# Patient Record
Sex: Male | Born: 1955
Health system: Southern US, Community
[De-identification: ages and names within clinical notes are randomized; demographics above are authoritative.]

## PROBLEM LIST (undated history)

## (undated) VITALS — BP 133/91 | HR 88 | Temp 97.3°F | Resp 18 | Ht 71.0 in | Wt 178.0 lb

## (undated) DIAGNOSIS — N200 Calculus of kidney: Secondary | ICD-10-CM

## (undated) DIAGNOSIS — G459 Transient cerebral ischemic attack, unspecified: Secondary | ICD-10-CM

## (undated) DIAGNOSIS — F32A Depression, unspecified: Secondary | ICD-10-CM

## (undated) DIAGNOSIS — R51 Headache: Secondary | ICD-10-CM

## (undated) DIAGNOSIS — F329 Major depressive disorder, single episode, unspecified: Secondary | ICD-10-CM

## (undated) HISTORY — PX: ROTATOR CUFF REPAIR: SHX139

## (undated) HISTORY — PX: HERNIA REPAIR: SHX51

---

## 2006-01-09 ENCOUNTER — Emergency Department (HOSPITAL_COMMUNITY): Admission: EM | Admit: 2006-01-09 | Discharge: 2006-01-09 | Payer: Self-pay | Admitting: Emergency Medicine

## 2009-04-14 ENCOUNTER — Encounter: Payer: Self-pay | Admitting: Family Medicine

## 2009-05-20 ENCOUNTER — Encounter: Payer: Self-pay | Admitting: Family Medicine

## 2009-05-20 LAB — CONVERTED CEMR LAB
Cholesterol: 199 mg/dL
HDL: 58 mg/dL
LDL Cholesterol: 136 mg/dL
PSA: 0.5 ng/mL

## 2009-07-08 ENCOUNTER — Emergency Department (HOSPITAL_COMMUNITY): Admission: EM | Admit: 2009-07-08 | Discharge: 2009-07-08 | Payer: Self-pay | Admitting: Family Medicine

## 2009-08-07 ENCOUNTER — Encounter: Payer: Self-pay | Admitting: Family Medicine

## 2009-08-12 ENCOUNTER — Ambulatory Visit: Payer: Self-pay | Admitting: Family Medicine

## 2009-08-12 DIAGNOSIS — D126 Benign neoplasm of colon, unspecified: Secondary | ICD-10-CM | POA: Insufficient documentation

## 2009-08-18 ENCOUNTER — Encounter: Payer: Self-pay | Admitting: Family Medicine

## 2009-08-19 ENCOUNTER — Encounter: Payer: Self-pay | Admitting: Family Medicine

## 2009-08-19 DIAGNOSIS — E7800 Pure hypercholesterolemia, unspecified: Secondary | ICD-10-CM | POA: Insufficient documentation

## 2009-09-07 ENCOUNTER — Telehealth: Payer: Self-pay | Admitting: *Deleted

## 2009-11-18 ENCOUNTER — Telehealth: Payer: Self-pay | Admitting: Family Medicine

## 2010-02-06 ENCOUNTER — Emergency Department (HOSPITAL_COMMUNITY)
Admission: EM | Admit: 2010-02-06 | Discharge: 2010-02-06 | Payer: Self-pay | Source: Home / Self Care | Admitting: Emergency Medicine

## 2010-02-12 ENCOUNTER — Encounter: Payer: Self-pay | Admitting: Family Medicine

## 2010-02-12 ENCOUNTER — Ambulatory Visit
Admission: RE | Admit: 2010-02-12 | Discharge: 2010-02-12 | Payer: Self-pay | Source: Home / Self Care | Attending: Family Medicine | Admitting: Family Medicine

## 2010-02-12 DIAGNOSIS — R079 Chest pain, unspecified: Secondary | ICD-10-CM | POA: Insufficient documentation

## 2010-02-15 LAB — URINALYSIS, ROUTINE W REFLEX MICROSCOPIC
Bilirubin Urine: NEGATIVE
Ketones, ur: NEGATIVE mg/dL
Leukocytes, UA: NEGATIVE
Nitrite: NEGATIVE
Protein, ur: NEGATIVE mg/dL
Specific Gravity, Urine: 1.008 (ref 1.005–1.030)
Urine Glucose, Fasting: NEGATIVE mg/dL
Urobilinogen, UA: 0.2 mg/dL (ref 0.0–1.0)
pH: 6 (ref 5.0–8.0)

## 2010-02-15 LAB — BASIC METABOLIC PANEL
BUN: 16 mg/dL (ref 6–23)
CO2: 24 mEq/L (ref 19–32)
Calcium: 9.6 mg/dL (ref 8.4–10.5)
Chloride: 103 mEq/L (ref 96–112)
Creatinine, Ser: 1.01 mg/dL (ref 0.4–1.5)
GFR calc Af Amer: >60 mL/min (ref >60)
GFR calc non Af Amer: >60 mL/min (ref >60)
Glucose, Bld: 108 mg/dL — ABNORMAL HIGH (ref 70–99)
Potassium: 4.2 mEq/L (ref 3.5–5.1)
Sodium: 137 mEq/L (ref 135–145)

## 2010-02-15 LAB — URINE CULTURE
Colony Count: NO GROWTH
Culture  Setup Time: 201201071423
Culture: NO GROWTH

## 2010-02-15 LAB — DIFFERENTIAL
Basophils Absolute: 0 10*3/uL (ref 0.0–0.1)
Basophils Relative: 1 % (ref 0–1)
Eosinophils Absolute: 0.3 10*3/uL (ref 0.0–0.7)
Eosinophils Relative: 6 % — ABNORMAL HIGH (ref 0–5)
Lymphocytes Relative: 24 % (ref 12–46)
Lymphs Abs: 1.2 10*3/uL (ref 0.7–4.0)
Monocytes Absolute: 0.7 10*3/uL (ref 0.1–1.0)
Monocytes Relative: 13 % — ABNORMAL HIGH (ref 3–12)
Neutro Abs: 2.9 10*3/uL (ref 1.7–7.7)
Neutrophils Relative %: 57 % (ref 43–77)

## 2010-02-15 LAB — CBC
HCT: 45.5 % (ref 39.0–52.0)
Hemoglobin: 15.5 g/dL (ref 13.0–17.0)
MCH: 30.1 pg (ref 26.0–34.0)
MCHC: 34.1 g/dL (ref 30.0–36.0)
MCV: 88.3 fL (ref 78.0–100.0)
Platelets: 181 10*3/uL (ref 150–400)
RBC: 5.15 MIL/uL (ref 4.22–5.81)
RDW: 12.5 % (ref 11.5–15.5)
WBC: 5.1 10*3/uL (ref 4.0–10.5)

## 2010-02-15 LAB — CONVERTED CEMR LAB: Direct LDL: 117 mg/dL — ABNORMAL HIGH

## 2010-02-15 LAB — URINE MICROSCOPIC-ADD ON

## 2010-03-02 NOTE — Miscellaneous (Signed)
Summary: ROI  ROI   Imported By: Knox Royalty 08/22/2009 13:32:16  _____________________________________________________________________  External Attachment:    Type:   Image     Comment:   External Document

## 2010-03-02 NOTE — Assessment & Plan Note (Signed)
Summary: NEW PT/PHYSICAL/tcb   Vital Signs:  Patient profile:   55 year old male Height:      70 inches Weight:      187.7 pounds BMI:     27.03 Temp:     97.0 degrees F Pulse rate:   73 / minute BP sitting:   127 / 85  Vitals Entered By: Starleen Blue RN 07/13/2009 CC: np, Headache Is Patient Diabetic? No Pain Assessment Patient in pain? no        CC:  np and Headache.  History of Present Illness: Records have been requested. Not here yet. Colonoscopy needs fu 04/14/09 where polyps were found.  Biopsies taken.  We don't have specifics.  Likely needs another colonoscopy for polyp removal.   Had persistant low back and abd pain which has since resolved and now is pain free.  Did have  1. Small cyst liver on both ultrasound and CT 2.  Two other liver lesions seen only on CT. 3. Cyst Rt. pole of kidney on CT but not seen on ultrasound.   Will repeat Abd CT in Oct which will be 6 months after the initial one.    Scalp lesion told previously eczema.  Just had PSA and chol sreening - will wait for record.   Habits & Providers  Alcohol-Tobacco-Diet     Alcohol drinks/day: 1     Alcohol Counseling: not indicated; patient does not drink     Tobacco Status: quit     Year Quit: 2008     Diet Comments: very healthy  Exercise-Depression-Behavior     Does Patient Exercise: yes     Exercise Counseling: 8 hours a day through work     Have you felt down or hopeless? no     Have you felt little pleasure in things? no  Current Medications (verified): 1)  Maxalt 10 Mg Tabs (Rizatriptan Benzoate) .... One By Mouth At Onset of Headache. 2)  Aspirin 81 Mg Chew (Aspirin) .... One Daily  Allergies (verified): No Known Drug Allergies  Past History:  Family History: Last updated: 08/12/2009 Ca,  (colon and breast), OHD.  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   Also has adult child from first marriage.  Risk Factors: Alcohol Use: 1 (08/12/2009) Diet: very healthy (08/12/2009) Exercise: yes (08/12/2009)  Risk Factors: Smoking Status: quit (08/12/2009)  Past Medical History: Previous history of mild depression.  Tried on med and stopped due to side effects.  Now under good control  Past Surgical History: Os trigonum resection Lt ankle Left thumb skin graft from accident.  Family History: Ca,  (colon and breast), OHD.  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  Also has adult child from first marriage.Smoking Status:  quit Does Patient Exercise:  yes  Review of Systems  The patient denies chest pain, syncope, dyspnea on exertion, peripheral edema, abdominal pain, melena, suspicious skin lesions, depression, and unusual weight change.    Physical Exam  Mouth:  Oral mucosa and oropharynx without lesions or exudates.  Teeth in good repair. Lungs:  Normal respiratory effort, chest expands symmetrically. Lungs are clear to auscultation, no crackles or wheezes. Heart:  Normal rate and regular rhythm. S1 and S2 normal without gallop, murmur, click, rub or other extra sounds. Msk:  No deformity or scoliosis noted  of thoracic or lumbar spine.   Extremities:  No clubbing, cyanosis, edema, or deformity noted with normal full range of motion of all joints.   Neurologic:  No cranial nerve deficits noted. Station and gait are normal. Plantar reflexes are down-going bilaterally. DTRs are symmetrical throughout. Sensory, motor and coordinative functions appear intact.   Impression & Recommendations:  Problem # 1:  COLONIC POLYPS (ICD-211.3) Await records before scheduling repeat colonoscopy  Problem # 2:  Preventive Health Care (ICD-V70.0) Will review tetanus, lipids, PSA etc when old records come in.  He is at risk for CAD based on FHx Recommend ASA  daily.  Complete Medication List: 1)  Maxalt 10 Mg Tabs (Rizatriptan benzoate) .... One by mouth at onset of headache. 2)  Aspirin 81 Mg Chew (Aspirin) .... One daily  Patient Instructions: 1)  Check to make sure maxalt is 10 mg 2)  You will need repeat CT of abdomen in Oct.  Call my office in Sept and I will arrange.  Plan to take ultrasound CD with you to radiology. 3)  We will wait for records on tetanus shot.   Prevention & Chronic Care Immunizations   Influenza vaccine: Not documented    Tetanus booster: Not documented    Pneumococcal vaccine: Not documented  Colorectal Screening   Hemoccult: Not documented    Colonoscopy: Not documented  Other Screening   PSA: Not documented   Smoking status: quit  (08/12/2009)  Lipids   Total Cholesterol: Not documented   LDL: Not documented   LDL Direct: Not documented   HDL: Not documented   Triglycerides: Not documented

## 2010-03-02 NOTE — Letter (Signed)
Summary: Generic Letter  [facility]  [address]   [place], [place] [address]   Phone: [phone]  Fax: [phone]    08/19/2009  Harry Nash [address] [place], [place]  [address]  Dear Harry Nash,   I got your outside records and tried to call without success.  Issues are: 1. After again reviewing the CT and ultrasound, I think repeating the CT in Oct (6 months after the original) as we planned is fine. I sincerely doubt that any of the findings will turn out to be medically important. 2. The records did not include any immunization records. 3. The colonoscopy and pathology findings are a bit complicated and I will be happy to discuss in person.  Bottom line, I think you should have a repeat colonoscopy in the spring (1 year from previous.) 4. Your bad cholesterol was a bit high.  Your calculated risk for having a heart attack is 5% risk over the next 10 years.  I think you would benefit from treating your cholesterol with medications.    Call me to discuss.  Sincerely,      Harry Albino MD

## 2010-03-02 NOTE — Progress Notes (Signed)
Summary: triage  Phone Note Call from Patient Call back at [phone]   Caller: spouse-Jennifer Summary of Call: persistant cough and wants him to come in today b/c of body aches  Initial call taken by: De Nurse,  November 18, 2009 1:53 PM  Follow-up for Phone Call        spoke with wife. he is c/o persistant cough x 1 month. has a tooth abcess. dentist is calling in an antibiotic. has had cold symptoms that have not gone away. non-productive. denies fever. states he does not want to go to md. told her we have no appts left for today but they can go to UC if she can convince him. she will either do that or call for appt tomorrow Follow-up by: Golden Circle RN,  November 18, 2009 2:05 PM  Additional Follow-up for Phone Call Additional follow up Details #1::        noted.  Additional Follow-up by: Doralee Albino MD,  November 18, 2009 4:59 PM

## 2010-03-02 NOTE — Procedures (Signed)
Summary: [employer] [facility] [place]   Imported By: Knox Royalty 08/22/2009 13:33:39  _____________________________________________________________________  External Attachment:    Type:   Image     Comment:   External Document

## 2010-03-02 NOTE — Progress Notes (Signed)
Summary: Rx Req  Phone Note Call from Patient Call back at Home Phone [phone]   Caller: Patient Summary of Call: Pt states he was to have a migraine medication called in for him when he was here.  Maxalt is the name of the rx.  The pharmacy is [facility]. Initial call taken by: Clydell Hakim,  September 07, 2009 8:46 AM  Follow-up for Phone Call        Rx sent Follow-up by: Doralee Albino MD,  September 07, 2009 7:41 PM  Additional Follow-up for Phone Call Additional follow up Details #1::        Spoke with patient and informed him that [facility] Additional Follow-up by: Jimmy Footman, CMA,  September 08, 2009 8:37 AM    Prescriptions: MAXALT 10 MG TABS (RIZATRIPTAN BENZOATE) one by mouth at onset of headache.  #10 x 6   Entered and Authorized by:   Doralee Albino MD   Signed by:   Doralee Albino MD on 09/07/2009   Method used:   [facility]        [facility] [address] [phone]* (retail)       [address]       [place], [place]  [address]       Ph: [phone] or [phone]       Fax: [phone]   RxID:   [id number]

## 2010-03-02 NOTE — Letter (Signed)
Summary: [facility] [place] [facility]   Imported By: Clydell Hakim 08/28/2009 15:11:38  _____________________________________________________________________  External Attachment:    Type:   Image     Comment:   External Document

## 2010-03-02 NOTE — Miscellaneous (Signed)
Summary: Outside records  Clinical Lists Changes Due to complexity of colonoscopy and path report, will scan to chart.  As best I can tell, the biopsy of the ascending colon reveiling a tubular adenoma was from an ulcer rather than a polyp.  As such, there is no obvious polyp that currently needs removal.  Also, no dysplasia mentioned on the polyp.    Also, LDL is mildly elevated at 694.  His current 10 year cardiovascular risk is 5%.   Finally, will also scan recent CT and ultrasound of abd reports.  That radiologist recommended FU of two very small hepatic lesions seen only on CT in 1-2 months.  This seems to be an overly agressive recommendation and I will repeat scan in 6 months as initially planned. (Oct 2011) Observations: Added new observation of DM PROGRESS: N/A (08/18/2009 17:05) Added new observation of DM FSREVIEW: N/A (08/18/2009 17:05) Added new observation of HTN PROGRESS: N/A (08/18/2009 17:05) Added new observation of HTN FSREVIEW: N/A (08/18/2009 17:05) Added new observation of LIPID PROGRS: N/A (08/18/2009 17:05) Added new observation of LIPID FSREVW: N/A (08/18/2009 17:05) Added new observation of LDL: 136 mg/dL (85/46/2703 50:09) Added new observation of HDL: 58 mg/dL (38/18/2993 71:69) Added new observation of CHOLESTEROL: 199 mg/dL (67/89/3810 17:51) Added new observation of PSA: 0.5 ng/mL (05/20/2009 17:05) Added new observation of COLONOSCOPY: Results: Polyp.  Biopsy showed  Ascending colon tubular adenoma  7mm in ascending colon Sigmoid and rectum hyperplastic polyps diverticulosis  (04/14/2009 17:20)      Prevention & Chronic Care Immunizations   Influenza vaccine: Not documented    Tetanus booster: Not documented    Pneumococcal vaccine: Not documented  Colorectal Screening   Hemoccult: Not documented    Colonoscopy: Results: Polyp.  Biopsy showed  Ascending colon tubular adenoma  7mm in ascending colon Sigmoid and rectum hyperplastic  polyps diverticulosis  (04/14/2009)  Other Screening   PSA: 0.5  (05/20/2009)   Smoking status: quit  (08/12/2009)  Lipids   Total Cholesterol: 199  (05/20/2009)   LDL: 136  (05/20/2009)   LDL Direct: Not documented   HDL: 58  (05/20/2009)   Triglycerides: Not documented    Colonoscopy  Procedure date:  04/14/2009  Findings:      Results: Polyp.  Biopsy showed  Ascending colon tubular adenoma  7mm in ascending colon Sigmoid and rectum hyperplastic polyps diverticulosis   Colonoscopy  Procedure date:  04/14/2009  Findings:      Results: Polyp.  Biopsy showed  Ascending colon tubular adenoma  7mm in ascending colon Sigmoid and rectum hyperplastic polyps diverticulosis  Appended Document: Outside records    Clinical Lists Changes  Problems: Changed problem from COLONIC POLYPS (ICD-211.3) to COLONIC POLYPS, ADENOMATOUS (ICD-211.3) Added new problem of HYPERCHOLESTEROLEMIA (ICD-272.0) Observations: Added new observation of HEMOCCRECACT: Not indicated (08/19/2009 9:57) Added new observation of COLONNXTDUE: 04/15/2010 (08/19/2009 9:57) Added new observation of DM PROGRESS: N/A (08/19/2009 9:57) Added new observation of DM FSREVIEW: N/A (08/19/2009 9:57) Added new observation of HTN PROGRESS: N/A (08/19/2009 9:57) Added new observation of HTN FSREVIEW: N/A (08/19/2009 9:57)       Prevention & Chronic Care Immunizations   Influenza vaccine: Not documented    Tetanus booster: Not documented    Pneumococcal vaccine: Not documented  Colorectal Screening   Hemoccult: Not documented   Hemoccult action/deferral: Not indicated  (08/19/2009)    Colonoscopy: Results: Polyp.  Biopsy showed  Ascending colon tubular adenoma  7mm in ascending colon Sigmoid and rectum hyperplastic polyps diverticulosis  (04/14/2009)  Colonoscopy due: 04/15/2010  Other Screening   PSA: 0.5  (05/20/2009)   Smoking status: quit  (08/12/2009)  Lipids   Total Cholesterol:  199  (05/20/2009)   LDL: 136  (05/20/2009)   LDL Direct: Not documented   HDL: 58  (05/20/2009)   Triglycerides: Not documented    SGOT (AST): Not documented   SGPT (ALT): Not documented   Alkaline phosphatase: Not documented   Total bilirubin: Not documented  Self-Management Support :    Lipid self-management support: Not documented

## 2010-03-04 NOTE — Letter (Signed)
Summary: *Referral Letter  [facility]  [address]   [place], [place] [address]   Phone: [phone]  Fax: [phone]    02/12/2010  Thank you in advance for agreeing to see my patient:  Harry Nash [address] [place], [place]  [address]  Phone: [phone]  Reason for Referral: Needs repeat colonoscopy.  Procedures Requested: Mildly confusing previous colonoscopy - copies attached.  Also had abnormal CT of abd.  Recent CT when he had kidney stones showed: Adrenal adenoma and Renal cyst stable Possible liver lesions and sigmoid thickening not present. Given previous colonoscopy findings feel repeat test at this time appropriate.  Current Medical Problems: 1)  CHEST PAIN, UNSPECIFIED (ICD-786.50) 2)  HYPERCHOLESTEROLEMIA (ICD-272.0) 3)  COLONIC POLYPS, ADENOMATOUS (ICD-211.3)   Current Medications: 1)  MAXALT 10 MG TABS (RIZATRIPTAN BENZOATE) one by mouth at onset of headache. 2)  ASPIRIN 81 MG CHEW (ASPIRIN) one daily   Past Medical History: 1)  Previous history of mild depression.  Tried on med and stopped due to side effects.  Now under good control    Thank you again for agreeing to see our patient; please contact us if you have any further questions or need additional information.  Sincerely,    Doralee Albino MD

## 2010-03-04 NOTE — Assessment & Plan Note (Signed)
Summary: f/u test results/eo   Vital Signs:  Patient profile:   55 year old male Height:      70 inches Weight:      186.7 pounds BMI:     26.89 Temp:     97.7 degrees F oral Pulse rate:   84 / minute BP sitting:   129 / 80  (left arm) Cuff size:   regular  Vitals Entered By: Jimmy Footman, CMA (February 12, 2010 10:24 AM) CC: CAT scan, colonscopy Is Patient Diabetic? No Pain Assessment Patient in pain? no      Comments In ED last week for kidney stone   CC:  CAT scan and colonscopy.  History of Present Illness: Had abnormal colonoscopy and CT per outside records - see historic records scanned 04/14/09  Old CT showed Rt kidney cyst, left adrenal adenoma, question liver lesions and question sigmoid thickening.   Was seen in ER recently for kidney stone and had CT abd pelvis.  Kidney and adrenal lesions were unchanged (even compared to our old CT of 2007) and liver/sigmoid colon were normal.  Needs further CT follow up.  Still needs repeat colonoscopy for polyps.  He is asymptomatic.    Needs repeat chol has improved diet  Has intrascapular back/chest discomfort for months.  Old smoker, now quit.    Habits & Providers  Alcohol-Tobacco-Diet     Tobacco Status: quit  Current Medications (verified): 1)  Maxalt 10 Mg Tabs (Rizatriptan Benzoate) .... One By Mouth At Onset of Headache. 2)  Aspirin 81 Mg Chew (Aspirin) .... One Daily  Allergies (verified): No Known Drug Allergies  Physical Exam  General:  Well-developed,well-nourished,in no acute distress; alert,appropriate and cooperative throughout examination Chest Wall:  no palpable tenderness Lungs:  Normal respiratory effort, chest expands symmetrically. Lungs are clear to auscultation, no crackles or wheezes. Heart:  Normal rate and regular rhythm. S1 and S2 normal without gallop, murmur, click, rub or other extra sounds.   Impression & Recommendations:  Problem # 1:  CHEST PAIN, UNSPECIFIED (ICD-786.50) Check  CXR Orders: CXR- 2view (CXR) [facility]- Est  Level 4 ([address])  Problem # 2:  HYPERCHOLESTEROLEMIA (ICD-272.0) Recheck LDL to see if lifestyle modifications have worked. Orders: Direct LDL-[facility] ([address]11) [facility]- Est  Level 4 ([address])  Problem # 3:  COLONIC POLYPS, ADENOMATOUS (ICD-211.3) Refer for repeat colonoscopy Orders: Gastroenterology Referral (GI) [facility]- Est  Level 4 ([address])  Complete Medication List: 1)  Maxalt 10 Mg Tabs (Rizatriptan benzoate) .... One by mouth at onset of headache. 2)  Aspirin 81 Mg Chew (Aspirin) .... One daily   Orders Added: 1)  CXR- 2view [CXR] 2)  Direct LDL-[facility] [[address]] 3)  Gastroenterology Referral [GI] 4)  [facility]- Est  Level 4 [[address]]

## 2010-09-15 ENCOUNTER — Telehealth: Payer: Self-pay | Admitting: Family Medicine

## 2010-09-15 NOTE — Telephone Encounter (Signed)
Received this e mail:  I hope this note finds you well, and that you've had a good summer.  I received your email address under special circumstances last winter, so if there is a better person to whom I should direct this inquiry please let me know, and I will write to them instead.  When our family moved back to [place] last year, there were several unresolved medical issues concerning my husband, Harry Nash (DOB 01/17/1956).  I know that he was supposed to have a repeat colonoscopy to remove a polyp-like growth that was missed the first time, and I believe perhaps there was another scan that was supposed to be repeated as well (or two--maybe abdominal and lungs?).  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  Is there any way you could review his file and send [place] the specifics of everything that needs to be taken care of?  I know in the past you've sent a letter.  If I dig the old letters out of the files, I am concerned that I am going to miss something.  We would also like to arrange for the necessary tests/labs in the near future.  Again, don't hesitate to let me know if there is someone else I should write.  I know you are a busy man!  I replied: OK, let me make try to cover all the bases.  First, I believe I referred him for colonoscopy back in January.  Did he get that done or do I need to refresh that referral?  Second, I think we are good on the CT scans.  We repeated one in Jan 2012 which the radiologists were able to compare to one done in [place] in 2007.  He had a few minor things - all of which appeared benign.  Nothing was seen that warranted another CT.  Here is a copy of the report. Comparison: 01/09/2006  Findings: There are no renal or ureteral calculi.  The liver, spleen, pancreas, the right adrenal gland, and left  kidney are normal.  There is a 13  mm low density lesion in the upper pole of the right  kidney consistent with a cyst, slightly larger than on the prior  exam. There is a stable 2.2 cm adenoma in the left adrenal gland.  The bowel appears normal including the terminal ileum and appendix.  There is no free fluid or diverticular disease. Phleboliths are  present in the pelvis. There is calcification in the prostate  gland.  The patient has moderate facet joint arthritis at L4-5 on the  right.  IMPRESSION:  No acute abnormalities of the abdomen and pelvis. Moderate right  facet joint arthritis at L4-5.   He is due for a repeat cholesterol, a PSA (prostate cancer blood test), and a check up.  If you want, I can order the blood work to be drawn so I'll have the results when he sees me.   Let me know so that I can put in the orders for the blood work and GI referral for colonoscopy.

## 2010-09-20 ENCOUNTER — Telehealth: Payer: Self-pay | Admitting: Family Medicine

## 2010-09-20 DIAGNOSIS — D126 Benign neoplasm of colon, unspecified: Secondary | ICD-10-CM

## 2010-09-20 DIAGNOSIS — Z125 Encounter for screening for malignant neoplasm of prostate: Secondary | ICD-10-CM | POA: Insufficient documentation

## 2010-09-20 DIAGNOSIS — E7800 Pure hypercholesterolemia, unspecified: Secondary | ICD-10-CM

## 2010-09-20 NOTE — Assessment & Plan Note (Signed)
Never had colonoscopy.  Order reentered.

## 2010-09-20 NOTE — Assessment & Plan Note (Signed)
Will come in for blood work prior to physical.

## 2010-09-20 NOTE — Telephone Encounter (Signed)
I had the following e mail correspondence with patient's wife:  Thank you so much for all your work on this.  We are very appreciative. Yes, please order all the blood work and the colonoscopy referral.  (He never had that done.)  I will call and make an appointment for the checkup.  De Nurse On Wed, Sep 15, 2010 at 9:53 AM, Larenda Reedy, [email or web address]> wrote: OK, let me make try to cover all the bases.   First, I believe I referred him for colonoscopy back in January.  Did he get that done or do I need to refresh that referral?   Second, I think we are good on the CT scans.  We repeated one in Jan 2012 which the radiologists were able to compare to one done in [place] in 2007.  He had a few minor things - all of which appeared benign.  Nothing was seen that warranted another CT.  Here is a copy of the report. Comparison: 01/09/2006  Findings: There are no renal or ureteral calculi.  The liver, spleen, pancreas, the right adrenal gland, and left  kidney are normal.  There is a 13 mm low density lesion in the upper pole of the right  kidney consistent with a cyst, slightly larger than on the prior  exam. There is a stable 2.2 cm adenoma in the left adrenal gland.  The bowel appears normal including the terminal ileum and appendix.  There is no free fluid or diverticular disease. Phleboliths are  present in the pelvis. There is calcification in the prostate  gland.  The patient has moderate facet joint arthritis at L4-5 on the  right.  IMPRESSION:  No acute abnormalities of the abdomen and pelvis. Moderate right  facet joint arthritis at L4-5.   He is due for a repeat cholesterol, a PSA (prostate cancer blood test), and a check up.  If you want, I can order the blood work to be drawn so I'll have the results when he sees me.    Let me know so that I can put in the orders for the blood work and GI referral for colonoscopy.       Laiden Milles A. Leveda Anna, MD Director,  [facility] Medicine Residency Program Professor,  [facility], Dept of [email or web address]  [phone] Fax- [phone] Digital pager [phone]   From: [email or web address] [[email or web address]] On [employer] Aundria Rud  Sent: Monday, September 13, 2010 2:30 PM To: Chasidy Janak, Bill Subject: Re: follow up   Dr. Leveda Anna,  I hope this note finds you well, and that you've had a good summer.  I received your email address under special circumstances last winter, so if there is a better person to whom I should direct this inquiry please let me know, and I will write to them instead.  When our family moved back to [place] last year, there were several unresolved medical issues concerning my husband, Harry Nash (DOB 11/26/1955).  I know that he was supposed to have a repeat colonoscopy to remove a polyp-like growth that was missed the first time, and I believe perhaps there was another scan that was supposed to be repeated as well (or two--maybe abdominal and lungs?).  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  Is there any way you could review his file and send [place] the specifics of everything that needs to  be taken care of?  I know in the past you've sent a letter.  If I dig the old letters out of the files, I am concerned that I am going to miss something.  We would also like to arrange for the necessary tests/labs in the near future.  Again, don't hesitate to let me know if there is someone else I should write.  I know you are a busy man!  Best,  Rexene Agent [phone]

## 2010-09-20 NOTE — Assessment & Plan Note (Signed)
Will get blood work prior to physical

## 2010-09-21 NOTE — Telephone Encounter (Signed)
Sent referral information to [facility] [phone].Laureen Ochs, Viann Shove

## 2010-10-11 ENCOUNTER — Telehealth: Payer: Self-pay | Admitting: *Deleted

## 2010-10-11 NOTE — Telephone Encounter (Signed)
Spoke with pt and he informed me that he got the days mixed up and he has an appt on Thursday.Laureen Ochs, Viann Shove

## 2010-11-06 ENCOUNTER — Telehealth: Payer: Self-pay | Admitting: Family Medicine

## 2010-11-06 MED ORDER — RIZATRIPTAN BENZOATE 10 MG PO TABS: 10.0000 mg | ORAL_TABLET | ORAL | Status: DC | PRN

## 2010-11-06 NOTE — Telephone Encounter (Signed)
Patient having a migraine and his rx for maxalt is expired. Family has been in upheaval since a recent move. Has not been seen in clinic in 9 months. Discuss that we do not refill medications over the phone usually. Since this is an existing problem and maxalt works in the past, will send a refill tonight to [facility]. Emphasized he needs a follow up appt in clinic in next few weeks.

## 2010-11-08 ENCOUNTER — Other Ambulatory Visit: Payer: Self-pay | Admitting: Family Medicine

## 2010-11-08 DIAGNOSIS — G43909 Migraine, unspecified, not intractable, without status migrainosus: Secondary | ICD-10-CM | POA: Insufficient documentation

## 2010-11-08 MED ORDER — RIZATRIPTAN BENZOATE 10 MG PO TABS: 10.0000 mg | ORAL_TABLET | ORAL | Status: DC | PRN

## 2010-11-08 NOTE — Telephone Encounter (Signed)
Refilled after fax request

## 2010-11-11 ENCOUNTER — Ambulatory Visit (HOSPITAL_COMMUNITY): Admission: RE | Admit: 2010-11-11 | Payer: Self-pay | Source: Ambulatory Visit | Admitting: Gastroenterology

## 2010-12-22 ENCOUNTER — Other Ambulatory Visit: Payer: Self-pay | Admitting: Family Medicine

## 2010-12-22 NOTE — Telephone Encounter (Signed)
Refill request

## 2011-01-13 ENCOUNTER — Ambulatory Visit (INDEPENDENT_AMBULATORY_CARE_PROVIDER_SITE_OTHER): Payer: BC Managed Care – PPO | Admitting: Family Medicine

## 2011-01-13 VITALS — BP 126/75 | HR 65 | Temp 98.0°F | Ht 70.0 in | Wt 185.0 lb

## 2011-01-13 DIAGNOSIS — T148XXA Other injury of unspecified body region, initial encounter: Secondary | ICD-10-CM

## 2011-01-13 MED ORDER — CYCLOBENZAPRINE HCL 10 MG PO TABS: 10.0000 mg | ORAL_TABLET | Freq: Three times a day (TID) | ORAL | Status: DC | PRN

## 2011-01-13 NOTE — Patient Instructions (Signed)
I think you pulled a muscle in your back.  Try the flexeril for back pain/muscle spasms. If you continue to have persistent pain, you may take an extra naproxen or ibuprofen.   Also try warm compresses on your back and try those stretching exercises (but gently).   If the pain does not improve in the next 2 weeks, please return to the clinic to be re-evaluated.

## 2011-01-13 NOTE — Progress Notes (Signed)
  Subjective:    Patient ID: Harry Nash, male    DOB: Nov 20, 1955, 55 y.o.   MRN: [id number]  HPI Back pain for the past 2 weeks  Instigated by: was moving heavy furniture and thinks pulled back muscle. Pain started at that time.  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 (not able to properly recuperate) Alleviated by: hydrocodone (has had leftover and has been taking up to 2 a day as needed) also tried naproxen; both help some Exacerbated by: sitting upright and bending forward Associated symptoms: none    Review of Systems Denies nausea/vomiting Denies chest pain Denies difficulty breathing Denies dysuria Denies lightheadedness Denies cough, fevers, feeling ill Denies numbness    Objective:   Physical Exam Gen: NAD Psych: pleasant, engaged, appropriate, articulate Pulm: CTAB Back: no tenderness throughout Abd:    Mild tenderness to palpation left paraspinous muscle and wrapping forward under ribs   NABS, soft, NT, ND, no spleen palpable Neuro: grossly intact; normal gait; sensation intact throughout; good ROM with flexion, extension, and lateral flexion    Assessment & Plan:

## 2011-01-13 NOTE — Assessment & Plan Note (Signed)
Following moving heavy furniture around. Flexeril and NSAIDs prn, heating pads. RTC 2 weeks if symptoms not improved.

## 2011-01-14 ENCOUNTER — Telehealth: Payer: Self-pay | Admitting: Family Medicine

## 2011-03-15 ENCOUNTER — Encounter: Payer: Self-pay | Admitting: Family Medicine

## 2011-03-15 DIAGNOSIS — D126 Benign neoplasm of colon, unspecified: Secondary | ICD-10-CM

## 2011-03-15 NOTE — Progress Notes (Signed)
  Subjective:    Patient ID: Harry Nash, male    DOB: [birth year], 56 y.o.   MRN: [id number]  HPI Received office notes and path report from Dr. Laural Benes of [facility] from colonoscopy on 03/07/11.  Has small rectal polyp on path hyperplastic.  Multiple diverticulum.    Review of Systems     Objective:   Physical Exam        Assessment & Plan:

## 2011-07-01 ENCOUNTER — Encounter (HOSPITAL_COMMUNITY): Payer: Self-pay | Admitting: Emergency Medicine

## 2011-07-01 ENCOUNTER — Emergency Department (HOSPITAL_COMMUNITY): Payer: BC Managed Care – PPO

## 2011-07-01 ENCOUNTER — Observation Stay (HOSPITAL_COMMUNITY)
Admission: EM | Admit: 2011-07-01 | Discharge: 2011-07-02 | DRG: 143 | Disposition: A | Discharge status: Home / Self Care | Payer: BC Managed Care – PPO | Attending: Cardiology | Admitting: Cardiology

## 2011-07-01 DIAGNOSIS — I200 Unstable angina: Secondary | ICD-10-CM

## 2011-07-01 DIAGNOSIS — Z87891 Personal history of nicotine dependence: Secondary | ICD-10-CM | POA: Insufficient documentation

## 2011-07-01 DIAGNOSIS — R079 Chest pain, unspecified: Principal | ICD-10-CM | POA: Diagnosis present

## 2011-07-01 DIAGNOSIS — Z8249 Family history of ischemic heart disease and other diseases of the circulatory system: Secondary | ICD-10-CM | POA: Insufficient documentation

## 2011-07-01 LAB — CBC
HCT: 42.3 % (ref 39.0–52.0)
Hemoglobin: 14.2 g/dL (ref 13.0–17.0)
MCH: 29.4 pg (ref 26.0–34.0)
MCHC: 33.6 g/dL (ref 30.0–36.0)
MCV: 87.6 fL (ref 78.0–100.0)
Platelets: 200 10*3/uL (ref 150–400)
RBC: 4.83 MIL/uL (ref 4.22–5.81)
RDW: 13.1 % (ref 11.5–15.5)
WBC: 6.8 10*3/uL (ref 4.0–10.5)

## 2011-07-01 LAB — DIFFERENTIAL
Basophils Absolute: 0 10*3/uL (ref 0.0–0.1)
Basophils Relative: 0 % (ref 0–1)
Eosinophils Absolute: 0.2 10*3/uL (ref 0.0–0.7)
Eosinophils Relative: 3 % (ref 0–5)
Lymphocytes Relative: 31 % (ref 12–46)
Lymphs Abs: 2.1 10*3/uL (ref 0.7–4.0)
Monocytes Absolute: 0.9 10*3/uL (ref 0.1–1.0)
Monocytes Relative: 14 % — ABNORMAL HIGH (ref 3–12)
Neutro Abs: 3.5 10*3/uL (ref 1.7–7.7)
Neutrophils Relative %: 52 % (ref 43–77)

## 2011-07-01 LAB — BASIC METABOLIC PANEL
BUN: 24 mg/dL — ABNORMAL HIGH (ref 6–23)
CO2: 26 mEq/L (ref 19–32)
Calcium: 9.4 mg/dL (ref 8.4–10.5)
Chloride: 103 mEq/L (ref 96–112)
Creatinine, Ser: 0.89 mg/dL (ref 0.50–1.35)
GFR calc Af Amer: >90 mL/min (ref >90)
GFR calc non Af Amer: >90 mL/min (ref >90)
Glucose, Bld: 99 mg/dL (ref 70–99)
Potassium: 4.1 mEq/L (ref 3.5–5.1)
Sodium: 139 mEq/L (ref 135–145)

## 2011-07-01 NOTE — ED Notes (Signed)
Pt alert, nad, c/o chest pain, tightness, numbness to arms, resp even unlabored, skin pwd, denies n/v, described as "dull and unusual"

## 2011-07-01 NOTE — ED Notes (Signed)
Pt presents to ED with c/o chest pain that began around 9pm tonight. Pt describes pain as tight and pulling. Pt states his arms with "tingling" during episode. Pt states pain has subsided however tingling is still mildly present in both hands. Pt currently c/o indigestion.

## 2011-07-01 NOTE — ED Provider Notes (Signed)
History     CSN: [id number]  Arrival date & time 07/01/11  2130   First MD Initiated Contact with Patient 07/01/11 2200      Chief Complaint  Patient presents with  . Chest Pain  . Shortness of Breath    (Consider location/radiation/quality/duration/timing/severity/associated sxs/prior treatment) Patient is a 56 y.o. male presenting with chest pain. The history is provided by the patient.  Chest Pain The chest pain began 1 - 2 hours ago. Duration of episode(s) is 45 minutes. Chest pain occurs constantly. The chest pain is improving. At its most intense, the pain is at 8/10. The pain is currently at 1/10. The severity of the pain is moderate. The quality of the pain is described as aching (uncomfortable feeling across the chest and made his arms feel heavy). Radiates to: bilateral upper arms. Exacerbated by: nothing. Primary symptoms include shortness of breath. Pertinent negatives for primary symptoms include no fever, no cough, no wheezing, no palpitations, no abdominal pain, no nausea and no vomiting.  Associated symptoms include diaphoresis.  Pertinent negatives for associated symptoms include no weakness. He tried aspirin for the symptoms. Risk factors include no known risk factors.  Pertinent negatives for past medical history include no hyperlipidemia and no hypertension.  His family medical history is significant for CAD in family and early MI in family.  Procedure history is negative for cardiac catheterization.     History reviewed. No pertinent past medical history.  History reviewed. No pertinent past surgical history.  No family history on file.  History  Substance Use Topics  . Smoking status: Former Games developer  . Smokeless tobacco: Not on file  . Alcohol Use: No      Review of Systems  Constitutional: Positive for diaphoresis. Negative for fever.  Respiratory: Positive for shortness of breath. Negative for cough and wheezing.   Cardiovascular: Positive for chest  pain. Negative for palpitations.  Gastrointestinal: Negative for nausea, vomiting and abdominal pain.  Neurological: Negative for weakness.  All other systems reviewed and are negative.    Allergies  Review of patient's allergies indicates no known allergies.  Home Medications   Current Outpatient Rx  Name Route Sig Dispense Refill  . ASPIRIN 325 MG PO TABS Oral Take 325 mg by mouth every 4 (four) hours as needed. Pain    . ASPIRIN 81 MG PO CHEW Oral Chew 81 mg by mouth daily.      Marland Kitchen MAXALT 10 MG PO TABS  TAKE 1 TABLET BY MOUTH AS NEEDED FOR ONSET OF HEADACHE MAXIMUM OF 2 TABLETS PER DAY 10 tablet 6    BP 138/83  Pulse 78  Temp 98 F (36.7 C)  Resp 16  SpO2 97%  Physical Exam  Nursing note and vitals reviewed. Constitutional: He is oriented to person, place, and time. He appears well-developed and well-nourished. No distress.  HENT:  Head: Normocephalic and atraumatic.  Mouth/Throat: Oropharynx is clear and moist.  Eyes: Conjunctivae and EOM are normal. Pupils are equal, round, and reactive to light.  Neck: Normal range of motion. Neck supple.  Cardiovascular: Normal rate, regular rhythm and intact distal pulses.   No murmur heard. Pulmonary/Chest: Effort normal and breath sounds normal. No respiratory distress. He has no wheezes. He has no rales.  Abdominal: Soft. He exhibits no distension. There is no tenderness. There is no rebound and no guarding.  Musculoskeletal: Normal range of motion. He exhibits no edema and no tenderness.  Neurological: He is alert and oriented to person, place, and  time.  Skin: Skin is warm and dry. No rash noted. No erythema.  Psychiatric: He has a normal mood and affect. His behavior is normal.    ED Course  Procedures (including critical care time)  Labs Reviewed  DIFFERENTIAL - Abnormal; Notable for the following:    Monocytes Relative 14 (*)    All other components within normal limits  BASIC METABOLIC PANEL - Abnormal; Notable for  the following:    BUN 24 (*)    All other components within normal limits  CARDIAC PANEL(CRET KIN+CKTOT+MB+TROPI) - Abnormal; Notable for the following:    Total CK 450 (*)    All other components within normal limits  CBC   Dg Chest 2 View  07/01/2011  *RADIOLOGY REPORT*  Clinical Data: Chest pressure.  Shortness of breath.  CHEST - 2 VIEW  Comparison: None.  Findings: Normal sized heart.  Clear lungs with normal vascularity. Upper lumbar spine degenerative changes.  Minimal scoliosis.  IMPRESSION: No acute abnormality.  Original Report Authenticated By: Darrol Angel, M.D.     Date: 07/01/2011  Rate: 75  Rhythm: normal sinus rhythm  QRS Axis: normal  Intervals: normal  ST/T Wave abnormalities: normal  Conduction Disutrbances: none  Narrative Interpretation: unremarkable     1. Unstable angina       MDM   Pt with symptoms concerning for ACS with pain that radiated to bilateral arms and tightness throughout his chest that lasted for 45 minutes.  TIMI 0 but has a strong family history with a father dying in his 51s of an MI in his brother and mother both having MIs and cardiac disease. Associated symptoms include shortness of breath, diaphoresis. Patient took 325 of aspirin prior to coming in on arrival here his symptoms had resolved. EKG wnl, CXR, CBC, BMP, CE, Coags pending.  12:10 AM All initial labs are within normal limits. Spoke with cardiology and they agreed that he needs further evaluation and a stress test.      Gwyneth Sprout, MD 07/02/11 0011

## 2011-07-01 NOTE — ED Notes (Signed)
Bed:WA12<BR> Expected date:<BR> Expected time:<BR> Means of arrival:<BR> Comments:<BR> Triage 1

## 2011-07-02 ENCOUNTER — Encounter (HOSPITAL_COMMUNITY): Payer: Self-pay | Admitting: *Deleted

## 2011-07-02 DIAGNOSIS — R079 Chest pain, unspecified: Secondary | ICD-10-CM

## 2011-07-02 LAB — CARDIAC PANEL(CRET KIN+CKTOT+MB+TROPI)
CK, MB: 6.3 ng/mL (ref 0.3–4.0)
CK, MB: 4.9 ng/mL — ABNORMAL HIGH (ref 0.3–4.0)
Relative Index: 1.4 (ref 0.0–2.5)
Relative Index: 1.5 (ref 0.0–2.5)
Total CK: 450 U/L — ABNORMAL HIGH (ref 7–232)
Troponin I: <0.3 ng/mL (ref <0.30)
Troponin I: <0.3 ng/mL (ref <0.30)
Troponin I: <0.3 ng/mL (ref <0.30)

## 2011-07-02 LAB — CBC
HCT: 43.8 % (ref 39.0–52.0)
Hemoglobin: 14.4 g/dL (ref 13.0–17.0)
MCH: 28.9 pg (ref 26.0–34.0)
RBC: 4.99 MIL/uL (ref 4.22–5.81)

## 2011-07-02 LAB — BASIC METABOLIC PANEL
Chloride: 104 mEq/L (ref 96–112)
GFR calc Af Amer: >90 mL/min (ref >90)
GFR calc non Af Amer: >90 mL/min (ref >90)
Glucose, Bld: 96 mg/dL (ref 70–99)
Potassium: 3.6 mEq/L (ref 3.5–5.1)
Sodium: 138 mEq/L (ref 135–145)

## 2011-07-02 LAB — LIPID PANEL
Cholesterol: 178 mg/dL (ref 0–200)
HDL: 54 mg/dL (ref >39)
LDL Cholesterol: 111 mg/dL — ABNORMAL HIGH (ref 0–99)
Triglycerides: 65 mg/dL (ref <150)

## 2011-07-02 MED ORDER — NITROGLYCERIN 0.4 MG SL SUBL: 0.4000 mg | SUBLINGUAL_TABLET | SUBLINGUAL | Status: DC | PRN

## 2011-07-02 MED ORDER — SUMATRIPTAN SUCCINATE 100 MG PO TABS
100.0000 mg | ORAL_TABLET | Freq: Every day | ORAL | Status: DC | PRN
  Filled 2011-07-02: qty 1

## 2011-07-02 MED ORDER — ASPIRIN EC 81 MG PO TBEC: 81.0000 mg | DELAYED_RELEASE_TABLET | Freq: Every day | ORAL | Status: DC

## 2011-07-02 MED ORDER — ENOXAPARIN SODIUM 40 MG/0.4ML ~~LOC~~ SOLN
40.0000 mg | SUBCUTANEOUS | Status: DC
  Administered 2011-07-02: 40 mg via SUBCUTANEOUS
  Filled 2011-07-02 (×2): qty 0.4

## 2011-07-02 MED ORDER — ONDANSETRON HCL 4 MG/2ML IJ SOLN: 4.0000 mg | Freq: Four times a day (QID) | INTRAMUSCULAR | Status: DC | PRN

## 2011-07-02 MED ORDER — ACETAMINOPHEN 325 MG PO TABS
650.0000 mg | ORAL_TABLET | ORAL | Status: DC | PRN
  Administered 2011-07-02: 650 mg via ORAL
  Filled 2011-07-02: qty 2

## 2011-07-02 NOTE — ED Notes (Signed)
Lab called with critical MB of 6.3. Dr. Anitra Lauth notified of result.

## 2011-07-02 NOTE — Discharge Summary (Signed)
Patient ID: Harry Nash MRN: [id number] DOB/AGE: 1955/08/04 56 y.o.  Admit date: 07/01/2011 Discharge date: 07/02/2011  Primary Discharge Diagnosis: Chest pain - he is never seen a cardiologist, Dr. Leveda Anna is his primary physician: Family medicine Secondary Discharge Diagnosis: Family history of coronary artery disease, prior tobacco use 6 years ago quit.   Significant Diagnostic Studies: Echocardiogram with normal ejection fraction, no wall motion abnormality, trivial AI, trivial MR, no paracardial effusion-personally viewed  Hospital Course: 56 year old male whose father had a heart attack at age 61, crit tobacco use 6 years ago who denies hypertension, hyperlipidemia, diabetes who was in his usual state of health when yesterday at approximately 9 PM last night was reading a book to his son and developed chest tightness/squeezing with some radiation to his neck with tingling in both of his hands bilaterally, lightheadedness, anxious. His wife encouraged him to go to the emergency room. At approximately 10 PM his pain was gone. EKG was unremarkable, troponin was negative. 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  His echocardiogram was just recently performed and I personally viewed it and it does not appear to have any wall motion abnormality, normal ejection fraction. He is currently without chest pain. Feels well. I inquired about obtaining a nuclear stress test here in the hospital this morning, Saturday morning, however our window of opportunity has closed and they would not be able to perform this until tomorrow. I discussed this with he and his wife and given his negative troponin, echocardiogram is reassuring, EKG that is reassuring I will set him up for stress test as an outpatient in an expeditious manner.  Interestingly, his CK and MB were mildly elevated but his troponin were normal. This is likely a  result of his heavy manual labor yesterday and mild muscle breakdown. CK currently is 371, MB is 5.2. BNP is normal at 28. His LDL cholesterol is 111, HDL is 54. Hemoglobin is 14.4. Creatinine is 0.7.  Discharge Exam: Blood pressure 127/76, pulse 78, temperature 98 F (36.7 C), resp. rate 16, SpO2 97.00%.    General: Alert and oriented x3 no acute distress, feeling well.  Cardiovascular: Regular rate and rhythm without any rubs, no murmurs. Lungs: Clear to auscultation bilaterally normal respiratory effort Abdomen: Soft nontender normal bowel sounds Extremities: No clubbing, cyanosis, edema, normal pulses  Neurologic: Moves all extremities x4, nonfocal   Labs:   Lab Results  Component Value Date   WBC 6.1 07/02/2011   HGB 14.4 07/02/2011   HCT 43.8 07/02/2011   MCV 87.8 07/02/2011   PLT 197 07/02/2011    Lab 07/02/11 0540  NA 138  K 3.6  CL 104  CO2 26  BUN 20  CREATININE 0.74  CALCIUM 9.2  PROT --  BILITOT --  ALKPHOS --  ALT --  AST --  GLUCOSE 96   Lab Results  Component Value Date   CKTOTAL 371* 07/02/2011   CKMB 5.2* 07/02/2011   TROPONINI <0.30 07/02/2011    Lab Results  Component Value Date   CHOL 178 07/02/2011   CHOL 199 05/20/2009   Lab Results  Component Value Date   HDL 54 07/02/2011   HDL 58 05/20/2009   Lab Results  Component Value Date   LDLCALC 111* 07/02/2011   LDLCALC 136 05/20/2009   Lab Results  Component Value Date   TRIG 65 07/02/2011   Lab Results  Component Value Date   CHOLHDL 3.3 07/02/2011  Lab Results  Component Value Date   LDLDIRECT 117* 02/12/2010      Radiology: Chest x-ray is normal, personally viewed  BMW:UXLKGM sinus rhythm, no obvious ST segment changes.  FOLLOW UP PLANS AND APPOINTMENTS  Medication List  As of 07/02/2011 10:09 AM   ASK your doctor about these medications         aspirin 81 MG chewable tablet   Chew 81 mg by mouth daily.      aspirin 325 MG tablet   Take 325 mg by mouth every 4 (four) hours as needed. Pain       MAXALT 10 MG tablet   Generic drug: rizatriptan   TAKE 1 TABLET BY MOUTH AS NEEDED FOR ONSET OF HEADACHE MAXIMUM OF 2 TABLETS PER DAY           I will set him up for an outpatient stress test. I have given him my business card. If he is having any adverse symptoms, he knows to contact me or go to the [facility] emergency department immediately. I do believe it is important to complete this workup with a stress test given his family history, prior tobacco use.  BRING ALL MEDICATIONS WITH YOU TO FOLLOW UP APPOINTMENTS  Time spent with patient to include physician time: spent with patient education, speaking to the patient and his wife, reviewing medical records, labs, coordination with nursing   Signed: Donato Schultz 07/02/2011, 10:09 AM

## 2011-07-02 NOTE — H&P (Signed)
Harry Nash is an 56 y.o. male.   Chief Complaint: Chest pain  HPI: Patient is a 56 yr old man with a strong family history of premature CAD and history of tobacco use (quit 6 yrs ago) but otherwise no other risk factors for CAD He specifically denies HTN.DM and hyperlipidemia. He presents with a sudden episode of chest pain that occurred at about 9 pm last night while reading to his son. Pain was described as "tightening", like a band around his chest. It was associated with tingling sensation in his hands, shortness of breath and lightheadedness on rising from a sitting position.He denies palpitations, diaphoresis , nausea or vomiting. He has no orthopnea, PND, or leg edema. The pain lasted about 30 mins and by the time he got to the ER at about 10 pm it was gone. His EKG was normal except for a 0.102mm ST segment elevation in lead aVL (not meeting criteria), his CK and MB were elevated but troponin was normal.  History reviewed. No pertinent past medical history.  History reviewed. No pertinent past surgical history.  Family History  Problem Relation Age of Onset  . Heart disease Mother   . Heart disease Father    Social History:  reports that he has quit smoking. He does not have any smokeless tobacco history on file. He reports that he does not drink alcohol. His drug history not on file.  Allergies: No Known Allergies   (Not in a hospital admission)  Results for orders placed during the hospital encounter of 07/01/11 (from the past 48 hour(s))  CBC     Status: Normal   Collection Time   07/01/11 10:57 PM      Component Value Range Comment   WBC 6.8  4.0 - 10.5 (K/uL)    RBC 4.83  4.22 - 5.81 (MIL/uL)    Hemoglobin 14.2  13.0 - 17.0 (g/dL)    HCT 16.1  09.6 - 04.5 (%)    MCV 87.6  78.0 - 100.0 (fL)    MCH 29.4  26.0 - 34.0 (pg)    MCHC 33.6  30.0 - 36.0 (g/dL)    RDW 40.9  81.1 - 91.4 (%)    Platelets 200  150 - 400 (K/uL)   DIFFERENTIAL     Status: Abnormal   Collection  Time   07/01/11 10:57 PM      Component Value Range Comment   Neutrophils Relative 52  43 - 77 (%)    Neutro Abs 3.5  1.7 - 7.7 (K/uL)    Lymphocytes Relative 31  12 - 46 (%)    Lymphs Abs 2.1  0.7 - 4.0 (K/uL)    Monocytes Relative 14 (*) 3 - 12 (%)    Monocytes Absolute 0.9  0.1 - 1.0 (K/uL)    Eosinophils Relative 3  0 - 5 (%)    Eosinophils Absolute 0.2  0.0 - 0.7 (K/uL)    Basophils Relative 0  0 - 1 (%)    Basophils Absolute 0.0  0.0 - 0.1 (K/uL)   BASIC METABOLIC PANEL     Status: Abnormal   Collection Time   07/01/11 10:57 PM      Component Value Range Comment   Sodium 139  135 - 145 (mEq/L)    Potassium 4.1  3.5 - 5.1 (mEq/L)    Chloride 103  96 - 112 (mEq/L)    CO2 26  19 - 32 (mEq/L)    Glucose, Bld 99  70 -  99 (mg/dL)    BUN 24 (*) 6 - 23 (mg/dL)    Creatinine, Ser 1.61  0.50 - 1.35 (mg/dL)    Calcium 9.4  8.4 - 10.5 (mg/dL)    GFR calc non Af Amer >90  >90 (mL/min)    GFR calc Af Amer >90  >90 (mL/min)   CARDIAC PANEL(CRET KIN+CKTOT+MB+TROPI)     Status: Abnormal   Collection Time   07/01/11 10:58 PM      Component Value Range Comment   Total CK 450 (*) 7 - 232 (U/L)    CK, MB 6.3 (*) 0.3 - 4.0 (ng/mL)    Troponin I <0.30  <0.30 (ng/mL)    Relative Index 1.4  0.0 - 2.5     Dg Chest 2 View  07/01/2011  *RADIOLOGY REPORT*  Clinical Data: Chest pressure.  Shortness of breath.  CHEST - 2 VIEW  Comparison: None.  Findings: Normal sized heart.  Clear lungs with normal vascularity. Upper lumbar spine degenerative changes.  Minimal scoliosis.  IMPRESSION: No acute abnormality.  Original Report Authenticated By: Darrol Angel, M.D.    Review of Systems  Constitutional: Negative.   HENT: Negative.   Eyes: Negative.   Respiratory: Positive for shortness of breath. Negative for cough, hemoptysis, sputum production and wheezing.   Cardiovascular: Positive for chest pain. Negative for palpitations, orthopnea, claudication, leg swelling and PND.  Gastrointestinal: Positive  for heartburn. Negative for nausea and vomiting.  Genitourinary: Negative.   Musculoskeletal: Negative.   Skin: Negative.   Neurological: Positive for dizziness.  Endo/Heme/Allergies: Negative.   Psychiatric/Behavioral: Negative.     Blood pressure 138/83, pulse 78, temperature 98 F (36.7 C), resp. rate 16, SpO2 97.00%. Physical Exam  Constitutional: He is oriented to person, place, and time. He appears well-developed and well-nourished. No distress.  HENT:  Head: Normocephalic and atraumatic.  Mouth/Throat: Oropharynx is clear and moist.  Eyes: Conjunctivae are normal. Pupils are equal, round, and reactive to light. Right eye exhibits no discharge. Left eye exhibits no discharge. No scleral icterus.  Neck: No JVD present.  Cardiovascular: Normal rate, regular rhythm, normal heart sounds and intact distal pulses.  Exam reveals no gallop and no friction rub.   No murmur heard. Respiratory: Effort normal and breath sounds normal. No respiratory distress. He has no wheezes. He has no rales. He exhibits no tenderness.  GI: Soft. There is no tenderness.  Musculoskeletal: Normal range of motion. He exhibits no edema and no tenderness.  Neurological: He is alert and oriented to person, place, and time.  Skin: Skin is warm and dry. No rash noted. He is not diaphoretic. No erythema.  Psychiatric: He has a normal mood and affect.     Assessment/Plan Chest pain, r/o ACS  Admit for observation ASA 81mg   Serial EKGs and enzymes 2D echo Stress test in the morning if troponin remains negative   Lani Havlik 07/02/2011, 3:56 AM

## 2011-07-02 NOTE — Progress Notes (Signed)
*  PRELIMINARY RESULTS* Echocardiogram 2D Echocardiogram has been performed.  Al Corpus 07/02/2011, 10:10 AM

## 2011-11-11 ENCOUNTER — Telehealth: Payer: Self-pay | Admitting: Family Medicine

## 2011-11-11 DIAGNOSIS — F4323 Adjustment disorder with mixed anxiety and depressed mood: Secondary | ICD-10-CM | POA: Insufficient documentation

## 2011-11-11 DIAGNOSIS — F418 Other specified anxiety disorders: Secondary | ICD-10-CM

## 2011-11-11 MED ORDER — LORAZEPAM 0.5 MG PO TABS: 0.5000 mg | ORAL_TABLET | Freq: Three times a day (TID) | ORAL | Status: DC

## 2011-11-11 NOTE — Telephone Encounter (Signed)
Will consult with Dr. Leveda Anna and call back.

## 2011-11-11 NOTE — Telephone Encounter (Signed)
Is very concerned about her husband - his Gaul just folded a few weeks ago and now is in major depression.  Also very angry and lashes out - she is a Financial trader and she knows he needs to be on anti depressants but more important, she states he needs something now (today) for his anger - is not sure what to do and is asking to speak with Dr Leveda Anna

## 2011-11-11 NOTE — Assessment & Plan Note (Signed)
Benzo small supply until seen

## 2011-11-11 NOTE — Telephone Encounter (Signed)
Discussed and will call in benzo until seen.  See next week.  Likely start antidepressant then.

## 2011-11-14 ENCOUNTER — Ambulatory Visit (INDEPENDENT_AMBULATORY_CARE_PROVIDER_SITE_OTHER): Payer: BC Managed Care – PPO | Admitting: Family Medicine

## 2011-11-14 ENCOUNTER — Telehealth: Payer: Self-pay | Admitting: Family Medicine

## 2011-11-14 ENCOUNTER — Encounter: Payer: Self-pay | Admitting: Family Medicine

## 2011-11-14 VITALS — BP 135/86 | HR 71 | Ht 71.0 in | Wt 188.0 lb

## 2011-11-14 DIAGNOSIS — F341 Dysthymic disorder: Secondary | ICD-10-CM

## 2011-11-14 DIAGNOSIS — F418 Other specified anxiety disorders: Secondary | ICD-10-CM

## 2011-11-14 MED ORDER — PAROXETINE HCL 20 MG PO TABS: 20.0000 mg | ORAL_TABLET | ORAL | Status: DC

## 2011-11-14 MED ORDER — LORAZEPAM 0.5 MG PO TABS: 0.5000 mg | ORAL_TABLET | Freq: Three times a day (TID) | ORAL | Status: DC

## 2011-11-14 NOTE — Telephone Encounter (Signed)
Message forwarded to Dr. Leveda Anna.Loralee Pacas [place]

## 2011-11-14 NOTE — Telephone Encounter (Signed)
Patient will be coming in today to see Dr. Deirdre Priest this afternoon.  He does has an appt with his counselor before that as well.  She would appreciate if Dr. Leveda Anna can call Dr. Deirdre Priest to give him a heads up prior to the appt at 3:15 this afternoon, based on their call from Friday.

## 2011-11-14 NOTE — Progress Notes (Signed)
  Subjective:    Patient ID: Harry Nash, male    DOB: [birth year], 56 y.o.   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  Over last week very irritable but not physically violent.  Suicidal thoughts including mild plan of finding a rope and considering where rafters. PHQ9 - 23  Also feeling fatigued, diffuse muscle aches. Symptoms have improved with recent ativan which makes him less irritable and can rest better. Saw Dr [facility] psychologist early today.     PMH - took lexapro for a short time years ago. Caused sexual side effects and was hard to get off of. Uses Maxalt a few times a week at most   FH - several family members have responded well to paxil  SH - 1-2 beers per day.  No recreational substance use  Review of Systems     Objective:   Physical Exam Alert interactive occasionally cracks jokes but seems sad most of interview  No hallucinations or FOI PHQ9 - 23  Heart - Regular rate and rhythm.  No murmurs, gallops or rubs.    Lungs:  Normal respiratory effort, chest expands symmetrically. Lungs are clear to auscultation, no crackles or wheezes. Extremities:  No cyanosis, edema, or deformity noted with good range of motion of all major joints.        Assessment & Plan:

## 2011-11-14 NOTE — Telephone Encounter (Signed)
Done

## 2011-11-14 NOTE — Assessment & Plan Note (Signed)
New diagnosis rapidly worsening.  He will see Dr [facility] pschologist weekly.   Will start Paxil and continue ativan for short time.  His wife is a Best boy who concurs.  Discussed possible risk of suicide increasing with treatment and to watch closely.  He has a plan to call friend or daughter if needed.

## 2011-11-14 NOTE — Patient Instructions (Addendum)
Start the Paxil 1/2 to 1 every AM  Continue the ativan as needed slowly taper  See Dr Leveda Anna in 2 weeks  Call with any questions

## 2012-01-29 ENCOUNTER — Other Ambulatory Visit: Payer: Self-pay | Admitting: Family Medicine

## 2012-02-02 ENCOUNTER — Other Ambulatory Visit: Payer: Self-pay | Admitting: *Deleted

## 2012-02-03 MED ORDER — RIZATRIPTAN BENZOATE 10 MG PO TABS: ORAL_TABLET | ORAL | Status: DC

## 2012-02-10 ENCOUNTER — Encounter: Payer: Self-pay | Admitting: Family Medicine

## 2012-02-10 ENCOUNTER — Ambulatory Visit (INDEPENDENT_AMBULATORY_CARE_PROVIDER_SITE_OTHER): Payer: BC Managed Care – PPO | Admitting: Family Medicine

## 2012-02-10 VITALS — BP 123/75 | HR 68 | Temp 98.1°F | Ht 71.0 in | Wt 186.0 lb

## 2012-02-10 DIAGNOSIS — F418 Other specified anxiety disorders: Secondary | ICD-10-CM

## 2012-02-10 DIAGNOSIS — E7800 Pure hypercholesterolemia, unspecified: Secondary | ICD-10-CM

## 2012-02-10 DIAGNOSIS — G43909 Migraine, unspecified, not intractable, without status migrainosus: Secondary | ICD-10-CM

## 2012-02-10 DIAGNOSIS — Z0000 Encounter for general adult medical examination without abnormal findings: Secondary | ICD-10-CM | POA: Insufficient documentation

## 2012-02-10 DIAGNOSIS — Z23 Encounter for immunization: Secondary | ICD-10-CM

## 2012-02-10 DIAGNOSIS — F341 Dysthymic disorder: Secondary | ICD-10-CM

## 2012-02-10 MED ORDER — LORAZEPAM 0.5 MG PO TABS: 0.5000 mg | ORAL_TABLET | Freq: Three times a day (TID) | ORAL | Status: DC

## 2012-02-10 MED ORDER — RIZATRIPTAN BENZOATE 10 MG PO TABS: ORAL_TABLET | ORAL | Status: DC

## 2012-02-10 NOTE — Patient Instructions (Signed)
Things seem to be going well.   I am glad you are working with the therapist. Harry Nash get a flu shot and tetanus shot today. Consider signing up for My Chart I did refill your Maxalt and lorazepam. If you want to know more about the prostate cancer screening controversy, google PSA

## 2012-02-10 NOTE — Assessment & Plan Note (Signed)
Well controled by work with therapist, Jonny Ruiz Poag, and a very occaisional lorazepam.

## 2012-02-10 NOTE — Assessment & Plan Note (Signed)
Healthy and up to date.  Needs flu and tetanus today.  Labs done at hospitalization in June so no repeat at this time

## 2012-02-10 NOTE — Assessment & Plan Note (Signed)
Stable.  Discussed prophylaxis and he will stick with prn tryptan.

## 2012-02-10 NOTE — Progress Notes (Signed)
  Subjective:    Patient ID: Harry Nash, male    DOB: [birth year], 57 y.o.   MRN: [id number]  HPI  Here for med refill and check up.  Needs flu and tetatnus shot.  Did have admit last June for chest pain that turned out to be a combination of musculoskeletal and [employer].  No known cardiac disease.  LDL=11.  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  Seeing therapiist with whom he connects well.  Off paxil and very rarely takes a lorazepam.    Headaches are stable averaging 5-6 per month.      Review of Systems Denies CP, SOB, wt change, change in bowel or bladder.     Objective:   Physical ExamHEENT normal Neck supple without masses  Lungs clear Cardiac RRR without m or g. Abd benign Ext nl Neuro normal.       Assessment & Plan:

## 2012-02-10 NOTE — Assessment & Plan Note (Signed)
Had lipid panel done with 07/2011 admit for non cardiac chest pain.

## 2012-03-13 ENCOUNTER — Ambulatory Visit (INDEPENDENT_AMBULATORY_CARE_PROVIDER_SITE_OTHER): Payer: BC Managed Care – PPO | Admitting: Family Medicine

## 2012-03-13 ENCOUNTER — Encounter: Payer: Self-pay | Admitting: Family Medicine

## 2012-03-13 VITALS — BP 126/74 | HR 90 | Ht 71.0 in | Wt 189.0 lb

## 2012-03-13 DIAGNOSIS — S0100XA Unspecified open wound of scalp, initial encounter: Secondary | ICD-10-CM

## 2012-03-13 DIAGNOSIS — S0101XA Laceration without foreign body of scalp, initial encounter: Secondary | ICD-10-CM | POA: Insufficient documentation

## 2012-03-13 MED ORDER — BACITRACIN-POLYMYXIN B 500-10000 UNIT/GM EX OINT: TOPICAL_OINTMENT | Freq: Two times a day (BID) | CUTANEOUS | Status: DC

## 2012-03-13 NOTE — Assessment & Plan Note (Addendum)
A: Minor laceration, hemostatic with good approximation. Not deep/extensive enough to warrant suturing. No red flags in history to warrant imaging or further work-up/observation.  P: Rx for antibiotic ointment BID with dressing changes. Advised normal showering/bathing and to wash the area gently with soap and water. Reviewed signs/symptoms of local/systemic infections. Otherwise follow-up PRN.

## 2012-03-13 NOTE — Progress Notes (Signed)
  Subjective:    Patient ID: Harry Nash, male    DOB: September 11, 1955, 57 y.o.   MRN: [id number]  HPI: Pt presents to clinic for SDA for "gash on his forehead." Pt reports he was doing laundry in the basement about 1030 (~5 hours ago). He stood up and hit head on a large pipe/pipe valve above the washer/dryer that he has "been meaning to get rid of or do something about." Pt states he struck the pipe very hard which resulted in a large cut on forehead, which "bled like hell" for a couple of minutes, but stopped with simple pressure. Pt cleaned the area with soap and water and placed a simple adhesive bandage. Pt denies LOC or confusion/disorientation after the injury. Pt does endorse some nausea and significant pain in the area and in his head in general. Pt also with tightness in neck muscles. Pt denies change in vision. Pt has had no vomiting. He reports he took "a couple aspirin" for pain.  Review of Systems: As above. Otherwise no recent illness. No fevers/chills, chest pain, SOB.      Objective:   Physical Exam BP 126/74  Pulse 90  Ht 5\' 11"  (1.803 m)  Wt 189 lb (85.73 kg)  BMI 26.37 kg/m2 Gen: well-appearing adult male in NAD, pleasant and cooperative HEENT: approx 1-1.5 inch sagitally-oriented laceration to right-upper forehead near hair line with very small extension into hair-covered scalp  Lesion with minimal dried blood surrounding laceration; edges of laceration clean and not ragged/torn, well-approximated with minimal clot already formed  No surrounding erythema/edema/induration, area minimally tender Cardio/Pulm: RRR, CTAB, no wheezing or increased work of breathing Neuro/Psych: no gross focal deficits, alert/oriented, appropriate in conversation     Assessment & Plan:

## 2012-03-13 NOTE — Patient Instructions (Addendum)
Thanks for coming in, today! It was nice to meet you. The cut on your scalp does not look like it needs stitches. I will prescribe you an antibiotic ointment to place on the cut twice a day when you change bandages. Otherwise, you can bathe/shower as normal. Wash the area gently with soap and water. You may continue to have neck pain and/or headaches for a few days, but these should get better over time. You can take aspirin or Motrin/Advil (ibuprofen) up to 600 mg at a time, as needed for pain. If you have ANY of the following symptoms, call the clinic or go to the emergency room:    Fever over 100.3 that does not go away with Tylenol or that keeps coming back.    Persistent nausea and/or vomiting.    Worsening redness/swelling/pain in the area around the cut.     Problems with memory, persistent dizziness, loss of consciousness, changes in your vision. Otherwise, you can come back to see [place] as you need for any concerns or problems. --Dr. Casper Harrison

## 2012-06-28 ENCOUNTER — Encounter: Payer: Self-pay | Admitting: Family Medicine

## 2012-06-28 ENCOUNTER — Ambulatory Visit (INDEPENDENT_AMBULATORY_CARE_PROVIDER_SITE_OTHER): Payer: BC Managed Care – PPO | Admitting: Family Medicine

## 2012-06-28 VITALS — BP 110/64 | Temp 98.1°F | Ht 71.0 in | Wt 182.4 lb

## 2012-06-28 DIAGNOSIS — J069 Acute upper respiratory infection, unspecified: Secondary | ICD-10-CM

## 2012-06-28 NOTE — Patient Instructions (Signed)
Dear Mr. Wilkie,   I believe that you have a viral upper respiratory illness. I am sorry that it is so bothersome. The best treatment is to treat the symptoms and let your body fight the infection. Please take an anti-histamine like benadryl, allegra, or zyrtec to help with the nasal drainage. Also, please use cough syrup to help you sleep. Stay well hydrated, and you should be feeling better by the weekend.   Come back for persistent fevers, chest pain or difficulty breathing, or sinus pain.   Take Care,   Dr. Clinton Sawyer

## 2012-06-28 NOTE — Assessment & Plan Note (Signed)
Likely viral etiology. No indication for imaging or antibiotics. Given recommendations for conservative management and indications for follow up.

## 2012-06-28 NOTE — Progress Notes (Signed)
  Subjective:    Patient ID: Harry Nash, male    DOB: Aug 09, 1955, 57 y.o.   MRN: [id number]  HPI  57 year old M presenting for evaluation of cough. Duration > 1 week. Worse at night. Feels "lethargic" and getting worse. Non productive cough. Feels like his son has similar symptoms. Has not had a fever. Tried mucinex & alka-seltzer cold - neither effective.   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

## 2012-07-02 ENCOUNTER — Encounter (HOSPITAL_COMMUNITY): Payer: Self-pay | Admitting: *Deleted

## 2012-07-02 ENCOUNTER — Encounter (HOSPITAL_COMMUNITY): Payer: Self-pay | Admitting: Emergency Medicine

## 2012-07-02 ENCOUNTER — Emergency Department (HOSPITAL_COMMUNITY)
Admission: EM | Admit: 2012-07-02 | Discharge: 2012-07-02 | Disposition: A | Discharge status: Other Acute Inpatient Hospital | Payer: BC Managed Care – PPO | Attending: Emergency Medicine | Admitting: Emergency Medicine

## 2012-07-02 ENCOUNTER — Inpatient Hospital Stay (HOSPITAL_COMMUNITY)
Admission: RE | Admit: 2012-07-02 | Discharge: 2012-07-06 | DRG: 430 | Disposition: A | Discharge status: Home / Self Care | Payer: BC Managed Care – PPO | Attending: Psychiatry | Admitting: Psychiatry

## 2012-07-02 DIAGNOSIS — T148XXA Other injury of unspecified body region, initial encounter: Secondary | ICD-10-CM

## 2012-07-02 DIAGNOSIS — F329 Major depressive disorder, single episode, unspecified: Secondary | ICD-10-CM | POA: Insufficient documentation

## 2012-07-02 DIAGNOSIS — R45851 Suicidal ideations: Secondary | ICD-10-CM

## 2012-07-02 DIAGNOSIS — D126 Benign neoplasm of colon, unspecified: Secondary | ICD-10-CM

## 2012-07-02 DIAGNOSIS — F418 Other specified anxiety disorders: Secondary | ICD-10-CM

## 2012-07-02 DIAGNOSIS — F3289 Other specified depressive episodes: Secondary | ICD-10-CM | POA: Insufficient documentation

## 2012-07-02 DIAGNOSIS — Z0000 Encounter for general adult medical examination without abnormal findings: Secondary | ICD-10-CM

## 2012-07-02 DIAGNOSIS — Z79899 Other long term (current) drug therapy: Secondary | ICD-10-CM

## 2012-07-02 DIAGNOSIS — F332 Major depressive disorder, recurrent severe without psychotic features: Principal | ICD-10-CM

## 2012-07-02 DIAGNOSIS — S0101XA Laceration without foreign body of scalp, initial encounter: Secondary | ICD-10-CM

## 2012-07-02 DIAGNOSIS — F1210 Cannabis abuse, uncomplicated: Secondary | ICD-10-CM

## 2012-07-02 DIAGNOSIS — Z8679 Personal history of other diseases of the circulatory system: Secondary | ICD-10-CM | POA: Insufficient documentation

## 2012-07-02 DIAGNOSIS — Z0289 Encounter for other administrative examinations: Secondary | ICD-10-CM | POA: Insufficient documentation

## 2012-07-02 DIAGNOSIS — E7800 Pure hypercholesterolemia, unspecified: Secondary | ICD-10-CM

## 2012-07-02 DIAGNOSIS — Z87891 Personal history of nicotine dependence: Secondary | ICD-10-CM | POA: Insufficient documentation

## 2012-07-02 DIAGNOSIS — F32A Depression, unspecified: Secondary | ICD-10-CM

## 2012-07-02 DIAGNOSIS — J069 Acute upper respiratory infection, unspecified: Secondary | ICD-10-CM

## 2012-07-02 DIAGNOSIS — G43909 Migraine, unspecified, not intractable, without status migrainosus: Secondary | ICD-10-CM

## 2012-07-02 HISTORY — DX: Depression, unspecified: F32.A

## 2012-07-02 HISTORY — DX: Headache: R51

## 2012-07-02 HISTORY — DX: Major depressive disorder, single episode, unspecified: F32.9

## 2012-07-02 LAB — SALICYLATE LEVEL: Salicylate Lvl: <2 mg/dL — ABNORMAL LOW (ref 2.8–20.0)

## 2012-07-02 LAB — COMPREHENSIVE METABOLIC PANEL
CO2: 26 mEq/L (ref 19–32)
Chloride: 100 mEq/L (ref 96–112)
GFR calc Af Amer: >90 mL/min (ref >90)
Potassium: 3.9 mEq/L (ref 3.5–5.1)
Sodium: 136 mEq/L (ref 135–145)

## 2012-07-02 LAB — CBC
MCV: 85.7 fL (ref 78.0–100.0)
Platelets: 254 10*3/uL (ref 150–400)
RBC: 5.12 MIL/uL (ref 4.22–5.81)
WBC: 9.2 10*3/uL (ref 4.0–10.5)

## 2012-07-02 LAB — RAPID URINE DRUG SCREEN, HOSP PERFORMED
Barbiturates: NOT DETECTED
Cocaine: NOT DETECTED
Opiates: NOT DETECTED

## 2012-07-02 LAB — ACETAMINOPHEN LEVEL: Acetaminophen (Tylenol), Serum: <15 ug/mL (ref 10–30)

## 2012-07-02 LAB — ETHANOL: Alcohol, Ethyl (B): <11 mg/dL (ref 0–11)

## 2012-07-02 MED ORDER — ZOLPIDEM TARTRATE 5 MG PO TABS: 5.0000 mg | ORAL_TABLET | Freq: Every evening | ORAL | Status: DC | PRN

## 2012-07-02 MED ORDER — ACETAMINOPHEN 325 MG PO TABS: 650.0000 mg | ORAL_TABLET | ORAL | Status: DC | PRN

## 2012-07-02 MED ORDER — TRAZODONE HCL 50 MG PO TABS
50.0000 mg | ORAL_TABLET | Freq: Every evening | ORAL | Status: DC | PRN
  Administered 2012-07-02 – 2012-07-05 (×4): 50 mg via ORAL
  Filled 2012-07-02: qty 1
  Filled 2012-07-02: qty 6
  Filled 2012-07-02 (×8): qty 1
  Filled 2012-07-02: qty 6
  Filled 2012-07-02 (×4): qty 1

## 2012-07-02 MED ORDER — LORAZEPAM 1 MG PO TABS
1.0000 mg | ORAL_TABLET | Freq: Three times a day (TID) | ORAL | Status: DC | PRN
  Administered 2012-07-02: 1 mg via ORAL
  Filled 2012-07-02: qty 1

## 2012-07-02 MED ORDER — MAGNESIUM HYDROXIDE 400 MG/5ML PO SUSP: 30.0000 mL | Freq: Every day | ORAL | Status: DC | PRN

## 2012-07-02 MED ORDER — SUMATRIPTAN SUCCINATE 50 MG PO TABS
50.0000 mg | ORAL_TABLET | Freq: Two times a day (BID) | ORAL | Status: DC | PRN
  Administered 2012-07-03: 50 mg via ORAL
  Filled 2012-07-02: qty 1

## 2012-07-02 MED ORDER — ONDANSETRON HCL 4 MG PO TABS: 4.0000 mg | ORAL_TABLET | Freq: Three times a day (TID) | ORAL | Status: DC | PRN

## 2012-07-02 MED ORDER — ALUM & MAG HYDROXIDE-SIMETH 200-200-20 MG/5ML PO SUSP: 30.0000 mL | ORAL | Status: DC | PRN

## 2012-07-02 MED ORDER — ACETAMINOPHEN 325 MG PO TABS
650.0000 mg | ORAL_TABLET | Freq: Four times a day (QID) | ORAL | Status: DC | PRN
Start: 2012-07-02 — End: 2012-07-06

## 2012-07-02 MED ORDER — IBUPROFEN 200 MG PO TABS: 600.0000 mg | ORAL_TABLET | Freq: Three times a day (TID) | ORAL | Status: DC | PRN
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
## 2012-07-02 NOTE — Tx Team (Signed)
Initial Interdisciplinary Treatment Plan  PATIENT STRENGTHS: (choose at least two) Ability for insight Average or above average intelligence Capable of independent living Communication skills Financial means General fund of knowledge Motivation for treatment/growth Physical Health Supportive family/friends  PATIENT STRESSORS: Marital or family conflict   PROBLEM LIST: Problem List/Patient Goals Date to be addressed Date deferred Reason deferred Estimated date of resolution  Depression 07/02/12     Suicide Risk 07/02/12                                                DISCHARGE CRITERIA:  Improved stabilization in mood, thinking, and/or behavior Motivation to continue treatment in a less acute level of care Need for constant or close observation no longer present Reduction of life-threatening or endangering symptoms to within safe limits Verbal commitment to aftercare and medication compliance  PRELIMINARY DISCHARGE PLAN: Return to previous living arrangement  PATIENT/FAMIILY INVOLVEMENT: This treatment plan has been presented to and reviewed with the patient, Harry Nash, and/or family member, .  The patient and family have been given the opportunity to ask questions and make suggestions.  Harry Nash 07/02/2012, 8:04 PM
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
## 2012-07-02 NOTE — ED Provider Notes (Signed)
History    This chart was scribed for a non-physician practitioner working with Flint Melter, MD by Jiles Prows, ED scribe. This patient was seen in room WTR4/WLPT4 and the patient's care was started at 3:30 PM.   CSN: [id number]  Arrival date & time 07/02/12  1519   Chief Complaint  Patient presents with  . Medical Clearance    The history is provided by the patient and medical records. No language interpreter was used.   HPI Comments: Harry Nash is a 57 y.o. male with a h/o depression who presents to the Emergency Department due to suicidal thoughts that began 2 days ago.  He voluntarily went to see his therapist, who suggested he come here for further evaluation.  He sees his therapist weekly due to an episode this past fall.  He states he does not take medication for this issue as he didn't respond well to it.  Pt denies headache, diaphoresis, fever, chills, nausea, vomiting, diarrhea, weakness, cough, SOB and any other pain.   PCP Dr. Leveda Anna.  Pt denies smoking.  Past Medical History  Diagnosis Date  . Depression   . Headache(784.0)     History reviewed. No pertinent past surgical history.  Family History  Problem Relation Age of Onset  . Heart disease Mother   . Heart disease Father     History  Substance Use Topics  . Smoking status: Former Smoker    Quit date: 02/01/2003  . Smokeless tobacco: Not on file  . Alcohol Use: 8.4 oz/week    14 Glasses of wine per week      Review of Systems  Constitutional: Negative for fever and chills.  Respiratory: Negative for cough and choking.   Gastrointestinal: Negative for nausea, vomiting and diarrhea.  Psychiatric/Behavioral: Positive for suicidal ideas. Negative for hallucinations.  All other systems reviewed and are negative.    Allergies  Review of patient's allergies indicates no known allergies.  Home Medications   Current Outpatient Rx  Name  Route  Sig  Dispense  Refill  . aspirin 325 MG tablet   Oral   Take 325 mg by mouth every 4 (four) hours as needed. Pain         . LORazepam (ATIVAN) 0.5 MG tablet   Oral   Take 1 tablet (0.5 mg total) by mouth every 8 (eight) hours.   30 tablet   5   . rizatriptan (MAXALT) 10 MG tablet      May repeat in 2 hours if needed with a max of two tabs in 24 hours   10 tablet   12     BP 134/85  Pulse 65  Temp(Src) 98.1 F (36.7 C) (Oral)  Resp 18  SpO2 96%  Physical Exam  Nursing note and vitals reviewed. Constitutional: He is oriented to person, place, and time. He appears well-developed and well-nourished. No distress.  HENT:  Head: Normocephalic and atraumatic.  Eyes: EOM are normal.  Neck: Neck supple. No tracheal deviation present.  Cardiovascular: Normal rate, regular rhythm and normal heart sounds.   Pulmonary/Chest: Effort normal and breath sounds normal. No respiratory distress. He has no wheezes. He has no rales.  Musculoskeletal: Normal range of motion.  Neurological: He is alert and oriented to person, place, and time.  Skin: Skin is warm and dry.  Psychiatric: He has a normal mood and affect. His behavior is normal.    ED Course  Procedures (including critical care time) DIAGNOSTIC STUDIES: Oxygen Saturation is  96% on RA, low by my interpretation.    COORDINATION OF CARE: 3:33 PM - Discussed ED treatment with pt at bedside including medical clearance and blood tests and pt agrees.     Labs Reviewed  ACETAMINOPHEN LEVEL  CBC  COMPREHENSIVE METABOLIC PANEL  ETHANOL  SALICYLATE LEVEL  URINE RAPID DRUG SCREEN (HOSP PERFORMED)   No results found.   1. Suicidal ideations   2. Depression       MDM  Pt seen and examined. Pleasant cooperative. Here with worsening depression and SI plan to hang himself. He is here voluntarily, sent by his therapist. Will get screening labs and ACT assessment.   Filed Vitals:   07/02/12 1544  BP: 134/85  Pulse: 65  Temp: 98.1 F (36.7 C)  Resp: 18       I  personally performed the services described in this documentation, which was scribed in my presence. The recorded information has been reviewed and is accurate.   Lottie Mussel, PA-C 07/03/12 [phone]
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
## 2012-07-02 NOTE — ED Notes (Signed)
Pt was sent over from [facility] and that he is SI that he has a plan to hang himself due to his marital issues. Alert x4, calm

## 2012-07-02 NOTE — Progress Notes (Signed)
Patient ID: Harry Nash, male   DOB: 09/27/1955, 56 y.o.   MRN: [id number]  Admission Note: Pt admitted with SI and plan to hang himself, although pt denies at this time. Pt with fears of his marriage breaking up and losing custody of his three year old son. Pt states his wife is a Phd prepared mental health professional and fears that she knows exactly what to say to ensure he will not see his son again. Pt states the relationship is a volatile one and there are frequent heated arguments. Pt with hx of a failed marriage over fifteen years ago and loss of relationship with child with ex. Pt fears he will soon go through the same thing and says that he cannot lose contact with another child. Pt endorses depression with a dull, flat affect but is very cooperative and pleasant on assessment. Pt verbally contracts for safety on unit.

## 2012-07-02 NOTE — [facility] Counselor (Signed)
Patient accepted to room 507-1 by Verne Spurr, NP to Dr. Geoffery Lyons. Patient is here voluntarily and will be transported to [facility] via [facility]. Patient's support paperwork is completed and faxed to [facility]. EDP-Dr. Ranae Palms made aware of patients disposition. Patients nurse-Eric also made aware. Patients call report is [phone].

## 2012-07-02 NOTE — Progress Notes (Signed)
Labs reviewed.

## 2012-07-03 DIAGNOSIS — F1910 Other psychoactive substance abuse, uncomplicated: Secondary | ICD-10-CM

## 2012-07-03 DIAGNOSIS — F332 Major depressive disorder, recurrent severe without psychotic features: Principal | ICD-10-CM
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
## 2012-07-03 NOTE — [facility] Group Notes (Signed)
[facility] LCSW Group Therapy      Feelings About Diagnosis 1:15 - 2:30 PM          07/03/2012 12:57 PM  Type of Therapy:  Group Therapy  Participation Level:  Minimal  Participation Quality:  Appropriate  Affect:  Depressed  Cognitive:  Appropriate  Insight:  Developing/Improving  Engagement in Therapy:  Developing/Improving  Modes of Intervention:  Discussion, Education, Exploration, Problem-Solving, Rapport Building, Support   Summary of Progress/Problems:   Patient stated he finally gets the reason behind groups. He stated it person is given the privilege to share with other.  He stated he is dealing with an extreme amount of sadness at this tim but knows  He will get through it.  Wynn Banker 07/03/2012, 12:57 PM

## 2012-07-03 NOTE — Progress Notes (Signed)
D: Patient denies SI/HI and A/V hallucinations and states' I really did not mean to say and Im embarassed"; patient reports sleep is fair; reports appetite is good; reports energy level is normal ; reports ability to pay attention is good; patient declined to rate his depression, hopelessness, and anxiety  A: Monitored q 15 minutes; patient encouraged to attend groups; patient educated about medications; patient given medications per physician orders; patient encouraged to express feelings and/or concerns  R: Patient is sad and depressed and becomes tearful at times when talking about his present circumstances; patient's interaction with staff and peers is appropriate but very minimal; patient is attending all the groups and trying to engage and reports " trying to be open minded even though I don't need to be here"

## 2012-07-03 NOTE — Clinical Social Work Note (Signed)
Writer spoke with patients wife who shared patient is very angry and hostile towards her.  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  She shared they almost filed for bankruptcy.  Wife shard patient had a mental breakdown in October and threatened to harm himself (hang) at that time.  Wife shared she will be meeting with her therapist tomorrow to talk about where to go in the marriage from here.  She shared she has realized for a while that there marriage can not continue in its current stage.  She shared she does not plan to put patient out on the street but uncertain where they will go from here.  Wife was advise patient of any changes she intends to make in the marriage during his hospitalization to provide him time to process any change.

## 2012-07-03 NOTE — ED Provider Notes (Signed)
Medical screening examination/treatment/procedure(s) were performed by non-physician practitioner and as supervising physician I was immediately available for consultation/collaboration.  Krisna Omar L Tiann Saha, MD 07/03/12 1531 

## 2012-07-03 NOTE — [facility] Group Notes (Signed)
[facility] LCSW Aftercare Discharge Planning Group Note   07/03/2012 12:08 PM  Participation Quality:  Patient attended group but did not answer questions.  Writer to meet with patient privately.  Jeremiah Curci, Joesph July

## 2012-07-03 NOTE — Progress Notes (Signed)
Pt reports he has had an "interesting" day.  He said it was interesting because he had experienced some things today that he had never experienced before.  He would not elaborate any further.  He denies SI/HI/AV.  He says he has been going to groups.  He reported that his wife had visited.  He says he is more hopeful today, but still anxious.  Pt was encouraged to make his needs known to staff.  Support and encouragement offered.  Safety maintained with q15 minute checks.s
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
## 2012-07-03 NOTE — Progress Notes (Signed)
Recreation Therapy Notes  Date: 06.03.2014 Time: 2:45pm Location: [address] Dayroom      Group Topic/Focus: Musician (AAA/T)  Participation Level: Active  Participation Quality: Appropriate  Affect: Euthymic  Cognitive: Appropriate  Additional Comments: 06.03.2014 Session =  AAA  ; Dog Team = [facility] & handler  Patient pet and visited with Ponder. Patient asked appropriate questions about [facility] and his training. Patient interacted appropriately with peer, LRT and dog team.   [name], LRT/CTRS  Gweneth Dimitri L 07/03/2012 4:26 PM

## 2012-07-03 NOTE — Progress Notes (Signed)
Adult Psychoeducational Group Note  Date:  07/03/2012 Time:  1:31 PM  Group Topic/Focus:  Recovery Goals:   The focus of this group is to identify appropriate goals for recovery and establish a plan to achieve them.  Participation Level:  Did Not Attend  Additional Comments:  Pt did not attend group.  Shelly Bombard D 07/03/2012, 1:31 PM

## 2012-07-03 NOTE — Progress Notes (Signed)
Adult Psychoeducational Group Note  Date:  07/03/2012 Time:  9:36 PM  Group Topic/Focus:  Wrap-Up Group:   The focus of this group is to help patients review their daily goal of treatment and discuss progress on daily workbooks.  Participation Level:  Minimal  Participation Quality:  Appropriate and Attentive  Affect:  Flat  Cognitive:  Alert and Appropriate  Insight: Good  Engagement in Group:  Engaged  Modes of Intervention:  Support  Additional Comments:  Pt stated that he today was his first day here but he was much calmer than he was this morning, his "anxiety is down" and he's "accepted that he has to be here."   Harry Nash 07/03/2012, 9:36 PM
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
## 2012-07-03 NOTE — [facility] Suicide Risk Assessment (Signed)
[facility] INPATIENT:  Family/Significant Other Suicide Prevention Education  Suicide Prevention Education:  Education Completed; Rexene Agent, Wife, [phone]; has been identified by the patient as the family member/significant other with whom the patient will be residing, and identified as the person(s) who will aid the patient in the event of a mental health crisis (suicidal ideations/suicide attempt).  With written consent from the patient, the family member/significant other has been provided the following suicide prevention education, prior to the and/or following the discharge of the patient.  The suicide prevention education provided includes the following:  Suicide risk factors  Suicide prevention and interventions  National Suicide Hotline telephone number  [facility] assessment telephone number  [facility] Emergency Assistance 911  [facility] and/or Residential Mobile Crisis Unit telephone number  Request made of family/significant other to:  Remove weapons (e.g., guns, rifles, knives), all items previously/currently identified as safety concern.  Wife advised patient does not have access to guns.  Remove drugs/medications (over-the-counter, prescriptions, illicit drugs), all items previously/currently identified as a safety concern.  The family member/significant other verbalizes understanding of the suicide prevention education information provided.  The family member/significant other agrees to remove the items of safety concern listed above.  Wynn Banker 07/03/2012, 3:01 PM

## 2012-07-04 DIAGNOSIS — F4323 Adjustment disorder with mixed anxiety and depressed mood: Secondary | ICD-10-CM

## 2012-07-04 MED ORDER — DIPHENHYDRAMINE HCL 50 MG PO CAPS
50.0000 mg | ORAL_CAPSULE | Freq: Every evening | ORAL | Status: DC | PRN
  Filled 2012-07-04: qty 3

## 2012-07-04 MED ORDER — DULOXETINE HCL 30 MG PO CPEP
30.0000 mg | ORAL_CAPSULE | Freq: Every day | ORAL | Status: DC
  Administered 2012-07-04 – 2012-07-06 (×3): 30 mg via ORAL
  Filled 2012-07-04 (×4): qty 1
  Filled 2012-07-04: qty 7
  Filled 2012-07-04: qty 1

## 2012-07-04 NOTE — Progress Notes (Signed)
Pt reports he is doing well this evening.  He states he has spoken to his wife and feels they can work things out.  He also says the Trazodone has not help him to sleep, but that it makes him feel drugged.  Assured pt this would be reported to the PA.  Pt voiced no other needs or concerns.  Pt denies SI/HI/AV.  Pt encouraged to make his needs known to staff.  Support and encouragement offered.  Safety maintained with q15 minute checks.

## 2012-07-04 NOTE — Progress Notes (Signed)
Recreation Therapy Notes  Date: 06.04.2014  Time: 3:00pm  Location: [address]   Group Topic/Focus: Geophysicist/field seismologist   Participation Level:  Active   Participation Quality:  Appropriate and Attentive   Affect:  Euthymic   Cognitive:  Appropriate   Additional Comments: Activity: Adapted Toilet Paper Game; Explanation: Patients were instructed to take as many squares of toilet paper as they will need for the remainder of the afternoon. Patients were then instructed they were to identify that number of ways they personally develop or grow. Due to concerned looks on patient faces, as well as lack of motivation of patients to participate in activity LRT adapted activity for group to come up with a list of 18 ways they can personally develop.   Patient participated in group activity. Patient offered suggestions and activities to group list. Patient with peers successfully identified 18 ways they can personally develop post d/c. Patient contributed to wrap up discussion about benefits of growing and developing as a person.   Harry Nash, LRT/CTRS  [name] 07/04/2012 4:34 PM

## 2012-07-04 NOTE — [facility] Group Notes (Signed)
[facility] LCSW Group Therapy      Emotional Regulation1:15 - 2:30 PM          07/04/2012 3:17 PM  Type of Therapy:  Group Therapy  Participation Level:  Minimal  Participation Quality:  Appropriate  Affect:  Depressed  Cognitive:  Appropriate  Insight:  Developing/Improving  Engagement in Therapy:  Developing/Improving  Modes of Intervention:  Discussion, Education, Exploration, Problem-Solving, Rapport Building, Support   Summary of Progress/Problems:   Patient shared the emotion he needs to be more in control of is anger.  He shared he tends to say sharp and hurtful things to his wife.  Patient stated he blames her for things that happen in his life when she really has no part in the problem.  He was able to stated that sometimes she reminds him of his ex-wife.   Wynn Banker 07/04/2012, 3:17 PM

## 2012-07-04 NOTE — Progress Notes (Signed)
Adult Psychoeducational Group Note  Date:  07/04/2012 Time:  8:00PM Group Topic/Focus:  Wrap-Up Group:   The focus of this group is to help patients review their daily goal of treatment and discuss progress on daily workbooks.  Participation Level:  Active  Participation Quality:  Appropriate and Attentive  Affect:  Appropriate  Cognitive:  Alert and Appropriate  Insight: Appropriate  Engagement in Group:  Engaged  Modes of Intervention:  Discussion  Additional Comments:  Pt. Was attentive and appropriate during tonight's group discussion. Pt was able to share that he was able to wife about a plan and plan on taking it day by day once discharged. Pt. Stated that he had a good day and had learned a lot since being here.   Bing Plume D 07/04/2012, 9:37 PM

## 2012-07-04 NOTE — Progress Notes (Signed)
D: Patient denies SI/HI and A/V hallucinations; patient reports sleep is poor ; reports appetite is good; reports energy level is normal ; reports ability to pay attention is good; rates depression as 4/10; rates hopelessness 0/10; rates anxiety as 4/10; reports " my wife and I talked about some things that I believe will make things better"  A: Monitored q 15 minutes; patient encouraged to attend groups; patient educated about medications; patient given medications per physician orders; patient encouraged to express feelings and/or concerns  R: Patient is guarded but forwards a little more information than he did the day before; patient's interaction with staff and peers is appropriate; ; patient is taking medications as prescribed ; patient is attending all groups and engaging

## 2012-07-04 NOTE — Progress Notes (Signed)
[facility] MD Progress Note  07/04/2012 1:36 PM Harry Nash  MRN:  [id number] Subjective:  Patient has stated that he is experiencing revelation since his wife visited him and gave him left documentation about his attitude, negative things he said to her and realized tremendous amount of bitterness and anger that he expressed towards his wife. He stated that he does not have similar behaviors or emotions in public areas. "I cant manage my irritability and anger feelings which is costing my marriage and relationship". He has stated that he gave a thought regarding antidepressant medication and willing to give a trial and also gave verbal consent to communicate with his wife who is willing to visit the treatment team.   Diagnosis:  Axis I: Adjustment Disorder with Mixed Emotional Features, Major Depression, Recurrent severe and Substance Abuse  ADL's:  Intact  Sleep: Fair  Appetite:  Fair  Suicidal Ideation:  Patient endorses having thoughts of hanging himself in his therapist office for the last two days and during hospital evaluation. He blames his multiple stress for his suicidal thoughts. Homicidal Ideation:  Plan:  No Intent:  NO Means:  NO AEB (as evidenced by):  Psychiatric Specialty Exam: ROS  Blood pressure 110/75, pulse 118, temperature 98.4 F (36.9 C), temperature source Oral, resp. rate 16, height 5\' 11"  (1.803 m), weight 178 lb (80.74 kg).Body mass index is 24.84 kg/(m^2).  General Appearance: Fairly Groomed, Guarded and Armed forces training and education officer::  Fair  Speech:  Clear and Coherent  Volume:  Normal  Mood:  Angry, Anxious, Depressed, Hopeless, Irritable and Worthless  Affect:  Depressed  Thought Process:  Coherent and Goal Directed  Orientation:  Full (Time, Place, and Person)  Thought Content:  Paranoid Ideation and Rumination  Suicidal Thoughts:  Yes.  with intent/plan  Homicidal Thoughts:  No  Memory:  Immediate;   Fair Recent;   Fair  Judgement:  Intact  Insight:  Lacking   Psychomotor Activity:  Restlessness  Concentration:  Fair  Recall:  Fair  Akathisia:  NA  Handed:  Right  AIMS (if indicated):     Assets:  Communication Skills Desire for Improvement Physical Health Social Support Talents/Skills Transportation  Sleep:  Number of Hours: 6   Current Medications: Current Facility-Administered Medications  Medication Dose Route Frequency Provider Last Rate Last Dose  . acetaminophen (TYLENOL) tablet 650 mg  650 mg Oral Q6H PRN Verne Spurr, PA-C      . alum & mag hydroxide-simeth (MAALOX/MYLANTA) 200-200-20 MG/5ML suspension 30 mL  30 mL Oral Q4H PRN Verne Spurr, PA-C      . DULoxetine (CYMBALTA) DR capsule 30 mg  30 mg Oral Daily Nehemiah Settle, MD      . magnesium hydroxide (MILK OF MAGNESIA) suspension 30 mL  30 mL Oral Daily PRN Verne Spurr, PA-C      . SUMAtriptan (IMITREX) tablet 50 mg  50 mg Oral BID PRN Verne Spurr, PA-C   50 mg at 07/03/12 1654  . traZODone (DESYREL) tablet 50 mg  50 mg Oral QHS,MR X 1 Kerry Hough, PA-C   50 mg at 07/03/12 2216    Lab Results:  Results for orders placed during the hospital encounter of 07/02/12 (from the past 48 hour(s))  ACETAMINOPHEN LEVEL     Status: None   Collection Time    07/02/12  3:40 PM      Result Value Range   Acetaminophen (Tylenol), Serum <15.0  10 - 30 ug/mL   Comment:  THERAPEUTIC CONCENTRATIONS VARY     SIGNIFICANTLY. A RANGE OF 10-30     ug/mL MAY BE AN EFFECTIVE     CONCENTRATION FOR MANY PATIENTS.     HOWEVER, SOME ARE BEST TREATED     AT CONCENTRATIONS OUTSIDE THIS     RANGE.     ACETAMINOPHEN CONCENTRATIONS     >150 ug/mL AT 4 HOURS AFTER     INGESTION AND >50 ug/mL AT 12     HOURS AFTER INGESTION ARE     OFTEN ASSOCIATED WITH TOXIC     REACTIONS.  CBC     Status: None   Collection Time    07/02/12  3:40 PM      Result Value Range   WBC 9.2  4.0 - 10.5 K/uL   RBC 5.12  4.22 - 5.81 MIL/uL   Hemoglobin 15.2  13.0 - 17.0 g/dL   HCT 78.2   95.6 - 21.3 %   MCV 85.7  78.0 - 100.0 fL   MCH 29.7  26.0 - 34.0 pg   MCHC 34.6  30.0 - 36.0 g/dL   RDW 08.6  57.8 - 46.9 %   Platelets 254  150 - 400 K/uL  COMPREHENSIVE METABOLIC PANEL     Status: None   Collection Time    07/02/12  3:40 PM      Result Value Range   Sodium 136  135 - 145 mEq/L   Potassium 3.9  3.5 - 5.1 mEq/L   Chloride 100  96 - 112 mEq/L   CO2 26  19 - 32 mEq/L   Glucose, Bld 94  70 - 99 mg/dL   BUN 15  6 - 23 mg/dL   Creatinine, Ser 6.29  0.50 - 1.35 mg/dL   Calcium 9.5  8.4 - 52.8 mg/dL   Total Protein 7.3  6.0 - 8.3 g/dL   Albumin 3.9  3.5 - 5.2 g/dL   AST 22  0 - 37 U/L   ALT 25  0 - 53 U/L   Alkaline Phosphatase 74  39 - 117 U/L   Total Bilirubin 0.4  0.3 - 1.2 mg/dL   GFR calc non Af Amer >90  >90 mL/min   GFR calc Af Amer >90  >90 mL/min   Comment:            The eGFR has been calculated     using the CKD EPI equation.     This calculation has not been     validated in all clinical     situations.     eGFR's persistently     <90 mL/min signify     possible Chronic Kidney Disease.  ETHANOL     Status: None   Collection Time    07/02/12  3:40 PM      Result Value Range   Alcohol, Ethyl (B) <11  0 - 11 mg/dL   Comment:            LOWEST DETECTABLE LIMIT FOR     SERUM ALCOHOL IS 11 mg/dL     FOR MEDICAL PURPOSES ONLY  SALICYLATE LEVEL     Status: Abnormal   Collection Time    07/02/12  3:40 PM      Result Value Range   Salicylate Lvl <2.0 (*) 2.8 - 20.0 mg/dL  URINE RAPID DRUG SCREEN (HOSP PERFORMED)     Status: Abnormal   Collection Time    07/02/12  3:50 PM      Result  Value Range   Opiates NONE DETECTED  NONE DETECTED   Cocaine NONE DETECTED  NONE DETECTED   Benzodiazepines NONE DETECTED  NONE DETECTED   Amphetamines NONE DETECTED  NONE DETECTED   Tetrahydrocannabinol POSITIVE (*) NONE DETECTED   Barbiturates NONE DETECTED  NONE DETECTED   Comment:            DRUG SCREEN FOR MEDICAL PURPOSES     ONLY.  IF CONFIRMATION IS NEEDED      FOR ANY PURPOSE, NOTIFY LAB     WITHIN 5 DAYS.                LOWEST DETECTABLE LIMITS     FOR URINE DRUG SCREEN     Drug Class       Cutoff (ng/mL)     Amphetamine      1000     Barbiturate      200     Benzodiazepine   200     Tricyclics       300     Opiates          300     Cocaine          300     THC              50    Physical Findings: AIMS: Facial and Oral Movements Muscles of Facial Expression: None, normal Lips and Perioral Area: None, normal Jaw: None, normal Tongue: None, normal,Extremity Movements Upper (arms, wrists, hands, fingers): None, normal Lower (legs, knees, ankles, toes): None, normal, Trunk Movements Neck, shoulders, hips: None, normal, Overall Severity Severity of abnormal movements (highest score from questions above): None, normal Incapacitation due to abnormal movements: None, normal Patient's awareness of abnormal movements (rate only patient's report): No Awareness, Dental Status Current problems with teeth and/or dentures?: No Does patient usually wear dentures?: No  CIWA:    COWS:     Treatment Plan Summary: Daily contact with patient to assess and evaluate symptoms and progress in treatment Medication management  Plan:  1. Case discussed with treatment team 2. Case manager will be in contact with his spouse with consent 3. Will arrange a family meeting with treatment team 4. Discussed with patient risks and benefit of antidepressant medication 5. Obtain verbal consent for Cymbalta, start 3omg daily and continue trazodone PRN for sleep 6. Reviewed medical records and lab values 7. Encouraged active participation in individual, group and family therapies 8. Monitor for adverse effects  Medical Decision Making Problem Points:  Established problem, worsening (2), New problem, with no additional work-up planned (3) and Review of psycho-social stressors (1) Data Points:  Independent review of image, tracing, or specimen (2) Review or  order clinical lab tests (1) Review of medication regiment & side effects (2)  I certify that inpatient services furnished can reasonably be expected to improve the patient's condition.   Sherria Riemann,JANARDHAHA R. 07/04/2012, 1:36 PM

## 2012-07-04 NOTE — Tx Team (Signed)
Interdisciplinary Treatment Plan Update   Date Reviewed:  07/04/2012  Time Reviewed:  9:23 AM  Progress in Treatment:   Attending groups: Yes Participating in groups: Yes Taking medication as prescribed: No, patient is refusing to be on medications. Tolerating medication: N/A Family/Significant other contact made: Yes, contact made with wife  Patient understands diagnosis: Yes  Discussing patient identified problems/goals with staff: Yes Medical problems stabilized or resolved: Yes Denies suicidal/homicidal ideation: Yes Patient has not harmed self or others: Yes  For review of initial/current patient goals, please see plan of care.  Estimated Length of Stay:  2-3 days  Reasons for Continued Hospitalization:  Anxiety Depression Medication stabilization  New Problems/Goals identified:    Discharge Plan or Barriers:   Home with outpatient follow up Dr. Jolyne Loa  Additional Comments:  Patient was admitted from [facility] for depression and suicidal ideation with plan of hanging himself. Initially he was presented to his therapist with the suicidal thoughts, referred to [facility] and than he was sent to [facility] due to not having beds. Patient reports that he has been in quarrel with his wife and feels he is going to loose her and thinking about ending his life. 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   Patient is refusing medications at time.   Attendees:  Patient:  07/04/2012 9:23 AM   Signature: Mervyn Gay, MD 07/04/2012 9:23 AM  Signature: Leighton Parody, RN 07/04/2012 9:23 AM  Signature: Chinita Greenland, RN 07/04/2012 9:23 AM  Signature: 07/04/2012 9:23 AM  Signature:   07/04/2012 9:23 AM  Signature:  Juline Patch, LCSW 07/04/2012 9:23 AM  Signature:  Reyes Ivan 07/04/2012 9:23 AM  Signature:  Maseta Dorley,Care Coordinator 07/04/2012 9:23 AM  Signature: Fransisca Kaufmann, [facility] 07/04/2012 9:23 AM  Signature:    Signature:    Signature:       Scribe for Treatment Team:   Juline Patch,  07/04/2012 9:23 AM

## 2012-07-04 NOTE — [facility] Group Notes (Signed)
[facility] LCSW Aftercare Discharge Planning Group Note   07/04/2012 9:25 AM  Participation Quality:  Appropriate  Mood/Affect:  Appropriate and Depressed  Depression Rating:  4  Anxiety Rating:  3  Thoughts of Suicide:  No  Will you contract for safety?   NA  Current AVH:  No  Plan for Discharge/Comments:  Patient reports doing better today.  He shared he had a talk with his wife and the outcome of the conversation was hopeful.  Transportation Means: Patient has transportation.  Supports:  Patient has a good support system.   Keiry Kowal, Joesph July

## 2012-07-05 MED ORDER — LORAZEPAM 1 MG PO TABS
1.0000 mg | ORAL_TABLET | Freq: Once | ORAL | Status: AC
  Administered 2012-07-05: 1 mg via ORAL
  Filled 2012-07-05: qty 1

## 2012-07-05 MED ORDER — LORAZEPAM 1 MG PO TABS
1.0000 mg | ORAL_TABLET | Freq: Once | ORAL | Status: AC
  Administered 2012-07-05: 1 mg via ORAL

## 2012-07-05 MED ORDER — LORAZEPAM 1 MG PO TABS
ORAL_TABLET | ORAL | Status: AC
  Filled 2012-07-05: qty 1

## 2012-07-05 NOTE — Clinical Social Work Note (Signed)
Writer spoke with patient's wife who was upset that patient had advised her that a group was focused on swing man and show the picture of someone swinging from a noose.  She shared patient was very upset as his suicide plan was to hang himself in the garage.  She also shared patient had been upset that another patient was assessed for HI during group.  Writer sent note to administration advising of wife's concerns.

## 2012-07-05 NOTE — [facility] Group Notes (Signed)
Adult Psychoeducational Group Note  Date:  07/05/2012 Time:  10:00am  Group Topic/Focus: Therapeutic Activity - Swing Man Self Esteem Action Plan:   The focus of this group is to help patients create a plan to continue to build self-esteem after discharge.  Participation Level:  Did Not Attend  Participation Quality:  Did not attend  Affect:  None  Cognitive:  None  Insight: None  Engagement in Group:  None  Modes of Intervention:  Activity  Additional Comments:  Casimiro Needle did not attend group.  Caroll Rancher A 07/05/2012, 11:33 AM

## 2012-07-05 NOTE — Progress Notes (Signed)
D:  Per pt self inventory pt reports sleeping poorly, appetite poor, energy level high, ability to pay attention good, pt did not rate his depression and hopelessness on self inventory sheet, pt came out of group and angrily said to the MD "Do you know what they are doing in there?  Do you know what they are doing in there?  They are playing hangman!", group activity was swing man not hangman bu it upset the pt because it was similar to how hangman is played, pt's BP 192/109 p-130, pt seems very anxious, denies SI/HI/AVH, contracts for safety.  A:  Emotional support provided, encouraged pt to go to try deep breathing and to go to his room and lay down and try and relax/calm himself down, MD notified, new order given for one time dose of PO Ativan, 1mg  PO Ativan given to pt, q15 min checks maintained for safety.  R:  Pt crying but does not want to talk at this time, wants to be left alone, pt confirms that he is safe and does not want to hurt himself.

## 2012-07-05 NOTE — Progress Notes (Signed)
Adult Psychoeducational Group Note  Date:  07/05/2012 Time:  9:28 PM  Group Topic/Focus:  Wrap-Up Group:   The focus of this group is to help patients review their daily goal of treatment and discuss progress on daily workbooks.  Participation Level:  Active  Participation Quality:  Appropriate, Attentive and Supportive  Affect:  Flat  Cognitive:  Alert, Appropriate and Oriented  Insight: Appropriate  Engagement in Group:  Engaged  Modes of Intervention:  Discussion and Support  Additional Comments:  Pt was very vocal, stating that "the patients are currently treating themselves". He also said that he had laughed a lot more today and that his goal for tomorrow is simply to make it through the day.   Humberto Seals Monique 07/05/2012, 9:28 PM

## 2012-07-05 NOTE — [facility] Group Notes (Signed)
[facility] LCSW Group Therapy  [facility] 1:15 -2:30 PM               07/05/2012 3:13 PM  Type of Therapy:  Group Therapy  Participation Level:  Minimal  Participation Quality:  Appropriate  Affect:  Depressed  Cognitive:  Appropriate  Insight:  Developing/Improving  Engagement in Therapy:  Developing/Improving  Modes of Intervention:  Discussion, Education, Exploration, Problem-Solving, Rapport Building, Support   Summary of Progress/Problems:   Patient listened attentively to speaker from [employer] [facility].  He asked questions following the presentation about who has professional oversight over their programming due to them working with patient's who have severe mental health issues.   Speaker advised due to them advising from the onset that they are not a professional group with no licensed MDs or therapist, they are not supervised but there are staff members who are certified Peer counselors.   Wynn Banker 07/05/2012, 3:13 PM

## 2012-07-05 NOTE — Progress Notes (Signed)
[facility] MD Progress Note  07/05/2012 11:21 AM Harry Nash  MRN:  [id number]  Subjective:  Patient has been quiet upset about the group activity and stated that he does not feel he has staff with good qualification treating him. He has aparantly emotional, anxious and upset with fine tremors. He stated "do you know what is going in the group?", they are talking about swing man or hangman game which made me upset and he would not continue the meeting. He is willing to talk when becomes calm. He was given ativan one mg as one time dose. He has increased blood pressue is 192 /109 and PR is 130 , when he is visibly upset. It may be due to being upset or being on cymbalta.    Diagnosis:  Axis I: Adjustment Disorder with Mixed Emotional Features, Major Depression, Recurrent severe and Substance Abuse  ADL's:  Intact  Sleep: Fair  Appetite:  Fair  Suicidal Ideation:  Patient endorses having thoughts of hanging himself in his therapist office for the last two days and during hospital evaluation. He blames his multiple stress for his suicidal thoughts. Homicidal Ideation:  Plan:  No Intent:  NO Means:  NO AEB (as evidenced by):  Psychiatric Specialty Exam: ROS  Blood pressure 192/109, pulse 130, temperature 97.7 F (36.5 C), temperature source Oral, resp. rate 18, height 5\' 11"  (1.803 m), weight 178 lb (80.74 kg).Body mass index is 24.84 kg/(m^2).  General Appearance: Fairly Groomed, Guarded and Armed forces training and education officer::  Fair  Speech:  Clear and Coherent  Volume:  Normal  Mood:  Angry, Anxious, Depressed, Hopeless, Irritable and Worthless  Affect:  Depressed  Thought Process:  Coherent and Goal Directed  Orientation:  Full (Time, Place, and Person)  Thought Content:  Paranoid Ideation and Rumination  Suicidal Thoughts:  Yes.  with intent/plan  Homicidal Thoughts:  No  Memory:  Immediate;   Fair Recent;   Fair  Judgement:  Intact  Insight:  Lacking  Psychomotor Activity:  Restlessness   Concentration:  Fair  Recall:  Fair  Akathisia:  NA  Handed:  Right  AIMS (if indicated):     Assets:  Communication Skills Desire for Improvement Physical Health Social Support Talents/Skills Transportation  Sleep:  Number of Hours: 6   Current Medications: Current Facility-Administered Medications  Medication Dose Route Frequency Provider Last Rate Last Dose  . acetaminophen (TYLENOL) tablet 650 mg  650 mg Oral Q6H PRN Verne Spurr, PA-C      . alum & mag hydroxide-simeth (MAALOX/MYLANTA) 200-200-20 MG/5ML suspension 30 mL  30 mL Oral Q4H PRN Verne Spurr, PA-C      . diphenhydrAMINE (BENADRYL) capsule 50 mg  50 mg Oral QHS PRN Kerry Hough, PA-C      . DULoxetine (CYMBALTA) DR capsule 30 mg  30 mg Oral Daily Nehemiah Settle, MD   30 mg at 07/05/12 0813  . magnesium hydroxide (MILK OF MAGNESIA) suspension 30 mL  30 mL Oral Daily PRN Verne Spurr, PA-C      . SUMAtriptan (IMITREX) tablet 50 mg  50 mg Oral BID PRN Verne Spurr, PA-C   50 mg at 07/03/12 1654  . traZODone (DESYREL) tablet 50 mg  50 mg Oral QHS,MR X 1 Kerry Hough, PA-C   50 mg at 07/03/12 2216    Lab Results:  No results found for this or any previous visit (from the past 48 hour(s)).  Physical Findings: AIMS: Facial and Oral Movements Muscles of Facial Expression:  None, normal Lips and Perioral Area: None, normal Jaw: None, normal Tongue: None, normal,Extremity Movements Upper (arms, wrists, hands, fingers): None, normal Lower (legs, knees, ankles, toes): None, normal, Trunk Movements Neck, shoulders, hips: None, normal, Overall Severity Severity of abnormal movements (highest score from questions above): None, normal Incapacitation due to abnormal movements: None, normal Patient's awareness of abnormal movements (rate only patient's report): No Awareness, Dental Status Current problems with teeth and/or dentures?: No Does patient usually wear dentures?: No  CIWA:    COWS:      Treatment Plan Summary: Daily contact with patient to assess and evaluate symptoms and progress in treatment Medication management  Plan:  1. Case discussed with treatment team 2. Case manager will be in contact with his spouse with consent 3. Case manger will be arranging family meeting  4. Discussed with patient risks and benefit of antidepressant medication 5. Obtain verbal consent for Cymbalta 30 mg daily, gave  Ativan 1 mg for now for uncontrollable anxiety and continue trazodone PRN for sleep 6. Reviewed medical records and lab values 7. Encouraged active participation in individual, group and family therapies 8. Monitor for adverse effects  Medical Decision Making Problem Points:  Established problem, worsening (2), New problem, with no additional work-up planned (3) and Review of psycho-social stressors (1) Data Points:  Independent review of image, tracing, or specimen (2) Review or order clinical lab tests (1) Review of medication regiment & side effects (2)  I certify that inpatient services furnished can reasonably be expected to improve the patient's condition.   Teyla Skidgel,JANARDHAHA R. 07/05/2012, 11:21 AM

## 2012-07-06 MED ORDER — ASPIRIN 325 MG PO TABS: 325.0000 mg | ORAL_TABLET | ORAL | Status: DC | PRN

## 2012-07-06 MED ORDER — DIPHENHYDRAMINE HCL 50 MG PO CAPS: 50.0000 mg | ORAL_CAPSULE | Freq: Every evening | ORAL | Status: DC | PRN

## 2012-07-06 MED ORDER — DULOXETINE HCL 30 MG PO CPEP: 30.0000 mg | ORAL_CAPSULE | Freq: Every day | ORAL | Status: DC

## 2012-07-06 MED ORDER — TRAZODONE HCL 50 MG PO TABS: 50.0000 mg | ORAL_TABLET | Freq: Every evening | ORAL | Status: DC | PRN

## 2012-07-06 MED ORDER — RIZATRIPTAN BENZOATE 10 MG PO TABS: ORAL_TABLET | ORAL | Status: DC

## 2012-07-06 NOTE — Tx Team (Signed)
Interdisciplinary Treatment Plan Update   Date Reviewed:  07/06/2012  Time Reviewed:  10:16 AM  Progress in Treatment:   Attending groups: Yes Participating in groups: Yes Taking medication as prescribed: Yes  Tolerating medication: Yes Family/Significant other contact made: Yes, contact has been made with wife  Patient understands diagnosis: Yes  Discussing patient identified problems/goals with staff: Yes Medical problems stabilized or resolved: Yes Denies suicidal/homicidal ideation: Yes Patient has not harmed self or others: Yes  For review of initial/current patient goals, please see plan of care.  Estimated Length of Stay:  Discharge today  Reasons for Continued Hospitalization:   New Problems/Goals identified:    Discharge Plan or Barriers:   Home with outpatient follow up Dr. Jolyne Loa  Additional Comments:  Patient reports doing well.  He denies SI/HI and reports being ready to discharge home today.  He rates depression at two and anxiety at one.  Attendees:  Patient:  Harry Nash 07/06/2012 10:16 AM   Signature: Mervyn Gay, MD 07/06/2012 10:16 AM  Signature: 07/06/2012 10:16 AM  Signature: Elliot Cousin, RN 07/06/2012 10:16 AM  Signature: Waynetta Sandy, RN 07/06/2012 10:16 AM  Signature:   07/06/2012 10:16 AM  Signature:  Juline Patch, LCSW 07/06/2012 10:16 AM  Signature:  Reyes Ivan, LCSW 07/06/2012 10:16 AM  Signature:  Sharin Grave Coordinator 07/06/2012 10:16 AM  Signature: Fransisca Kaufmann, [facility] 07/06/2012 10:16 AM  Signature:    Signature:    Signature:      Scribe for Treatment Team:   Juline Patch,  07/06/2012 10:16 AM

## 2012-07-06 NOTE — Progress Notes (Signed)
D:  Per pt self inventory pt reports sleeping well, appetite good, energy level normal, ability to pay attention good, rates depression at 1 out of 10 and hopelessness at a 1 out of 10, denies SI/HI/AVH, pt "feels more hopeful today". Denies pain, rates anxiety at a 1 out of 10    A:  Emotional support provided, Encouraged pt to continue with treatment plan and attend all group activities, q15 min checks maintained for safety.  R:  Pt is feels ready to discharge, pt was brighter today than yesterday, interacts well with peers and staff, calm and cooperative, attends and participates in groups.

## 2012-07-06 NOTE — Plan of Care (Signed)
Problem: Alteration in mood Goal: LTG-Pt's behavior demonstrates decreased signs of depression Patient is rating depression at four. He is attending groups and discussing concerns.  Horace Porteous Jaise Moser, LCSW 07/04/2012 Outcome: Completed/Met Date Met:  07/06/12 Patient has attended groups and talked about problems related to depression.  His outpatient follow up is scheduled.  Horace Porteous Takiyah Bohnsack, LCSW 07/06/2012

## 2012-07-06 NOTE — [facility] Group Notes (Signed)
[facility] LCSW Aftercare Discharge Planning Group Note   07/06/2012 10:22 AM  Participation Quality:  Appropriate  Mood/Affect:  Appropriate and Depressed  Depression Rating: 2  Anxiety Rating: 1  Thoughts of Suicide:  No  Will you contract for safety?   NA  Current AVH:  No  Plan for Discharge/Comments:  Patient reports doing better today.  He shared he has a follow up appointment in place for Monday, July 09, 2012 with his therapist.  Transportation Means: Patient has transportation.  Supports:  Patient has a good support system.   Harry Nash, Joesph July

## 2012-07-06 NOTE — Progress Notes (Signed)
D: Patient denies SI/HI/AVH. Patient rates hopelessness as 1,  depression as 3, and anxiety as 1.  Patient affect is blunted and depressed.  Mood is depressed and anxious.  Pt states "I think that my medications are working and I laughed today.  We laughed in the dayroom today as a group.  Since I've been here I haven't seen anyone laugh.  That has really lifted my mood."  Patient did attend evening group. Patient visible on the milieu. No distress noted. A: Support and encouragement offered. Scheduled medications given to pt. Q 15 min checks continued for patient safety. R: Patient receptive. Patient remains safe on the unit.

## 2012-07-06 NOTE — [facility] Group Notes (Signed)
[facility] LCSW Group Therapy  Feelings Around Relapse 1:15 -2:30 PM               07/06/2012 3:00 PM  Type of Therapy:  Group Therapy  Participation Level:  Active  Participation Quality:  Appropriate  Affect: Appropriate  Cognitive:  Appropriate  Insight:  Engaged  Engagement in Therapy: Engaged Modes of Intervention:  Discussion, Education, Exploration, Problem-Solving, Rapport Building, Support   Summary of Progress/Problems:   Patient shared relapsed for him would being falling back into negative and harsh behaviors.  He shared he has to take time to separate reminders of past events into what is actually happening now.  Mance Vallejo Hairston 07/06/2012, 3:00 PM

## 2012-07-06 NOTE — Progress Notes (Signed)
[facility] Adult Case Management Discharge Plan :  Will you be returning to the same living situation after discharge: No.  Patient to discharge to a friend's home. At discharge, do you have transportation home?:Yes,  Patient's wife to transport at discharge. Do you have the ability to pay for your medications:Yes,  Patient is able to afford medications.  Release of information consent forms completed and in the chart;  Patient's signature needed at discharge.  Patient to Follow up at: Follow-up Information   Follow up with Theotis Barrio, PhD On 07/09/2012. (Monday, July 09, 2012 at 11 AM)    Contact information:   [address] [place], [place]   [address]  [phone]      Follow up with Dr. Jannifer Franklin - [facility] On 08/22/2012. (Wednesday, August 22, 2012 at 2:30 PM.  Dr. Jannifer Franklin office is trying to work you into an earlier appointemnt.  Writer will call you with updated appointment.)    Contact information:   [address] [place], [place]  [address]  [phone]      Patient denies SI/HI:   Patient no longer endorsing SI/HI or other thoughts of self harm.     Safety Planning and Suicide Prevention discussed: .Reviewed with all patients during discharge planning group  Kiante Ciavarella, Joesph July 07/06/2012, 2:40 PM

## 2012-07-06 NOTE — Progress Notes (Signed)
Date: 07/06/2012  Time: 11:00  Group Topic/Focus:  Relapse Prevention Planning: The focus of this group is to define relapse and discuss the need for planning to combat relapse.  Participation Level: Active  Participation Quality: Appropriate and Supportive  Affect: Appropriate  Cognitive: Appropriate  Insight: Appropriate  Engagement in Group: Engaged and Supportive  Modes of Intervention: Discussion, Education and Support  Additional Comments: pt is excited about going home. Harry Nash M  07/06/2012, 2:57 PM  
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
## 2012-07-06 NOTE — [facility] Suicide Risk Assessment (Signed)
Suicide Risk Assessment  Discharge Assessment     Demographic Factors:  Male, Adolescent or young adult, Caucasian and Unemployed  Mental Status Per Nursing Assessment::   On Admission:  NA  Current Mental Status by Physician: Self-harm thoughts  Loss Factors: Decrease in vocational status, Financial problems/change in socioeconomic status and Strained relationship  Historical Factors: NA  Risk Reduction Factors:   Sense of responsibility to family, Religious beliefs about death, Employed, Living with another person, especially a relative, Positive social support, Positive therapeutic relationship and Positive coping skills or problem solving skills  Continued Clinical Symptoms:  Severe Anxiety and/or Agitation Depression:   Anhedonia Hopelessness Impulsivity Insomnia Recent sense of peace/wellbeing Severe Unstable or Poor Therapeutic Relationship Previous Psychiatric Diagnoses and Treatments Medical Diagnoses and Treatments/Surgeries  Cognitive Features That Contribute To Risk:  Closed-mindedness Polarized thinking    Suicide Risk:  Mild:  Suicidal ideation of limited frequency, intensity, duration, and specificity.  There are no identifiable plans, no associated intent, mild dysphoria and related symptoms, good self-control (both objective and subjective assessment), few other risk factors, and identifiable protective factors, including available and accessible social support.  Discharge Diagnoses:   AXIS I:  Major Depression, Recurrent severe AXIS II:  Deferred AXIS III:   Past Medical History  Diagnosis Date  . Depression   . Headache(784.0)    AXIS IV:  economic problems, occupational problems, other psychosocial or environmental problems and problems related to social environment AXIS V:  51-60 moderate symptoms  Plan Of Care/Follow-up recommendations:  Activity:  As tolerated Diet:  Regular  Is patient on multiple antipsychotic therapies at discharge:   No   Has Patient had three or more failed trials of antipsychotic monotherapy by history:  No  Recommended Plan for Multiple Antipsychotic Therapies: Not applicable  Hasel Janish,JANARDHAHA R. 07/06/2012, 11:13 AM

## 2012-07-10 NOTE — Progress Notes (Signed)
Patient Discharge Instructions:  After Visit Summary (AVS):   Faxed to:  07/10/12 Discharge Summary Note:   Faxed to:  07/10/12 Psychiatric Admission Assessment Note:   Faxed to:  07/10/12 Suicide Risk Assessment - Discharge Assessment:   Faxed to:  07/10/12 Faxed/Sent to the Next Level Care provider:  07/10/12 Faxed to [facility] @ [phone] Faxed to [facility] @ [phone]  Jerelene Redden, 07/10/2012, 3:48 PM

## 2012-08-09 ENCOUNTER — Other Ambulatory Visit: Payer: Self-pay | Admitting: Family Medicine

## 2013-01-07 ENCOUNTER — Other Ambulatory Visit: Payer: Self-pay | Admitting: Family Medicine

## 2013-03-13 ENCOUNTER — Ambulatory Visit (INDEPENDENT_AMBULATORY_CARE_PROVIDER_SITE_OTHER): Payer: BC Managed Care – PPO | Admitting: Family Medicine

## 2013-03-13 ENCOUNTER — Encounter: Payer: Self-pay | Admitting: Family Medicine

## 2013-03-13 VITALS — BP 126/78 | HR 78 | Temp 98.1°F | Ht 71.0 in | Wt 186.0 lb

## 2013-03-13 DIAGNOSIS — J069 Acute upper respiratory infection, unspecified: Secondary | ICD-10-CM

## 2013-03-13 NOTE — Progress Notes (Signed)
Family Medicine Office Visit Note   Subjective:   Patient ID: Harry Nash, male  DOB: 1955-10-09, 58 y.o.. MRN: [id number]   Pt that comes today for f/u recently diagnosed cold. He has been taking Tamiflu prescribed since 2/10 and beside tx he reports feeling sick and concerned for pneumonia. He denies fever but has some chills and endorses nasal congestion.  Also mild to moderated non-productive cough worse at night. Denies wheezing, SOB, chest pain, nausea or vomiting.   Review of Systems:  Per HPI  Objective:   Physical Exam: Gen:  NAD HEENT: Moist mucous membranes  CV: Regular rate and rhythm, no murmurs rubs or gallops PULM: Clear to auscultation bilaterally. No wheezes/rales/rhonchi ABD: Soft, non tender, non distended, normal bowel sounds EXT: No edema Neuro: Alert and oriented x3. No focalization  Assessment & Plan:

## 2013-03-13 NOTE — Patient Instructions (Signed)
It has been a pleasure to see you today. Please continue taking the medications prescribed.  Your physical exam is negative for pneumonia at this time but if you don't improve or your current symptoms worsen you need to get seen again for imaging studies.

## 2013-03-15 DIAGNOSIS — J069 Acute upper respiratory infection, unspecified: Secondary | ICD-10-CM | POA: Insufficient documentation

## 2013-03-15 NOTE — Assessment & Plan Note (Signed)
Most likely viral, I doubt is flu but we don's have swab to completely r/o. Exam is reassuring. We recommend to finish Tamiflu and discussed sign that should prompt re-evaluation. Plus symptom base therapy  F/u as needed

## 2013-05-21 ENCOUNTER — Telehealth: Payer: Self-pay | Admitting: Family Medicine

## 2013-05-21 NOTE — Telephone Encounter (Signed)
Please advise.Thank you.Christabel Camire S Charlsie Fleeger  

## 2013-05-21 NOTE — Telephone Encounter (Signed)
Patient states he normally gets Maxalt with 9 refills but lately he's been getting only 6 refills. He is would like to know why is this change? Please advise.

## 2013-05-22 MED ORDER — RIZATRIPTAN BENZOATE 10 MG PO TABS: ORAL_TABLET | ORAL | Status: DC

## 2013-05-22 NOTE — Telephone Encounter (Signed)
Relayed message ,patient voice understanding.Travis

## 2013-05-22 NOTE — Telephone Encounter (Signed)
No reason.  Please let patient know I sent new Rx with 12 refills.  He should also see me at his convenience for an annual check up.

## 2013-08-18 ENCOUNTER — Emergency Department (HOSPITAL_COMMUNITY)
Admission: EM | Admit: 2013-08-18 | Discharge: 2013-08-18 | Disposition: A | Discharge status: Home / Self Care | Payer: BC Managed Care – PPO | Attending: Emergency Medicine | Admitting: Emergency Medicine

## 2013-08-18 ENCOUNTER — Emergency Department (HOSPITAL_COMMUNITY): Payer: BC Managed Care – PPO

## 2013-08-18 DIAGNOSIS — Z87891 Personal history of nicotine dependence: Secondary | ICD-10-CM | POA: Insufficient documentation

## 2013-08-18 DIAGNOSIS — Z7982 Long term (current) use of aspirin: Secondary | ICD-10-CM | POA: Insufficient documentation

## 2013-08-18 DIAGNOSIS — Z87442 Personal history of urinary calculi: Secondary | ICD-10-CM | POA: Insufficient documentation

## 2013-08-18 DIAGNOSIS — R109 Unspecified abdominal pain: Secondary | ICD-10-CM | POA: Diagnosis present

## 2013-08-18 DIAGNOSIS — Z8659 Personal history of other mental and behavioral disorders: Secondary | ICD-10-CM | POA: Insufficient documentation

## 2013-08-18 DIAGNOSIS — N23 Unspecified renal colic: Secondary | ICD-10-CM | POA: Diagnosis not present

## 2013-08-18 LAB — URINALYSIS, ROUTINE W REFLEX MICROSCOPIC
BILIRUBIN URINE: NEGATIVE
GLUCOSE, UA: NEGATIVE mg/dL
KETONES UR: NEGATIVE mg/dL
Leukocytes, UA: NEGATIVE
Nitrite: NEGATIVE
PROTEIN: NEGATIVE mg/dL
Specific Gravity, Urine: 1.029 (ref 1.005–1.030)
Urobilinogen, UA: 0.2 mg/dL (ref 0.0–1.0)
pH: 5 (ref 5.0–8.0)

## 2013-08-18 LAB — CBC
HCT: 41.7 % (ref 39.0–52.0)
Hemoglobin: 14.1 g/dL (ref 13.0–17.0)
MCH: 28.7 pg (ref 26.0–34.0)
MCHC: 33.8 g/dL (ref 30.0–36.0)
MCV: 84.8 fL (ref 78.0–100.0)
PLATELETS: 216 10*3/uL (ref 150–400)
RBC: 4.92 MIL/uL (ref 4.22–5.81)
RDW: 12.9 % (ref 11.5–15.5)
WBC: 7 10*3/uL (ref 4.0–10.5)

## 2013-08-18 LAB — I-STAT TROPONIN, ED: TROPONIN I, POC: 0 ng/mL (ref 0.00–0.08)

## 2013-08-18 LAB — I-STAT CHEM 8, ED
BUN: 17 mg/dL (ref 6–23)
CALCIUM ION: 1.19 mmol/L (ref 1.12–1.23)
CREATININE: 1 mg/dL (ref 0.50–1.35)
Chloride: 102 mEq/L (ref 96–112)
Glucose, Bld: 86 mg/dL (ref 70–99)
HCT: 44 % (ref 39.0–52.0)
Hemoglobin: 15 g/dL (ref 13.0–17.0)
Potassium: 3.6 mEq/L — ABNORMAL LOW (ref 3.7–5.3)
Sodium: 139 mEq/L (ref 137–147)
TCO2: 24 mmol/L (ref 0–100)

## 2013-08-18 LAB — URINE MICROSCOPIC-ADD ON

## 2013-08-18 MED ORDER — ONDANSETRON 4 MG PO TBDP: ORAL_TABLET | ORAL | Status: DC

## 2013-08-18 MED ORDER — KETOROLAC TROMETHAMINE 30 MG/ML IJ SOLN
30.0000 mg | Freq: Once | INTRAMUSCULAR | Status: AC
  Administered 2013-08-18: 30 mg via INTRAVENOUS
  Filled 2013-08-18: qty 1

## 2013-08-18 MED ORDER — MORPHINE SULFATE 4 MG/ML IJ SOLN
4.0000 mg | Freq: Once | INTRAMUSCULAR | Status: AC
  Administered 2013-08-18: 4 mg via INTRAVENOUS
  Filled 2013-08-18: qty 1

## 2013-08-18 MED ORDER — ONDANSETRON HCL 4 MG/2ML IJ SOLN
4.0000 mg | Freq: Once | INTRAMUSCULAR | Status: AC
  Administered 2013-08-18: 4 mg via INTRAVENOUS
  Filled 2013-08-18: qty 2

## 2013-08-18 MED ORDER — TAMSULOSIN HCL 0.4 MG PO CAPS
0.4000 mg | ORAL_CAPSULE | Freq: Two times a day (BID) | ORAL | Status: DC
Start: 2013-08-18 — End: 2014-06-11

## 2013-08-18 MED ORDER — HYDROCODONE-ACETAMINOPHEN 5-325 MG PO TABS: 1.0000 | ORAL_TABLET | ORAL | Status: DC | PRN

## 2013-08-18 MED ORDER — HYDROMORPHONE HCL PF 1 MG/ML IJ SOLN
1.0000 mg | Freq: Once | INTRAMUSCULAR | Status: AC
  Administered 2013-08-18: 1 mg via INTRAVENOUS
  Filled 2013-08-18: qty 1

## 2013-08-18 MED ORDER — TAMSULOSIN HCL 0.4 MG PO CAPS
0.8000 mg | ORAL_CAPSULE | Freq: Once | ORAL | Status: AC
  Administered 2013-08-18: 0.8 mg via ORAL
  Filled 2013-08-18: qty 2

## 2013-08-18 NOTE — ED Notes (Signed)
US at bedside

## 2013-08-18 NOTE — ED Notes (Signed)
Pt given a urine strainer by RN as part of discharge instructions.

## 2013-08-18 NOTE — ED Notes (Signed)
Pt states that he has a hx of kidney stones and feels like he is having one now. R sided flank pain. Has not had a kidney stone in approx. 5+ years. Alert and oriented. Ambulatory.

## 2013-08-18 NOTE — ED Provider Notes (Signed)
Medical screening examination/treatment/procedure(s) were performed by non-physician practitioner and as supervising physician I was immediately available for consultation/collaboration.   EKG Interpretation None        Orpah Greek, MD 08/18/13 [phone]

## 2013-08-18 NOTE — ED Provider Notes (Signed)
CSN: [id number]     Arrival date & time 08/18/13  2037 History   First MD Initiated Contact with Patient 08/18/13 2045     Chief Complaint  Patient presents with  . Flank Pain     (Consider location/radiation/quality/duration/timing/severity/associated sxs/prior Treatment) Patient is a 58 y.o. male presenting with flank pain. The history is provided by the patient and medical records. No language interpreter was used.  Flank Pain Pertinent negatives include no abdominal pain, chest pain, coughing, diaphoresis, fatigue, fever, headaches, nausea, rash or vomiting.    Harry Nash is a 58 y.o. male  with a hx of nephrolithiasis, depression, headache presents to the Emergency Department complaining of acute, persistent right flank pain onset 10 minutes prior to arrival. Patient reports his pain today is exactly like all previous episodes of nephrolithiasis. He reports it starts in the right flank and radiates into the right groin. He denies hematuria or dysuria. Patient denies fever, chills, nausea, vomiting, penile discharge, testicular pain. Patient denies associated symptoms or aggravating or alleviating factors.  Past Medical History  Diagnosis Date  . Depression   . Headache(784.0)    No past surgical history on file. Family History  Problem Relation Age of Onset  . Heart disease Mother   . Heart disease Father    History  Substance Use Topics  . Smoking status: Former Smoker    Quit date: 02/01/2003  . Smokeless tobacco: Not on file  . Alcohol Use: 8.4 oz/week    14 Glasses of wine per week    Review of Systems  Constitutional: Negative for fever, diaphoresis, appetite change, fatigue and unexpected weight change.  HENT: Negative for mouth sores.   Eyes: Negative for visual disturbance.  Respiratory: Negative for cough, chest tightness, shortness of breath and wheezing.   Cardiovascular: Negative for chest pain.  Gastrointestinal: Negative for nausea, vomiting,  abdominal pain, diarrhea and constipation.  Endocrine: Negative for polydipsia, polyphagia and polyuria.  Genitourinary: Positive for flank pain. Negative for dysuria, urgency, frequency and hematuria.  Musculoskeletal: Negative for back pain and neck stiffness.  Skin: Negative for rash.  Allergic/Immunologic: Negative for immunocompromised state.  Neurological: Negative for syncope, light-headedness and headaches.  Hematological: Does not bruise/bleed easily.  Psychiatric/Behavioral: Negative for sleep disturbance. The patient is not nervous/anxious.       Allergies  Review of patient's allergies indicates no known allergies.  Home Medications   Prior to Admission medications   Medication Sig Start Date End Date Taking? Authorizing Provider  aspirin EC 81 MG tablet Take 81 mg by mouth daily.   Yes Historical Provider, MD  ibuprofen (ADVIL,MOTRIN) 200 MG tablet Take 400 mg by mouth every 6 (six) hours as needed (pain).   Yes Historical Provider, MD  rizatriptan (MAXALT) 10 MG tablet Take 10 mg by mouth as needed for migraine (migraine). May repeat in 2 hours if needed   Yes Historical Provider, MD  HYDROcodone-acetaminophen (NORCO/VICODIN) 5-325 MG per tablet Take 1-2 tablets by mouth every 4 (four) hours as needed for moderate pain or severe pain. 08/18/13   Deryl Giroux, PA-C  ondansetron (ZOFRAN ODT) 4 MG disintegrating tablet 4mg  ODT q4 hours prn nausea/vomit 08/18/13   Dawnn Nam, PA-C  tamsulosin (FLOMAX) 0.4 MG CAPS capsule Take 1 capsule (0.4 mg total) by mouth 2 (two) times daily. 08/18/13   Kathlee Barnhardt, PA-C   BP 153/95  Pulse 67  Temp(Src) 98 F (36.7 C) (Oral)  SpO2 99% Physical Exam  Nursing note and vitals reviewed. Constitutional:  He appears well-developed and well-nourished. No distress.  Awake, alert, nontoxic appearance  HENT:  Head: Normocephalic and atraumatic.  Mouth/Throat: Oropharynx is clear and moist. No oropharyngeal exudate.   Eyes: Conjunctivae are normal. No scleral icterus.  Neck: Normal range of motion. Neck supple.  Cardiovascular: Normal rate, regular rhythm, normal heart sounds and intact distal pulses.   Pulmonary/Chest: Effort normal and breath sounds normal. No respiratory distress. He has no wheezes.  Abdominal: Soft. Bowel sounds are normal. He exhibits no distension and no mass. There is no tenderness. There is no rebound, no guarding and no CVA tenderness. Hernia confirmed negative in the right inguinal area and confirmed negative in the left inguinal area.  Abdomen soft and nontender No CVA tenderness  Genitourinary: Testes normal. Cremasteric reflex is present. Right testis shows no mass, no swelling and no tenderness. Right testis is descended. Cremasteric reflex is not absent on the right side. Left testis shows no mass, no swelling and no tenderness. Left testis is descended. Cremasteric reflex is not absent on the left side. Circumcised. No phimosis, paraphimosis, hypospadias, penile erythema or penile tenderness. No discharge found.  No hernias No testicular tenderness  Musculoskeletal: Normal range of motion. He exhibits no edema.  Lymphadenopathy:       Right: No inguinal adenopathy present.       Left: No inguinal adenopathy present.  Neurological: He is alert.  Speech is clear and goal oriented Moves extremities without ataxia  Skin: Skin is warm and dry. No rash noted. He is not diaphoretic.  Psychiatric: He has a normal mood and affect.    ED Course  Procedures (including critical care time) Labs Review Labs Reviewed  URINALYSIS, ROUTINE W REFLEX MICROSCOPIC - Abnormal; Notable for the following:    Color, Urine AMBER (*)    APPearance CLOUDY (*)    Hgb urine dipstick LARGE (*)    All other components within normal limits  I-STAT CHEM 8, ED - Abnormal; Notable for the following:    Potassium 3.6 (*)    All other components within normal limits  CBC  URINE MICROSCOPIC-ADD ON   Harry Nash, ED    Imaging Review US Renal  08/18/2013   CLINICAL DATA:  Flank pain.  History of kidney stones.  EXAM: RENAL/URINARY TRACT ULTRASOUND COMPLETE  COMPARISON:  Noncontrast CT 02/06/2010.  FINDINGS: Right Kidney:  Length: 11.3 cm. There is a cyst in the upper pole which measures 1.9 cm maximally. No hydronephrosis or calculus identified.  Left Kidney:  Length: 10.8 cm. Echogenicity within normal limits. No mass or hydronephrosis visualized.  Bladder:  Appears normal for degree of bladder distention. Left ureteral jet documented. Right ureteral jet not visualized.  IMPRESSION: 1. Stable cyst in the upper pole of the right kidney. 2. No hydronephrosis or acute findings demonstrated. Right ureteral jet was not documented.   Electronically Signed   By: Camie Patience M.D.   On: 08/18/2013 22:19     EKG Interpretation None      MDM   Final diagnoses:  Renal colic on right side    Harry Nash presents with right flank pain and longstanding hx kidney stones.  Pt reports his last stones was approx 4 years ago.  Will obtain basic labs, urinalysis and renal ultrasound.  11:07 PM Patient reports his pain is improved but has not resolved. He is tolerating by mouth without difficulty.  I certainly and CBC reassuring. UA with large amount of hemoglobin. Renal ultrasound no evidence of hydronephrosis.  Patient's symptoms are consistent with renal colic.  Serum creatine WNL, vitals sign stable and the pt does not have irratractable vomiting. Pt will be dc home with pain medications & has been advised to follow up with PCP and urology.  I have personally reviewed patient's vitals, nursing note and any pertinent labs or imaging.  I performed an undressed physical exam.    At this time, it has been determined that no acute conditions requiring further emergency intervention. The patient/guardian have been advised of the diagnosis and plan. I reviewed all labs and imaging including any  potential incidental findings. We have discussed signs and symptoms that warrant return to the ED, such as intractable vomiting, high fevers, worsening symptoms.  Patient/guardian has voiced understanding and agreed to follow-up with the PCP or specialist in one week.  Vital signs are stable at discharge.   BP 153/95  Pulse 67  Temp(Src) 98 F (36.7 C) (Oral)  SpO2 99%        Abigail Butts, PA-C 08/18/13 2309

## 2013-08-18 NOTE — Discharge Instructions (Signed)
1. Medications: vicodin, flomax, zofran, usual home medications 2. Treatment: rest, drink plenty of fluids,  3. Follow Up: Please followup with your primary doctor and urology for discussion of your diagnoses and further evaluation after today's visit; if you do not have a primary care doctor use the resource guide provided to find one;      Kidney Stones Kidney stones (urolithiasis) are deposits that form inside your kidneys. The intense pain is caused by the stone moving through the urinary tract. When the stone moves, the ureter goes into spasm around the stone. The stone is usually passed in the urine.  CAUSES   A disorder that makes certain neck glands produce too much parathyroid hormone (primary hyperparathyroidism).  A buildup of uric acid crystals, similar to gout in your joints.  Narrowing (stricture) of the ureter.  A kidney obstruction present at birth (congenital obstruction).  Previous surgery on the kidney or ureters.  Numerous kidney infections. SYMPTOMS   Feeling sick to your stomach (nauseous).  Throwing up (vomiting).  Blood in the urine (hematuria).  Pain that usually spreads (radiates) to the groin.  Frequency or urgency of urination. DIAGNOSIS   Taking a history and physical exam.  Blood or urine tests.  CT scan.  Occasionally, an examination of the inside of the urinary bladder (cystoscopy) is performed. TREATMENT   Observation.  Increasing your fluid intake.  Extracorporeal shock wave lithotripsy--This is a noninvasive procedure that uses shock waves to break up kidney stones.  Surgery may be needed if you have severe pain or persistent obstruction. There are various surgical procedures. Most of the procedures are performed with the use of small instruments. Only small incisions are needed to accommodate these instruments, so recovery time is minimized. The size, location, and chemical composition are all important variables that will  determine the proper choice of action for you. Talk to your health care provider to better understand your situation so that you will minimize the risk of injury to yourself and your kidney.  HOME CARE INSTRUCTIONS   Drink enough water and fluids to keep your urine clear or pale yellow. This will help you to pass the stone or stone fragments.  Strain all urine through the provided strainer. Keep all particulate matter and stones for your health care provider to see. The stone causing the pain may be as small as a grain of salt. It is very important to use the strainer each and every time you pass your urine. The collection of your stone will allow your health care provider to analyze it and verify that a stone has actually passed. The stone analysis will often identify what you can do to reduce the incidence of recurrences.  Only take over-the-counter or prescription medicines for pain, discomfort, or fever as directed by your health care provider.  Make a follow-up appointment with your health care provider as directed.  Get follow-up X-rays if required. The absence of pain does not always mean that the stone has passed. It may have only stopped moving. If the urine remains completely obstructed, it can cause loss of kidney function or even complete destruction of the kidney. It is your responsibility to make sure X-rays and follow-ups are completed. Ultrasounds of the kidney can show blockages and the status of the kidney. Ultrasounds are not associated with any radiation and can be performed easily in a matter of minutes. SEEK MEDICAL CARE IF:  You experience pain that is progressive and unresponsive to any pain medicine you  have been prescribed. SEEK IMMEDIATE MEDICAL CARE IF:   Pain cannot be controlled with the prescribed medicine.  You have a fever or shaking chills.  The severity or intensity of pain increases over 18 hours and is not relieved by pain medicine.  You develop a new onset  of abdominal pain.  You feel faint or pass out.  You are unable to urinate. MAKE SURE YOU:   Understand these instructions.  Will watch your condition.  Will get help right away if you are not doing well or get worse. Document Released: 01/17/2005 Document Revised: 09/19/2012 Document Reviewed: 06/20/2012 [facility] Patient Information 2015 [place], [place]. This information is not intended to replace advice given to you by your health care provider. Make sure you discuss any questions you have with your health care provider.

## 2014-02-03 ENCOUNTER — Other Ambulatory Visit: Payer: Self-pay | Admitting: Family Medicine

## 2014-03-19 ENCOUNTER — Telehealth: Payer: Self-pay | Admitting: Family Medicine

## 2014-03-19 MED ORDER — RIZATRIPTAN BENZOATE 10 MG PO TABS: ORAL_TABLET | ORAL | Status: DC

## 2014-03-19 NOTE — Telephone Encounter (Signed)
Spoke with patient and informed him of below 

## 2014-03-19 NOTE — Telephone Encounter (Signed)
On his last refill on maxaalp, he was given only 4 tablets. Rx shows 10. Per pharmacy insurance says they will only pay for 4 pills Please advise appt was scheduled

## 2014-03-19 NOTE — Telephone Encounter (Signed)
Sent in another rx for 4 tabs.  Patient does need an appointment.

## 2014-03-28 ENCOUNTER — Ambulatory Visit: Payer: Self-pay | Admitting: Family Medicine

## 2014-05-20 ENCOUNTER — Other Ambulatory Visit: Payer: Self-pay | Admitting: Family Medicine

## 2014-05-23 ENCOUNTER — Ambulatory Visit: Payer: Self-pay | Admitting: Family Medicine

## 2014-05-30 ENCOUNTER — Ambulatory Visit: Payer: Self-pay | Admitting: Family Medicine

## 2014-06-11 ENCOUNTER — Ambulatory Visit (INDEPENDENT_AMBULATORY_CARE_PROVIDER_SITE_OTHER): Payer: BLUE CROSS/BLUE SHIELD | Admitting: Family Medicine

## 2014-06-11 ENCOUNTER — Encounter: Payer: Self-pay | Admitting: Family Medicine

## 2014-06-11 VITALS — BP 119/93 | HR 69 | Temp 98.2°F | Ht 71.0 in | Wt 187.8 lb

## 2014-06-11 DIAGNOSIS — Z0000 Encounter for general adult medical examination without abnormal findings: Secondary | ICD-10-CM | POA: Diagnosis not present

## 2014-06-11 DIAGNOSIS — E7800 Pure hypercholesterolemia, unspecified: Secondary | ICD-10-CM

## 2014-06-11 DIAGNOSIS — Z0001 Encounter for general adult medical examination with abnormal findings: Secondary | ICD-10-CM | POA: Insufficient documentation

## 2014-06-11 DIAGNOSIS — Z114 Encounter for screening for human immunodeficiency virus [HIV]: Secondary | ICD-10-CM | POA: Diagnosis not present

## 2014-06-11 DIAGNOSIS — F418 Other specified anxiety disorders: Secondary | ICD-10-CM

## 2014-06-11 DIAGNOSIS — G43009 Migraine without aura, not intractable, without status migrainosus: Secondary | ICD-10-CM

## 2014-06-11 LAB — LIPID PANEL
CHOLESTEROL: 193 mg/dL (ref 0–200)
HDL: 61 mg/dL (ref >=40)
LDL Cholesterol: 112 mg/dL — ABNORMAL HIGH (ref 0–99)
Total CHOL/HDL Ratio: 3.2 Ratio
Triglycerides: 102 mg/dL (ref <150)
VLDL: 20 mg/dL (ref 0–40)

## 2014-06-11 MED ORDER — LORAZEPAM 0.5 MG PO TABS: 0.5000 mg | ORAL_TABLET | Freq: Two times a day (BID) | ORAL | Status: DC | PRN

## 2014-06-11 NOTE — Patient Instructions (Signed)
Because you take care of yourself (good weight, healthy diet and lots of exercise), there is not much for me to do. I will call with your blood test results. I do recommend an 81 mg aspirin daily I am a fan of vasectomies. See me in one year.   I will be happy to refill your Maxalt prior to that - just have the pharmacy contact me.  Congrats dad, grandad

## 2014-06-12 ENCOUNTER — Encounter: Payer: Self-pay | Admitting: Family Medicine

## 2014-06-12 LAB — HIV ANTIBODY (ROUTINE TESTING W REFLEX): HIV 1&2 Ab, 4th Generation: NONREACTIVE

## 2014-06-12 NOTE — Assessment & Plan Note (Signed)
Due for recheck.  10 year risk is 5% and does not qualify for statin.

## 2014-06-12 NOTE — Assessment & Plan Note (Signed)
Healthy male Will bring up to date on screening today. Recommend ASA daily as primary prevention.

## 2014-06-12 NOTE — Assessment & Plan Note (Signed)
No risk factors, purely screen.

## 2014-06-12 NOTE — Progress Notes (Signed)
   Subjective:    Patient ID: Harry Nash, male    DOB: 06/25/1955, 59 y.o.   MRN: [id number]  HPI  Annual wellness exam.   Very healthy lifestyle No tobacco.  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  Wt good.  No self destructive habits.  FHx is most positve for depression and CAD. Issues: 1. CAD risk.  Due for cholesterol check.  Not taking daily ASA.  Encouraged to do so.  Does eat healthy diet. 2. HIV never screened no risk factors.   3. Up to date on immunizations. 4. Colon Ca screen.  Last colonoscopy 2013.  Likely 5 years testing in that he has had previous adenomatous polyps.  Informed likely due 2018 5. Depression - he struggles with it.  Correlates with finances which are not great but ok.  Has therapist.  No SI or HI.  Extra stress is that he is a new dad.  Wants refill of his lorazepam.  Current bottle is 59 years old.  I had disp 30 and he still had a few left. 6. Social.  He and 2nd wife have 80 year old son and now <16year old daughter.  He has grandchildren of a similar age from first marriage.    Review of Systems ROS: No bleeding, SOB, CP, bowel or bladder change, fatique, worrisome skin lesions.     Objective:   Physical ExamHEENT normal Neck supple without masses Lungs clear Cardiac RRR without m or g Abd benign Ext WNL Neurom WNL        Assessment & Plan:

## 2014-06-12 NOTE — Assessment & Plan Note (Signed)
Stable and still well managed by maxalt.  Uses about 6 tabs per month.

## 2014-12-23 ENCOUNTER — Other Ambulatory Visit: Payer: Self-pay | Admitting: *Deleted

## 2014-12-23 DIAGNOSIS — F418 Other specified anxiety disorders: Secondary | ICD-10-CM

## 2014-12-23 MED ORDER — LORAZEPAM 0.5 MG PO TABS: 0.5000 mg | ORAL_TABLET | Freq: Two times a day (BID) | ORAL | Status: DC | PRN

## 2015-02-03 ENCOUNTER — Telehealth: Payer: Self-pay | Admitting: *Deleted

## 2015-02-03 NOTE — Telephone Encounter (Signed)
Received fax from [facility] on [address] stating this patient had received the flu vaccine at their store on 01/30/2015. Added to historical immunizations. Velora Heckler, RN

## 2015-02-17 ENCOUNTER — Encounter (HOSPITAL_COMMUNITY): Payer: Self-pay | Admitting: Emergency Medicine

## 2015-02-17 ENCOUNTER — Emergency Department (HOSPITAL_COMMUNITY)
Admission: EM | Admit: 2015-02-17 | Discharge: 2015-02-17 | Disposition: A | Discharge status: Home / Self Care | Payer: BLUE CROSS/BLUE SHIELD | Attending: Emergency Medicine | Admitting: Emergency Medicine

## 2015-02-17 DIAGNOSIS — Z87891 Personal history of nicotine dependence: Secondary | ICD-10-CM | POA: Diagnosis not present

## 2015-02-17 DIAGNOSIS — N23 Unspecified renal colic: Secondary | ICD-10-CM | POA: Diagnosis not present

## 2015-02-17 DIAGNOSIS — Z87442 Personal history of urinary calculi: Secondary | ICD-10-CM | POA: Insufficient documentation

## 2015-02-17 DIAGNOSIS — F329 Major depressive disorder, single episode, unspecified: Secondary | ICD-10-CM | POA: Diagnosis not present

## 2015-02-17 DIAGNOSIS — R109 Unspecified abdominal pain: Secondary | ICD-10-CM | POA: Diagnosis present

## 2015-02-17 HISTORY — DX: Calculus of kidney: N20.0

## 2015-02-17 LAB — URINE MICROSCOPIC-ADD ON

## 2015-02-17 LAB — URINALYSIS, ROUTINE W REFLEX MICROSCOPIC
BILIRUBIN URINE: NEGATIVE
Glucose, UA: NEGATIVE mg/dL
KETONES UR: NEGATIVE mg/dL
Leukocytes, UA: NEGATIVE
NITRITE: NEGATIVE
PROTEIN: NEGATIVE mg/dL
SPECIFIC GRAVITY, URINE: 1.017 (ref 1.005–1.030)
pH: 5.5 (ref 5.0–8.0)

## 2015-02-17 NOTE — ED Provider Notes (Signed)
CSN: [id number]     Arrival date & time 02/17/15  0433 History   First MD Initiated Contact with Patient 02/17/15 [phone]     Chief Complaint  Patient presents with  . Flank Pain     (Consider location/radiation/quality/duration/timing/severity/associated sxs/prior Treatment) HPI Comments: H/o kidney stones and this is similar Denies fever, emesis Pain on right sided and colicky No gu c/o While waiting in ED, passed stone and now asymptomatic  Patient is a 60 y.o. male presenting with flank pain. The history is provided by the patient.  Flank Pain This is a recurrent problem. The current episode started 3 to 5 hours ago. The problem occurs constantly. The problem has been resolved. Nothing aggravates the symptoms. Nothing relieves the symptoms.    Past Medical History  Diagnosis Date  . Depression   . Headache(784.0)   . Kidney stones    History reviewed. No pertinent past surgical history. Family History  Problem Relation Age of Onset  . Heart disease Mother   . Heart disease Father   . Hypertension Father    Social History  Substance Use Topics  . Smoking status: Former Smoker    Quit date: 02/01/2003  . Smokeless tobacco: None  . Alcohol Use: 8.4 oz/week    14 Glasses of wine per week     Comment: moderate    Review of Systems  Genitourinary: Positive for flank pain.  All other systems reviewed and are negative.     Allergies  Review of patient's allergies indicates no known allergies.  Home Medications   Prior to Admission medications   Medication Sig Start Date End Date Taking? Authorizing Provider  Hydrocodone-Acetaminophen (NORCO PO) Take 1 tablet by mouth every 6 (six) hours as needed (pain).   Yes Historical Provider, MD  ibuprofen (ADVIL,MOTRIN) 200 MG tablet Take 400 mg by mouth every 6 (six) hours as needed (pain).   Yes Historical Provider, MD  rizatriptan (MAXALT) 10 MG tablet TAKE 1 TAB AT ONSET OF SYMPTOMS, MAY REPEAT IN 2 HOURS.DO NOT EXCEED 2  TABS/24 HRS. 05/20/14  Yes [name], MD  LORazepam (ATIVAN) 0.5 MG tablet Take 1 tablet (0.5 mg total) by mouth 2 (two) times daily as needed for anxiety. Patient not taking: Reported on 02/17/2015 12/23/14   [name], MD   BP 132/91 mmHg  Pulse 73  Temp(Src) 97.6 F (36.4 C) (Oral)  Resp 20  Ht 5\' 11"  (1.803 m)  Wt 83.915 kg  BMI 25.81 kg/m2  SpO2 100% Physical Exam  Constitutional: He is oriented to person, place, and time. He appears well-developed and well-nourished.  Non-toxic appearance. No distress.  HENT:  Head: Normocephalic and atraumatic.  Eyes: Conjunctivae, EOM and lids are normal. Pupils are equal, round, and reactive to light.  Neck: Normal range of motion. Neck supple. No tracheal deviation present. No thyroid mass present.  Cardiovascular: Normal rate, regular rhythm and normal heart sounds.  Exam reveals no gallop.   No murmur heard. Pulmonary/Chest: Effort normal and breath sounds normal. No stridor. No respiratory distress. He has no decreased breath sounds. He has no wheezes. He has no rhonchi. He has no rales.  Abdominal: Soft. Normal appearance and bowel sounds are normal. He exhibits no distension. There is no tenderness. There is no rebound and no CVA tenderness.  Musculoskeletal: Normal range of motion. He exhibits no edema or tenderness.  Neurological: He is alert and oriented to person, place, and time. He has normal strength. No cranial nerve deficit  or sensory deficit. GCS eye subscore is 4. GCS verbal subscore is 5. GCS motor subscore is 6.  Skin: Skin is warm and dry. No abrasion and no rash noted.  Psychiatric: He has a normal mood and affect. His speech is normal and behavior is normal.  Nursing note and vitals reviewed.   ED Course  Procedures (including critical care time) Labs Review Labs Reviewed  URINALYSIS, ROUTINE W REFLEX MICROSCOPIC (NOT AT [facility]) - Abnormal; Notable for the following:    Hgb urine dipstick LARGE (*)    All  other components within normal limits  URINE MICROSCOPIC-ADD ON - Abnormal; Notable for the following:    Squamous Epithelial / LPF 0-5 (*)    Bacteria, UA RARE (*)    Casts HYALINE CASTS (*)    All other components within normal limits    Imaging Review No results found. I have personally reviewed and evaluated these images and lab results as part of my medical decision-making.   EKG Interpretation None      MDM   Final diagnoses:  Renal colic    Pt pain free, stone visulaized in specimen cup, will f/u urology   Lacretia Leigh, MD 02/17/15 [phone]

## 2015-02-17 NOTE — ED Notes (Signed)
Pt is c/o right flank pain  Pt has nausea without vomiting  Pt has hx of kidney stones

## 2015-02-17 NOTE — ED Notes (Signed)
Pt strained his urine while giving a sample and passed a stone; pt states feels much better

## 2015-02-17 NOTE — Discharge Instructions (Signed)
Kidney Stones °Kidney stones (urolithiasis) are deposits that form inside your kidneys. The intense pain is caused by the stone moving through the urinary tract. When the stone moves, the ureter goes into spasm around the stone. The stone is usually passed in the urine.  °CAUSES  °· A disorder that makes certain neck glands produce too much parathyroid hormone (primary hyperparathyroidism). °· A buildup of uric acid crystals, similar to gout in your joints. °· Narrowing (stricture) of the ureter. °· A kidney obstruction present at birth (congenital obstruction). °· Previous surgery on the kidney or ureters. °· Numerous kidney infections. °SYMPTOMS  °· Feeling sick to your stomach (nauseous). °· Throwing up (vomiting). °· Blood in the urine (hematuria). °· Pain that usually spreads (radiates) to the groin. °· Frequency or urgency of urination. °DIAGNOSIS  °· Taking a history and physical exam. °· Blood or urine tests. °· CT scan. °· Occasionally, an examination of the inside of the urinary bladder (cystoscopy) is performed. °TREATMENT  °· Observation. °· Increasing your fluid intake. °· Extracorporeal shock wave lithotripsy--This is a noninvasive procedure that uses shock waves to break up kidney stones. °· Surgery may be needed if you have severe pain or persistent obstruction. There are various surgical procedures. Most of the procedures are performed with the use of small instruments. Only small incisions are needed to accommodate these instruments, so recovery time is minimized. °The size, location, and chemical composition are all important variables that will determine the proper choice of action for you. Talk to your health care provider to better understand your situation so that you will minimize the risk of injury to yourself and your kidney.  °HOME CARE INSTRUCTIONS  °· Drink enough water and fluids to keep your urine clear or pale yellow. This will help you to pass the stone or stone fragments. °· Strain  all urine through the provided strainer. Keep all particulate matter and stones for your health care provider to see. The stone causing the pain may be as small as a grain of salt. It is very important to use the strainer each and every time you pass your urine. The collection of your stone will allow your health care provider to analyze it and verify that a stone has actually passed. The stone analysis will often identify what you can do to reduce the incidence of recurrences. °· Only take over-the-counter or prescription medicines for pain, discomfort, or fever as directed by your health care provider. °· Keep all follow-up visits as told by your health care provider. This is important. °· Get follow-up X-rays if required. The absence of pain does not always mean that the stone has passed. It may have only stopped moving. If the urine remains completely obstructed, it can cause loss of kidney function or even complete destruction of the kidney. It is your responsibility to make sure X-rays and follow-ups are completed. Ultrasounds of the kidney can show blockages and the status of the kidney. Ultrasounds are not associated with any radiation and can be performed easily in a matter of minutes. °· Make changes to your daily diet as told by your health care provider. You may be told to: °¨ Limit the amount of salt that you eat. °¨ Eat 5 or more servings of fruits and vegetables each day. °¨ Limit the amount of meat, poultry, fish, and eggs that you eat. °· Collect a 24-hour urine sample as told by your health care provider. You may need to collect another urine sample every 6-12   months. °SEEK MEDICAL CARE IF: °· You experience pain that is progressive and unresponsive to any pain medicine you have been prescribed. °SEEK IMMEDIATE MEDICAL CARE IF:  °· Pain cannot be controlled with the prescribed medicine. °· You have a fever or shaking chills. °· The severity or intensity of pain increases over 18 hours and is not  relieved by pain medicine. °· You develop a new onset of abdominal pain. °· You feel faint or pass out. °· You are unable to urinate. °  °This information is not intended to replace advice given to you by your health care provider. Make sure you discuss any questions you have with your health care provider. °  °Document Released: 01/17/2005 Document Revised: 10/08/2014 Document Reviewed: 06/20/2012 °Elsevier Interactive Patient Education ©2016 [employer] ° °

## 2015-02-18 ENCOUNTER — Ambulatory Visit (INDEPENDENT_AMBULATORY_CARE_PROVIDER_SITE_OTHER): Payer: BLUE CROSS/BLUE SHIELD | Admitting: Family Medicine

## 2015-02-18 ENCOUNTER — Other Ambulatory Visit (INDEPENDENT_AMBULATORY_CARE_PROVIDER_SITE_OTHER): Payer: BLUE CROSS/BLUE SHIELD

## 2015-02-18 ENCOUNTER — Encounter: Payer: Self-pay | Admitting: Family Medicine

## 2015-02-18 VITALS — BP 132/70 | HR 82 | Ht 71.0 in | Wt 187.0 lb

## 2015-02-18 DIAGNOSIS — M25572 Pain in left ankle and joints of left foot: Secondary | ICD-10-CM

## 2015-02-18 DIAGNOSIS — M66872 Spontaneous rupture of other tendons, left ankle and foot: Secondary | ICD-10-CM | POA: Diagnosis not present

## 2015-02-18 DIAGNOSIS — M66879 Spontaneous rupture of other tendons, unspecified ankle and foot: Secondary | ICD-10-CM | POA: Insufficient documentation

## 2015-02-18 MED ORDER — DICLOFENAC SODIUM 2 % TD SOLN: TRANSDERMAL | Status: DC

## 2015-02-18 NOTE — Progress Notes (Signed)
Pre visit review using our clinic review tool, if applicable. No additional management support is needed unless otherwise documented below in the visit note. 

## 2015-02-18 NOTE — Patient Instructions (Signed)
Good to see you Tell lindsay hi Ice the leg 2 times a day but avoid the foot pennsaid pinkie amount topically 2 times daily as needed.  Heel lift in the right shoe at all times, even in the house if you can.  Vitamin D 4000 IU daily to help the bone heal for 2 weeks then as needed Turmeric 500mg  2 times daily  No jumping or running See me again in 2-3 weeks.    Posterior Tibial Tendon Rupture With Rehab Tendons are soft tissues that connect muscle to bone. Tendons allow muscles to move the skeletal system. A complete tear of the posterior tibial tendon is known as a posterior tendon rupture. The posterior tibial tendon attaches the posterior muscles on the inner portion of the back of the lower leg (tibialis muscles) to foot. The posterior tibialis muscles help straighten (plantar flex) and rotate the foot inward (medially rotate) the foot. A posterior tibial rupture will result in a decreased ability to perform these tasks. SYMPTOMS   A "pop" or tear felt and/or heard in the area at the time of injury.  Pain, tenderness, inflammation, and/or bruising (contusion) around the medial inner side of the ankle.  Pain that worsens with dorsiflexion (opposite of plantar flexion) of the foot.  Decreased ankle strength.  Decrease in the prominence of the arch in the sole of the foot. CAUSES  Tendon ruptures occur when a force is placed on the tendon that is greater than it can withstand. Common mechanisms of injury include:  An event that places great stress on the tendon (jumping or starting a sprint).  Direct trauma to the ankle. RISK INCREASES WITH:  Sports that involve forceful and explosive plantar flexion (jumping or quick starts).  Poor strength and flexibility.  Previous injury to the posterior tibial tendon.  Corticosteroid injection into the posterior tibial tendon.  Obesity.  Poor vascular circulation. PREVENTION   Warm up and stretch properly before activity.  Allow  for adequate recovery between workouts.  Maintain physical fitness:  Strength, flexibility, and endurance.  Cardiovascular fitness.  Maintain a healthy body weight.  Arch supports (orthotics), if you have flat feet.  Limit ankle movement by taping, wearing a brace, or using compression bandages. PROGNOSIS  In order to have the highest likelihood of returning to your pre-injury activity level, surgery is usually recommended, with 4 to 9 months of rehabilitation afterward.  RELATED COMPLICATIONS   Decreased ability to plantar flex.  Rerupture of the posterior tibial tendon.  Prolonged disability.  Flat feet.  Arthritis of the foot.  Risks of surgery: infection, bleeding, nerve damage, or damage to surrounding tissues. TREATMENT  Treatment initially involves resting from any activities that aggravate your symptoms. Ice, medication, and elevation can be used to help reduce pain and inflammation. In order to have the best results, surgery is recommended. Surgery involves sewing (suturing) the ends of the torn tendon back together. If your surgeon cannot repair the tendon, then a replacement (reconstruction) surgery may be performed to use another tendon to replace the function of the ruptured tendon. After surgery, the ankle is immobilized in order to allow the tendon to heal. After immobilization, it is important to perform strengthening and stretching exercises to help regain strength and a full range of motion. These exercises may be completed at home or with a therapist. MEDICATION   Nonsteroidal anti-inflammatory medications, such as aspirin and ibuprofen (do not take within 7 days before surgery), or other minor pain relievers, such as acetaminophen,  are often recommended. Take these as directed by your caregiver. Contact your caregiver immediately if any bleeding, stomach upset, or signs of an allergic reaction occur.  Pain relievers may be prescribed as necessary by your  caregiver. Use only as directed and only as much as you need. SEEK MEDICAL CARE IF:  Treatment seems to offer no benefit, or the condition worsens.  Any medications produce adverse side effects.  Any complications from surgery occur:  Pain, numbness, or coldness in the extremity operated upon.  Discoloration of the nail beds (they become blue or gray) of the extremity operated upon.  Signs of infections (fever, pain, inflammation, redness, or persistent bleeding). EXERCISES RANGE OF MOTION (ROM) AND STRETCHING EXERCISES - Posterior Tibial Tendon Rupture These exercises may help you when beginning to rehabilitate your injury. Your symptoms may resolve with or without further involvement from your physician, physical therapist, or athletic trainer. While completing these exercises, remember:   Restoring tissue flexibility helps normal motion to return to the joints. This allows healthier, less painful movement and activity.  An effective stretch should be held for at least 30 seconds.  A stretch should never be painful. You should only feel a gentle lengthening or release in the stretched tissue. STRETCH - Gastrocsoleus   Sit with your right / left leg extended. Holding onto both ends of a belt or towel, loop it around the ball of your foot.  Keeping your right / left ankle and foot relaxed and your knee straight, pull your foot and ankle toward you using the belt/towel.  You should feel a gentle stretch behind your calf or knee. Hold this position for __________ seconds. Repeat __________ times. Complete this stretch __________. times per day.  RANGE OF MOTION - Dorsi/Plantar Flexion  While sitting with your right / left knee straight, draw the top of your foot upwards by flexing your ankle. Then reverse the motion, pointing your toes downward.  Hold each position for __________ seconds.  After completing your first set of exercises, repeat this exercise with your knee  bent. Repeat __________ times. Complete this exercise __________ times per day.  RANGE OF MOTION - Ankle Plantar Flexion   Sit with your right / left leg crossed over your opposite knee.  Use your opposite hand to pull the top of your foot and toes toward you.  You should feel a gentle stretch on the top of your foot/ankle. Hold this position for __________ seconds. Repeat __________ times. Complete __________ times per day.  RANGE OF MOTION - Ankle Eversion   Sit with your right / left ankle crossed over your opposite knee.  Grip your foot with your opposite hand, placing your thumb on the top of your foot and your fingers across the bottom of your foot.  Gently push your foot downward with a slight rotation so your littlest toes rise slightly.  You should feel a gentle stretch on the inside of your ankle. Hold the stretch for __________ seconds. Repeat __________ times. Complete this exercise __________ times per day.  RANGE OF MOTION - Ankle Inversion   Sit with your right / left ankle crossed over your opposite knee.  Grip your foot with your opposite hand, placing your thumb on the bottom of your foot and your fingers across the top of your foot.  Gently pull your foot so the smallest toe comes toward you and your thumb pushes the inside of the ball of your foot away from you.  You should feel a  gentle stretch on the outside of your ankle. Hold the stretch for __________ seconds. Repeat __________ times. Complete this exercise __________ times per day.  RANGE OF MOTION - Ankle Alphabet  Imagine your right / left big toe is a pen.  Keeping your hip and knee still, write out the entire alphabet with your "pen." Make the letters as large as you can without increasing any discomfort. Repeat __________ times. Complete this exercise __________ times per day.  STRENGTHENING EXERCISES - Posterior Tibial Tendon Rupture These exercises may help you when beginning to rehabilitate your  injury. They may resolve your symptoms with or without further involvement from your physician, physical therapist, or athletic trainer. While completing these exercises, remember:   Muscles can gain both the endurance and the strength needed for everyday activities through controlled exercises.  Complete these exercises as instructed by your physician, physical therapist, or athletic trainer. Progress the resistance and repetitions only as guided. STRENGTH - Dorsiflexors  Secure a rubber exercise band/tubing to a fixed object (table, pole) and loop the other end around your right / left foot.  Sit on the floor facing the fixed object. The band/tubing should be slightly tense when your foot is relaxed.  Slowly draw your foot back toward you using your ankle and toes.  Hold this position for __________ seconds. Slowly release the tension in the band and return your foot to the starting position. Repeat __________ times. Complete this exercise __________ times per day.  STRENGTH - Plantar Flexors   Sit with your right / left leg extended. Holding onto both ends of a rubber exercise band/tubing, loop it around the ball of your foot. Keep a slight tension in the band.  Slowly push your toes away from you, pointing them downward.  Hold this position for __________ seconds. Return slowly, controlling the tension in the band/tubing. Repeat __________ times. Complete this exercise __________ times per day.  STRENGTH - Towel Curls  Sit in a chair positioned on a non-carpeted surface.  Place your foot on a towel, keeping your heel on the floor.  Pull the towel toward your heel by only curling your toes. Keep your heel on the floor.  If instructed by your physician, physical therapist or athletic trainer, add ____________________ at the end of the towel. Repeat __________ times. Complete this exercise __________ times per day. STRENGTH - Ankle Inversion   Secure one end of a rubber exercise  band/tubing to a fixed object (table, pole). Loop the other end around your foot just before your toes.  Place your fists between your knees. This will focus your strengthening at your ankle.  Slowly, pull your big toe up and in, making sure the band/tubing is positioned to resist the entire motion.  Hold this position for __________ seconds.  Have your muscles resist the band/tubing as it slowly pulls your foot back to the starting position. Repeat __________ times. Complete this exercise __________ times per day.    This information is not intended to replace advice given to you by your health care provider. Make sure you discuss any questions you have with your health care provider.   Document Released: 01/17/2005 Document Revised: 10/08/2014 Document Reviewed: 05/01/2008 Elsevier Interactive Patient Education [employer].

## 2015-02-18 NOTE — Assessment & Plan Note (Signed)
Patient does have a tear but it does seem to be intersubstance. Patient is able to bear weight at this moment. Discussed with patient about different treatment options including a possible Cam Walker and further imaging. Patient declined those at this time. We discussed a compression sock, heel lift, topical anti-inflammatories that were prescribed, and an icing protocol. Home exercises given today. Patient work with Product/process development scientist. Discussed which activities to avoid. Patient will come back and see me again in 3-4 weeks to make sure that he is responding appropriately.

## 2015-02-18 NOTE — Progress Notes (Signed)
[facility] [address] [place], [place] [address] Phone: [phone] Subjective:     CC: left ankle pain QA:[id number] Harry [facility] is a 60 y.o. male coming in with complaint of  left ankle pain. Patient states that when he was sliding 10 days ago he was going up a hill and had an auto pop in the left ankle. Had bruising and swelling initially. Had difficulty with ambulation for 48 hours. Patient then was able to start walking on a more regular basis. Patient states that he continues to walk with a limp. Patient is a Medical laboratory scientific officer and finds it difficult to show certain things such as significant plantarflexion of the foot or any type of jumps at the moment. Pain seems to be on the most lower aspect of the ankle at the foot. States that there is still some swelling. States that there is a popping sound that sometimes can be uncomfortable. No nighttime awakening. No numbness. Rates the severity of pain still as 6 out of 10.     Past Medical History  Diagnosis Date  . Depression   . Headache(784.0)   . Kidney stones    No past surgical history on file. Social History   Social History  . Marital Status: Married    Spouse Name: N/A  . Number of Children: N/A  . Years of Education: N/A   Social History Main Topics  . Smoking status: Former Smoker    Quit date: 02/01/2003  . Smokeless tobacco: None  . Alcohol Use: 8.4 oz/week    14 Glasses of wine per week     Comment: moderate  . Drug Use: No  . Sexual Activity: Yes   Other Topics Concern  . None   Social History Narrative   No Known Allergies Family History  Problem Relation Age of Onset  . Heart disease Mother   . Heart disease Father   . Hypertension Father     Past medical history, social, surgical and family history all reviewed in electronic medical record.  No pertanent information unless stated regarding to the chief complaint.   Review of Systems: No headache, visual  changes, nausea, vomiting, diarrhea, constipation, dizziness, abdominal pain, skin rash, fevers, chills, night sweats, weight loss, swollen lymph nodes, body aches, joint swelling, muscle aches, chest pain, shortness of breath, mood changes.   Objective Blood pressure 132/70, pulse 82, height 5\' 11"  (1.803 m), weight 187 lb (84.823 kg), SpO2 96 %.  General: No apparent distress alert and oriented x3 mood and affect normal, dressed appropriately.  HEENT: Pupils equal, extraocular movements intact  Respiratory: Patient's speak in full sentences and does not appear short of breath  Cardiovascular: No lower extremity edema, non tender, no erythema  Skin: Warm dry intact with no signs of infection or rash on extremities or on axial skeleton.  Abdomen: Soft nontender  Neuro: Cranial nerves II through XII are intact, neurovascularly intact in all extremities with 2+ DTRs and 2+ pulses.  Lymph: No lymphadenopathy of posterior or anterior cervical chain or axillae bilaterally.  Gait mild antalgic gait  MSK:  Non tender with full range of motion and good stability and symmetric strength and tone of shoulders, elbows, wrist, hip, knees bilaterally.  Ankle: Left Mild swelling on the medial aspect of the ankle. Range of motion is full in all directions. Strength is 4/5 in all directions. Stable lateral and medial ligaments; squeeze test and kleiger test unremarkable; Talar dome nontender; No pain  at base of 5th MT; No tenderness over cuboid; Mild discomfort over the navicular prominence Patient does have tenderness over the posterior tibialis tendon No sign of peroneal tendon subluxations or tenderness to palpation Negative tarsal tunnel tinel's Able to walk 4 steps.  MSK US performed of: Left ankle This study was ordered, performed, and interpreted by Charlann Boxer D.O.  Foot/Ankle:   All structures visualized.   Talar dome unremarkable  Ankle mortise without effusion. Peroneus longus and brevis  tendons unremarkable on long and transverse views without sheath effusions. Posterior tibialis has significant hypoechoic changes and does have what appears to be an intersubstance tear. Approximately 0.34 cm in diameter. Increasing Doppler flow noted. This is distal to the medial malleolus. Patient also has what appears to be a very small avulsion fracture at its insertion. Minimal dispersement noted. Increasing Doppler flow noted. Achilles tendon visualized along length of tendon and unremarkable on long and transverse views without sheath effusion. Anterior Talofibular Ligament and Calcaneofibular Ligaments unremarkable and intact. Deltoid Ligament unremarkable and intact. Plantar fascia intact and without effusion, normal thickness. No increased doppler signal, cap sign, or thickening of tibial cortex. Power doppler signal normal.  IMPRESSION: Intersubstance posterior tibialis tear with possible avulsion fracture  Procedure note [id number]; 15 minutes spent for Therapeutic exercises as stated in above notes.  This included exercises focusing on stretching, strengthening, with significant focus on eccentric aspects. Stretches to help lengthen the lower leg and plantar fascia areas Theraband exercises for the lower leg and ankle to help strengthen the surrounding area- dorsiflexion, plantarflexion, inversion, eversion Massage rolling on the plantar surface of the foot with a frozen bottle, tennis ball or golf ball Towel or marble pick-ups to strengthen the plantar surface of the foot Weight bearing exercises to increase balance and overall stability  Proper technique shown and discussed handout in great detail with ATC.  All questions were discussed and answered.     Impression and Recommendations:     This case required medical decision making of moderate complexity.      Note: This dictation was prepared with Dragon dictation along with smaller phrase technology. Any transcriptional errors  that result from this process are unintentional.

## 2015-03-11 ENCOUNTER — Ambulatory Visit (INDEPENDENT_AMBULATORY_CARE_PROVIDER_SITE_OTHER): Payer: BLUE CROSS/BLUE SHIELD | Admitting: Family Medicine

## 2015-03-11 ENCOUNTER — Encounter: Payer: Self-pay | Admitting: Family Medicine

## 2015-03-11 ENCOUNTER — Other Ambulatory Visit (INDEPENDENT_AMBULATORY_CARE_PROVIDER_SITE_OTHER): Payer: BLUE CROSS/BLUE SHIELD

## 2015-03-11 VITALS — BP 132/80 | HR 79 | Wt 185.0 lb

## 2015-03-11 DIAGNOSIS — M66872 Spontaneous rupture of other tendons, left ankle and foot: Secondary | ICD-10-CM | POA: Diagnosis not present

## 2015-03-11 NOTE — Assessment & Plan Note (Signed)
Patient is making some progress. Patient given different home exercises for strengthening of the think will be beneficial. We discussed proper shoes. We discussed continuing over-the-counter medications. Patient and will come back again in 4 weeks for further evaluation and treatment.

## 2015-03-11 NOTE — Progress Notes (Signed)
[name] [address] [place], [place] [address] Phone: [phone] Subjective:     CC: left ankle pain follow-up [id number] Harry Nash is a 60 y.o. male coming in with complaint of  left ankle pain. Patient was found to have a posterior tibialis tear at last visit. There was concern for possible avulsion fracture of the navicular as well. Patient elected to not wear the boot on a regular basis. Patient has been in regular shoes and has continued to dance very slowly. Patient has noticed some discomfort but no synovium pain. All the swelling has improved. Topical anti-inflammatory has been helpful. Patient states that the over-the-counter medications we discussed beforehand as help as well. Patient states that he is 70% better.    Past Medical History  Diagnosis Date  . Depression   . Headache(784.0)   . Kidney stones    No past surgical history on file. Social History   Social History  . Marital Status: Married    Spouse Name: N/A  . Number of Children: N/A  . Years of Education: N/A   Social History Main Topics  . Smoking status: Former Smoker    Quit date: 02/01/2003  . Smokeless tobacco: None  . Alcohol Use: 8.4 oz/week    14 Glasses of wine per week     Comment: moderate  . Drug Use: No  . Sexual Activity: Yes   Other Topics Concern  . None   Social History Narrative   No Known Allergies Family History  Problem Relation Age of Onset  . Heart disease Mother   . Heart disease Father   . Hypertension Father     Past medical history, social, surgical and family history all reviewed in electronic medical record.  No pertanent information unless stated regarding to the chief complaint.   Review of Systems: No headache, visual changes, nausea, vomiting, diarrhea, constipation, dizziness, abdominal pain, skin rash, fevers, chills, night sweats, weight loss, swollen lymph nodes, body aches, joint swelling, muscle aches, chest  pain, shortness of breath, mood changes.   Objective Blood pressure 132/80, pulse 79, weight 185 lb (83.915 kg), SpO2 97 %.  General: No apparent distress alert and oriented x3 mood and affect normal, dressed appropriately.  HEENT: Pupils equal, extraocular movements intact  Respiratory: Patient's speak in full sentences and does not appear short of breath  Cardiovascular: No lower extremity edema, non tender, no erythema  Skin: Warm dry intact with no signs of infection or rash on extremities or on axial skeleton.  Abdomen: Soft nontender  Neuro: Cranial nerves II through XII are intact, neurovascularly intact in all extremities with 2+ DTRs and 2+ pulses.  Lymph: No lymphadenopathy of posterior or anterior cervical chain or axillae bilaterally.  Gait mild antalgic gait  MSK:  Non tender with full range of motion and good stability and symmetric strength and tone of shoulders, elbows, wrist, hip, knees bilaterally.  Ankle: Left No swelling noted Range of motion is full in all directions. Strength is 4/5 in all directions. Stable lateral and medial ligaments; squeeze test and kleiger test unremarkable; Talar dome nontender; No pain at base of 5th MT; No tenderness over cuboid; Nontender over the navicular prominence Patient does have tenderness over the posterior tibialis tendon still present No sign of peroneal tendon subluxations or tenderness to palpation Negative tarsal tunnel tinel's Able to walk 4 steps. Contralateral ankle unremarkable  MSK US performed of: Left ankle This study was ordered, performed, and interpreted  by Charlann Boxer D.O.  Foot/Ankle:   All structures visualized.   Talar dome unremarkable  Ankle mortise without effusion. Peroneus longus and brevis tendons unremarkable on long and transverse views without sheath effusions. Posterior tibialis has significant decrease in hypoechoic changes. Where patient did have intersubstance tearing scar tissue formation is  noted.  Continued increase in Doppler flow. This is distal to the medial malleolus. Mild avulsion fracture is noted but does have some callus formation.. Minimal dispersement noted. Increasing Doppler flow noted. Achilles tendon visualized along length of tendon and unremarkable on long and transverse views without sheath effusion. Anterior Talofibular Ligament and Calcaneofibular Ligaments unremarkable and intact. Deltoid Ligament unremarkable and intact. Plantar fascia intact and without effusion, normal thickness. No increased doppler signal, cap sign, or thickening of tibial cortex. Power doppler signal normal.  IMPRESSION: Healing intersubstance tear of the posterior tibialis tendon with mild healing of the avulsion fracture.      Impression and Recommendations:     This case required medical decision making of moderate complexity.      Note: This dictation was prepared with Dragon dictation along with smaller phrase technology. Any transcriptional errors that result from this process are unintentional.

## 2015-03-11 NOTE — Patient Instructions (Signed)
Good to see you  You are doing amazing Increase vitamin D to 4000 IU daily COntinue the exercises on your off days.  Continue the turmeric  [employer] orthotics "total support" online would be great in your regular shoes See me again in 4-6 weeks to make sure fully healed.

## 2015-04-08 ENCOUNTER — Encounter: Payer: Self-pay | Admitting: Family Medicine

## 2015-04-08 ENCOUNTER — Ambulatory Visit (INDEPENDENT_AMBULATORY_CARE_PROVIDER_SITE_OTHER): Payer: BLUE CROSS/BLUE SHIELD | Admitting: Family Medicine

## 2015-04-08 ENCOUNTER — Ambulatory Visit (INDEPENDENT_AMBULATORY_CARE_PROVIDER_SITE_OTHER)
Admission: RE | Admit: 2015-04-08 | Discharge: 2015-04-08 | Disposition: A | Discharge status: Home / Self Care | Payer: BLUE CROSS/BLUE SHIELD | Source: Ambulatory Visit | Attending: Family Medicine | Admitting: Family Medicine

## 2015-04-08 ENCOUNTER — Other Ambulatory Visit (INDEPENDENT_AMBULATORY_CARE_PROVIDER_SITE_OTHER): Payer: BLUE CROSS/BLUE SHIELD

## 2015-04-08 VITALS — BP 122/80 | HR 77 | Ht 71.0 in | Wt 186.0 lb

## 2015-04-08 DIAGNOSIS — M545 Low back pain, unspecified: Secondary | ICD-10-CM

## 2015-04-08 DIAGNOSIS — M9903 Segmental and somatic dysfunction of lumbar region: Secondary | ICD-10-CM

## 2015-04-08 DIAGNOSIS — M722 Plantar fascial fibromatosis: Secondary | ICD-10-CM

## 2015-04-08 DIAGNOSIS — M542 Cervicalgia: Secondary | ICD-10-CM

## 2015-04-08 DIAGNOSIS — M5030 Other cervical disc degeneration, unspecified cervical region: Secondary | ICD-10-CM | POA: Diagnosis not present

## 2015-04-08 DIAGNOSIS — M549 Dorsalgia, unspecified: Secondary | ICD-10-CM | POA: Insufficient documentation

## 2015-04-08 DIAGNOSIS — M9908 Segmental and somatic dysfunction of rib cage: Secondary | ICD-10-CM

## 2015-04-08 DIAGNOSIS — M999 Biomechanical lesion, unspecified: Secondary | ICD-10-CM

## 2015-04-08 DIAGNOSIS — M9901 Segmental and somatic dysfunction of cervical region: Secondary | ICD-10-CM

## 2015-04-08 DIAGNOSIS — M66872 Spontaneous rupture of other tendons, left ankle and foot: Secondary | ICD-10-CM

## 2015-04-08 MED ORDER — TIZANIDINE HCL 4 MG PO TABS: 4.0000 mg | ORAL_TABLET | Freq: Every evening | ORAL | Status: DC

## 2015-04-08 MED ORDER — MELOXICAM 15 MG PO TABS: 15.0000 mg | ORAL_TABLET | Freq: Every day | ORAL | Status: DC

## 2015-04-08 NOTE — Assessment & Plan Note (Signed)
Decision today to treat with OMT was based on Physical Exam  After verbal consent patient was treated with HVLA, ME, FPR techniques in local, thoracic, rib, lumbar areas  Patient tolerated the procedure well with improvement in symptoms  Patient given exercises, stretches and lifestyle modifications  See medications in patient instructions if given  Patient will follow up in 3 weeks

## 2015-04-08 NOTE — Progress Notes (Signed)
[name] [place], [place] [address] Phone: [phone] Subjective:     CC: left ankle pain follow-up [id number] Harry Nash is a 60 y.o. male coming in with complaint of  left ankle pain. Patient was found to have a posterior tibialis tear at last visit. There was concern for possible avulsion fracture of the navicular as well.   Patient was responding significant better to conservative therapy. Patient is on weekly vitamin D supplementation. Patient is doing over-the-counter orthotics. Patient had been increasing his activity very safely. Patient has been even dancing. Patient states.D pain on the medial aspect of the ankle is significantly better. Patient is having more pain at the plantar aspect of the heel. States that is worse with first steps in the morning and after sitting for long amount of time. Seems to get better with activity. Denies any numbness. Denies any weakness. Patient states that the ankle itself feels much better though. Patient has been walking around barefoot is significantly more and has stopped doing any other exercises.  Patient is also complaining the can back pain. Patient states that it has started to become severe. Patient states that he continues to work through the pain but states that sometimes he can even feel tearful secondary to the discomfort. Has not taken any over-the-counter medications. Sometimes soreness of the can keep him up at night. Rates the severity of 8 out of 10. Patient is concerned because he needs to continue to work and be active.    Past Medical History  Diagnosis Date  . Depression   . Headache(784.0)   . Kidney stones    No past surgical history on file. Social History   Social History  . Marital Status: Married    Spouse Name: N/A  . Number of Children: N/A  . Years of Education: N/A   Social History Main Topics  . Smoking status: Former Smoker    Quit date: 02/01/2003  .  Smokeless tobacco: None  . Alcohol Use: 8.4 oz/week    14 Glasses of wine per week     Comment: moderate  . Drug Use: No  . Sexual Activity: Yes   Other Topics Concern  . None   Social History Narrative   No Known Allergies Family History  Problem Relation Age of Onset  . Heart disease Mother   . Heart disease Father   . Hypertension Father     Past medical history, social, surgical and family history all reviewed in electronic medical record.  No pertanent information unless stated regarding to the chief complaint.   Review of Systems: No headache, visual changes, nausea, vomiting, diarrhea, constipation, dizziness, abdominal pain, skin rash, fevers, chills, night sweats, weight loss, swollen lymph nodes, body aches, joint swelling, muscle aches, chest pain, shortness of breath, mood changes.   Objective Blood pressure 122/80, pulse 77, height 5\' 11"  (1.803 m), weight 186 lb (84.369 kg), SpO2 96 %.  General: No apparent distress alert and oriented x3 mood and affect normal, dressed appropriately.  HEENT: Pupils equal, extraocular movements intact  Respiratory: Patient's speak in full sentences and does not appear short of breath  Cardiovascular: No lower extremity edema, non tender, no erythema  Skin: Warm dry intact with no signs of infection or rash on extremities or on axial skeleton.  Abdomen: Soft nontender  Neuro: Cranial nerves II through XII are intact, neurovascularly intact in all extremities with 2+ DTRs and 2+ pulses.  Lymph: No lymphadenopathy  of posterior or anterior cervical chain or axillae bilaterally.  Gait mild antalgic gait  MSK:  Non tender with full range of motion and good stability and symmetric strength and tone of shoulders, elbows, wrist, hip, knees bilaterally.  Neck: Inspection unremarkable. No palpable stepoffs. Negative Spurling's maneuver. Lacks last 5 of extension as well as the last 10 of side bending bilaterally Grip strength and  sensation normal in bilateral hands Strength good C4 to T1 distribution No sensory change to C4 to T1 Negative Hoffman sign bilaterally Reflexes normal  Back Exam:  Inspection: Unremarkable  Motion: Flexion 45 deg, Extension 25 deg, Side Bending to 35 deg bilaterally,  Rotation to 35 deg bilaterally  SLR laying: Negative  XSLR laying: Negative  Palpable tenderness: mild paraspinal tenderness of the thoracolumbar junction. FABER: negative. Sensory change: Gross sensation intact to all lumbar and sacral dermatomes.  Reflexes: 2+ at both patellar tendons, 2+ at achilles tendons, Babinski's downgoing.  Strength at foot  Plantar-flexion: 5/5 Dorsi-flexion: 5/5 Eversion: 5/5 Inversion: 5/5  Leg strength  Quad: 5/5 Hamstring: 5/5 Hip flexor: 5/5 Hip abductors: 4/5 but symmetric Gait unremarkable.   Ankle: Left No swelling noted Range of motion is full in all directions. Strength is 5/5 in all directionswhich is an improvement. Stable lateral and medial ligaments; squeeze test and kleiger test unremarkable; Talar dome nontender; No pain at base of 5th MT; No tenderness over cuboid; Nontender over the navicular prominence Patient does have tenderness over the posterior tibialis tendon still present No sign of peroneal tendon subluxations or tenderness to palpation Negative tarsal tunnel tinel's Able to walk 4 steps. Contralateral ankle unremarkable  MSK US performed of: Left ankle This study was ordered, performed, and interpreted by Charlann Boxer D.O.  Foot/Ankle:   All structures visualized.   Talar dome unremarkable  Ankle mortise without effusion. Peroneus longus and brevis tendons unremarkable on long and transverse views without sheath effusions. Posterior tibialis relatively normal with very minimal hypoechoic changes noted. Avulsion fracture seems to be healing appropriately Achilles tendon visualized along length of tendon and unremarkable on long and transverse views  without sheath effusion. Anterior Talofibular Ligament and Calcaneofibular Ligaments unremarkable and intact. Deltoid Ligament unremarkable and intact. Plantar fascia does have surrounding hypoechoic changes but no significant thickening. Patient though does have what appears to be an intersubstance tear with increasing Doppler flow. Power doppler signal normal.  IMPRESSION: plantar fasciitis with possible intersubstance tear  Osteopathic findings C4 flexed rotated and side bent right T1 extended rotated and side bent right with elevated first rib T3 extended rotated and side bent left L2 flexed rotated and side bent right    Impression and Recommendations:     This case required medical decision making of moderate complexity.      Note: This dictation was prepared with Dragon dictation along with smaller phrase technology. Any transcriptional errors that result from this process are unintentional.

## 2015-04-08 NOTE — Assessment & Plan Note (Signed)
Healed at this time. 

## 2015-04-08 NOTE — Progress Notes (Signed)
Pre visit review using our clinic review tool, if applicable. No additional management support is needed unless otherwise documented below in the visit note. 

## 2015-04-08 NOTE — Assessment & Plan Note (Signed)
I do believe the patient's back pain is likely multifactorial. Patient given anti-inflammatory to have on hand as well as a muscle relaxer to take at night. We discussed imaging and x-rays are pending. I'm discuss that there is likely some underlying arthritis. We discussed over-the-counter medications a could be helpful. Patient did respond well to osteopathic manipulation. Core strengthening exercises given today. Patient will follow-up and see me again in 3 weeks.

## 2015-04-08 NOTE — Patient Instructions (Addendum)
Good to see you  We will get xrays downstairs today  Alternate exercises for the foot and the back 3 times a week ./zpos Keep shoulders back at the computer.  Monitor at eye level.  Meloxicam daily for 10 days Zanaflex at night to help with sleep and muscle tightness.  [employer] orthotics "total support" online would be great Put them in any shoes Avoid being barefoot at all times See me again in 3 weeks.

## 2015-04-08 NOTE — Assessment & Plan Note (Signed)
Plantar Fascitis: We reviewed that stretching is critically important to the treatment of PF. Reviewed footwear. Rigid soles have been shown to help with PF. Night splints can help. Reviewed rehab of stretching and calf raises.  Could benefit from a corticosteroid injection, orthotics, or other measures if conservative treatment fails.  

## 2015-04-10 ENCOUNTER — Telehealth: Payer: Self-pay | Admitting: *Deleted

## 2015-04-10 MED ORDER — MELOXICAM 15 MG PO TABS: 15.0000 mg | ORAL_TABLET | Freq: Every day | ORAL | Status: DC

## 2015-04-10 MED ORDER — TIZANIDINE HCL 4 MG PO TABS: 4.0000 mg | ORAL_TABLET | Freq: Every evening | ORAL | Status: DC

## 2015-04-10 NOTE — Telephone Encounter (Signed)
Pete receive call from pt state [address] [facility] never receive scripts from 04/08/15, Per chart MD sent to [facility] pt is wanting rx's sent to [facility]. Resent to correct pharmacy...Johny Chess

## 2015-04-15 ENCOUNTER — Telehealth: Payer: Self-pay | Admitting: Family Medicine

## 2015-04-15 MED ORDER — PREDNISONE 50 MG PO TABS: 50.0000 mg | ORAL_TABLET | Freq: Every day | ORAL | Status: DC

## 2015-04-15 MED ORDER — VITAMIN D (ERGOCALCIFEROL) 1.25 MG (50000 UNIT) PO CAPS: 50000.0000 [IU] | ORAL_CAPSULE | ORAL | Status: DC

## 2015-04-15 NOTE — Telephone Encounter (Signed)
Patient called to advise that he is not seeing any improvement since the last visit. He is requesting your opinion on how to proceed. Please give hi ma call.

## 2015-04-15 NOTE — Telephone Encounter (Signed)
Would do prednsone daily for 5 days.  Once weekly vitamin D instead of daily sent in rx.  Sent in to pharmacy will decrease pain  See me again soon.

## 2015-04-15 NOTE — Telephone Encounter (Signed)
Discussed with pt. Scheduled him for 04/20/15 @230p .

## 2015-04-16 ENCOUNTER — Ambulatory Visit (INDEPENDENT_AMBULATORY_CARE_PROVIDER_SITE_OTHER): Payer: BLUE CROSS/BLUE SHIELD | Admitting: Family Medicine

## 2015-04-16 ENCOUNTER — Encounter: Payer: Self-pay | Admitting: Family Medicine

## 2015-04-16 VITALS — BP 124/82 | HR 70 | Ht 71.0 in | Wt 188.0 lb

## 2015-04-16 DIAGNOSIS — M5030 Other cervical disc degeneration, unspecified cervical region: Secondary | ICD-10-CM

## 2015-04-16 DIAGNOSIS — M545 Low back pain, unspecified: Secondary | ICD-10-CM

## 2015-04-16 MED ORDER — TRAMADOL HCL 50 MG PO TABS: 50.0000 mg | ORAL_TABLET | Freq: Two times a day (BID) | ORAL | Status: DC | PRN

## 2015-04-16 MED ORDER — GABAPENTIN 100 MG PO CAPS: 200.0000 mg | ORAL_CAPSULE | Freq: Every day | ORAL | Status: DC

## 2015-04-16 NOTE — Progress Notes (Signed)
[facility] [place], [place] [address] Phone: [phone] Subjective:     CC: Back pain follow-up [id number] Harry Nash is a 60 y.o. male coming in with complaint of  lower back pain and neck pain. Patient does have some arthritic changes  Patient's x-rays were ended badly visualized by me. Patient's is complaining mostly of neck pain. Patient did call last week stating that he is having worsening symptoms. Seem to be on the right side. Having radiation down the right arm. Denies any weakness. States that the pain is unrelenting. Patient was sent and prednisone which he did not pick up. Patient states that the pain is waking him up at night. Even associated with some mild headache. Patient rates the severity of pain as 9 out of 10. Patient is very difficult to concentrate. Has not responded well to a muscle relaxer that he has.  neck x-rays show diffuse osteopenia degenerative changes at multiple levels of the cervical spine.  Past Medical History  Diagnosis Date  . Depression   . Headache(784.0)   . Kidney stones    No past surgical history on file. Social History   Social History  . Marital Status: Married    Spouse Name: N/A  . Number of Children: N/A  . Years of Education: N/A   Social History Main Topics  . Smoking status: Former Smoker    Quit date: 02/01/2003  . Smokeless tobacco: Not on file  . Alcohol Use: 8.4 oz/week    14 Glasses of wine per week     Comment: moderate  . Drug Use: No  . Sexual Activity: Yes   Other Topics Concern  . Not on file   Social History Narrative   No Known Allergies Family History  Problem Relation Age of Onset  . Heart disease Mother   . Heart disease Father   . Hypertension Father     Past medical history, social, surgical and family history all reviewed in electronic medical record.  No pertanent information unless stated regarding to the chief complaint.   Review of  Systems: No headache, visual changes, nausea, vomiting, diarrhea, constipation, dizziness, abdominal pain, skin rash, fevers, chills, night sweats, weight loss, swollen lymph nodes, body aches, joint swelling, muscle aches, chest pain, shortness of breath, mood changes.   Objective There were no vitals taken for this visit.  General: No apparent distress alert and oriented x3 mood and affect normal, dressed appropriately.  HEENT: Pupils equal, extraocular movements intact  Respiratory: Patient's speak in full sentences and does not appear short of breath  Cardiovascular: No lower extremity edema, non tender, no erythema  Skin: Warm dry intact with no signs of infection or rash on extremities or on axial skeleton.  Abdomen: Soft nontender  Neuro: Cranial nerves II through XII are intact, neurovascularly intact in all extremities with 2+ DTRs and 2+ pulses.  Lymph: No lymphadenopathy of posterior or anterior cervical chain or axillae bilaterally.  Gait mild antalgic gait  MSK:  Non tender with full range of motion and good stability and symmetric strength and tone of shoulders, elbows, wrist, hip, knees bilaterally.  Neck: Inspection unremarkable. No palpable stepoffs. Positive Spurling's with radicular symptoms going to the C8 distribution. Involuntary guarding of multiple range of motion especially with side bending and rotation especially to the left.  Grip strength and sensation normal in bilateral hands Strength good C4 to T1 distribution No sensory change to C4 to T1 Negative Hoffman  sign bilaterally Reflexes normal  Back Exam:  Inspection: Unremarkable  Motion: Flexion 45 deg, Extension 25 deg, Side Bending to 35 deg bilaterally,  Rotation to 35 deg bilaterally  SLR laying: Negative  XSLR laying: Negative  Palpable tenderness:Nontender  FABER: negative. Sensory change: Gross sensation intact to all lumbar and sacral dermatomes.  Reflexes: 2+ at both patellar tendons, 2+ at  achilles tendons, Babinski's downgoing.  Strength at foot  Plantar-flexion: 5/5 Dorsi-flexion: 5/5 Eversion: 5/5 Inversion: 5/5  Leg strength  Quad: 5/5 Hamstring: 5/5 Hip flexor: 5/5 Hip abductors: 4/5 but symmetric Gait unremarkable.     Impression and Recommendations:     This case required medical decision making of moderate complexity.      Note: This dictation was prepared with Dragon dictation along with smaller phrase technology. Any transcriptional errors that result from this process are unintentional.

## 2015-04-16 NOTE — Assessment & Plan Note (Signed)
Seems stable at this time °

## 2015-04-16 NOTE — Patient Instructions (Signed)
Good to see you  Heat 10 minutes then ice 10 minutes and repeat 3-4 times a day  Prednisone daily for 5 days Gabapentin 100-200mg  at night Tramadol 50 mg up to 2 times a day for pain  Can still use xanax if needed Send me a message on Monday and tell me how you are doing.  We will then consider if we need a MRI or not.

## 2015-04-16 NOTE — Assessment & Plan Note (Signed)
Patient is even having radicular symptoms. Did discuss him at great length. Patient given prescription for gabapentin as well as prednisone. Patient given a prescription for tramadol for any breakthrough pain. We discussed avoiding significant overhead activities and will take 72 hours off from the home exercises. We will see how patient response to this. If patient continues to have radicular symptoms or any weakness we discussed that advance imaging would be warranted. Patient will be following up again in the next 2 weeks.  Spent  25 minutes with patient face-to-face and had greater than 50% of counseling including as described above in assessment and plan.

## 2015-04-16 NOTE — Progress Notes (Signed)
Pre visit review using our clinic review tool, if applicable. No additional management support is needed unless otherwise documented below in the visit note. 

## 2015-04-20 ENCOUNTER — Ambulatory Visit: Payer: Self-pay | Admitting: Family Medicine

## 2015-04-20 ENCOUNTER — Encounter: Payer: Self-pay | Admitting: Family Medicine

## 2015-04-29 ENCOUNTER — Encounter: Payer: Self-pay | Admitting: Family Medicine

## 2015-04-29 ENCOUNTER — Ambulatory Visit (INDEPENDENT_AMBULATORY_CARE_PROVIDER_SITE_OTHER): Payer: BLUE CROSS/BLUE SHIELD | Admitting: Family Medicine

## 2015-04-29 VITALS — BP 126/80 | HR 80 | Ht 71.0 in | Wt 183.0 lb

## 2015-04-29 DIAGNOSIS — M5030 Other cervical disc degeneration, unspecified cervical region: Secondary | ICD-10-CM | POA: Diagnosis not present

## 2015-04-29 NOTE — Progress Notes (Signed)
[facility] [place], [place] [address] Phone: [phone] Subjective:     CC: Back pain follow-up [id number] Harry Nash is a 60 y.o. male coming in with complaint of  lower back pain and neck pain. Patient does have some arthritic changes Patient was doing conservative therapy and states that the prednisone did help. Patient has done. All other medications. Since he has stopped the medications pain is starting to come back. Feels that it is severe. Patient is having more headaches as well. Patient is wondering what else can be done. Patient states that this is starting affect daily activities. Denies any significant radiation into the arms but states that his arms me feel little more heavy. Rates the severity of pain though is still 7 out of 10.        neck x-rays show diffuse osteopenia degenerative changes at multiple levels of the cervical spine.  Past Medical History  Diagnosis Date  . Depression   . Headache(784.0)   . Kidney stones    No past surgical history on file. Social History   Social History  . Marital Status: Married    Spouse Name: N/A  . Number of Children: N/A  . Years of Education: N/A   Social History Main Topics  . Smoking status: Former Smoker    Quit date: 02/01/2003  . Smokeless tobacco: None  . Alcohol Use: 8.4 oz/week    14 Glasses of wine per week     Comment: moderate  . Drug Use: No  . Sexual Activity: Yes   Other Topics Concern  . None   Social History Narrative   No Known Allergies Family History  Problem Relation Age of Onset  . Heart disease Mother   . Heart disease Father   . Hypertension Father     Past medical history, social, surgical and family history all reviewed in electronic medical record.  No pertanent information unless stated regarding to the chief complaint.   Review of Systems: No headache, visual changes, nausea, vomiting, diarrhea, constipation, dizziness,  abdominal pain, skin rash, fevers, chills, night sweats, weight loss, swollen lymph nodes, body aches, joint swelling, muscle aches, chest pain, shortness of breath, mood changes.   Objective Blood pressure 126/80, pulse 80, height 5\' 11"  (1.803 m), weight 183 lb (83.008 kg), SpO2 95 %.  General: No apparent distress alert and oriented x3 mood and affect normal, dressed appropriately.  HEENT: Pupils equal, extraocular movements intact  Respiratory: Patient's speak in full sentences and does not appear short of breath  Cardiovascular: No lower extremity edema, non tender, no erythema  Skin: Warm dry intact with no signs of infection or rash on extremities or on axial skeleton.  Abdomen: Soft nontender  Neuro: Cranial nerves II through XII are intact, neurovascularly intact in all extremities with 2+ DTRs and 2+ pulses.  Lymph: No lymphadenopathy of posterior or anterior cervical chain or axillae bilaterally.  Gait mild antalgic gait  MSK:  Non tender with full range of motion and good stability and symmetric strength and tone of shoulders, elbows, wrist, hip, knees bilaterally.  Neck: Inspection unremarkable. No palpable stepoffs. Positive Spurling's with radicular symptoms going to the C8 distribution. This is as bad as it was previously.   guarding of multiple range of motion especially with side bending and rotation especially to the left. Seems to be lacking the last 10 of left-sided side bending and rotation. Grip strength and sensation normal in bilateral hands  Strength good C4 to T1 distribution No sensory change to C4 to T1 Negative Hoffman sign bilaterally Reflexes normal       Impression and Recommendations:     This case required medical decision making of moderate complexity.      Note: This dictation was prepared with Dragon dictation along with smaller phrase technology. Any transcriptional errors that result from this process are unintentional.

## 2015-04-29 NOTE — Patient Instructions (Signed)
Good to see you  Ice is your friend Physical therapy will be calling you  Gabapentin at night 100mg  only  If not where you want to be in 2 weeks send me a message and we will get the MRI.

## 2015-04-29 NOTE — Assessment & Plan Note (Signed)
Discussed with patient again at length patient was given multiple different choices. Patient elected to do conservative therapy and start with formal physical therapy. Encourage him to take gabapentin on a more regular basis. Patient has stopped all the other medications at this time. We discussed icing regimen. Patient will continue with the vitamin supplementations. I do not feel that manipulation would be warranted until we known more and advance imaging would be necessary. Prior to this. Patient having heaviness and continuing radicular symptoms patient does not respond I do think an MRI could be helpful. Patient come back and see me again in 4 weeks.  Spent  25 minutes with patient face-to-face and had greater than 50% of counseling including as described above in assessment and plan.

## 2015-04-29 NOTE — Progress Notes (Signed)
Pre visit review using our clinic review tool, if applicable. No additional management support is needed unless otherwise documented below in the visit note. 

## 2015-05-11 ENCOUNTER — Ambulatory Visit: Payer: BLUE CROSS/BLUE SHIELD

## 2015-05-25 ENCOUNTER — Ambulatory Visit: Payer: BLUE CROSS/BLUE SHIELD | Admitting: Physical Therapy

## 2015-05-29 ENCOUNTER — Ambulatory Visit: Payer: BLUE CROSS/BLUE SHIELD | Attending: Family Medicine

## 2015-05-29 DIAGNOSIS — M5412 Radiculopathy, cervical region: Secondary | ICD-10-CM | POA: Diagnosis not present

## 2015-05-29 DIAGNOSIS — M542 Cervicalgia: Secondary | ICD-10-CM | POA: Insufficient documentation

## 2015-05-29 DIAGNOSIS — R293 Abnormal posture: Secondary | ICD-10-CM | POA: Diagnosis not present

## 2015-05-29 NOTE — Therapy (Addendum)
[place], [place], [address] Phone: [phone]   Fax:  [phone]  Physical Therapy Evaluation/ Discharge  Patient Details  Name: Harry Nash MRN: [id number] Date of Birth: 06/05/1955 Referring Provider: Hulan Saas , DO  Encounter Date: 05/29/2015      PT End of Session - 05/29/15 1008    Visit Number 1   Number of Visits 12   Date for PT Re-Evaluation 07/10/15   Authorization Type BC[employer]   PT Start Time 0932   PT Stop Time 1020   PT Time Calculation (min) 48 min   Activity Tolerance Patient tolerated treatment well   Behavior During Therapy [facility] for tasks assessed/performed      Past Medical History  Diagnosis Date  . Depression   . Headache(784.0)   . Kidney stones     No past surgical history on file.  There were no vitals filed for this visit.       Subjective Assessment - 05/29/15 0939    Subjective He reports cervical pain severe 4-8 weeks ago. Pain less now and Dx with OA in neck and is worse with rainy weather.    Limitations --  everything when bad. Difficulty with work training dancers, sleep diffuiculty, carrying child.    How long can you sit comfortably? NA   How long can you stand comfortably? NA   How long can you walk comfortably? NA   Diagnostic tests Xray: OA  [place]   Patient Stated Goals To get some stengthening exercises,   Currently in Pain? Yes   Pain Score 4    Pain Location Neck   Pain Orientation Left;Lateral   Pain Descriptors / Indicators Aching;Tightness  annoying   Pain Type --  sub acute   Pain Radiating Towards occasion  to middle finger tingle/numb   Pain Onset More than a month ago   Pain Frequency Constant   Aggravating Factors  movement   Pain Relieving Factors Medication, ice when bad   Multiple Pain Sites No            [facility] PT Assessment - 05/29/15 0944    Assessment   Medical Diagnosis cervicalgia, DDD   Referring Provider  Hulan Saas , DO   Onset Date/Surgical Date --  2 months ago   Next MD Visit As needed   Prior Therapy Nothing he ght treatment for.    Precautions   Precautions None   Restrictions   Weight Bearing Restrictions No   Balance Screen   Has the patient fallen in the past 6 months No   Has the patient had a decrease in activity level because of a fear of falling?  No   Is the patient reluctant to leave their home because of a fear of falling?  No   Prior Function   Level of Independence Independent   Cognition   Overall Cognitive Status Within Functional Limits for tasks assessed   Observation/Other Assessments   Focus on Therapeutic Outcomes (FOTO)  35% limited   Coordination   Gross Motor Movements are Fluid and Coordinated Yes   Posture/Postural Control   Posture Comments LT shoulder higher than RT in sitting and scapula slightly elevated but corrected with cues for scapula depression with lower trap/    ROM / Strength   AROM / PROM / Strength AROM;Strength   AROM   Overall AROM Comments Shoulder motion normal   AROM Assessment Site Cervical   Cervical Flexion 58  Cervical Extension 70   Cervical - Right Side Bend 40   Cervical - Left Side Bend 20   Cervical - Right Rotation 60   Cervical - Left Rotation 50   Strength   Overall Strength Comments Normal Both UE and neck   Palpation   Palpation comment Soft tissue tenderness LT cervical paraspinals and upper traps   Ambulation/Gait   Gait Comments Normal.                            PT Education - 05/29/15 1007    Education provided Yes   Education Details POC, scapula depression , traction possibilities reactions, Did not need handout   Person(s) Educated Patient   Methods Explanation;Demonstration;Tactile cues;Verbal cues   Comprehension Returned demonstration;Verbalized understanding          PT Short Term Goals - 05/29/15 1013    PT SHORT TERM GOAL #1   Title He will be independent with  inital HEP   Time 3   Period Weeks   Status New   PT SHORT TERM GOAL #2   Title He will improve active rotation to 70 bilaterally   Time 3   Period Weeks   Status New   PT SHORT TERM GOAL #3   Title He will be able to demo understanding of good scapula position /posture   Time 3   Period Weeks   Status Revised           PT Long Term Goals - 05/29/15 1014    PT LONG TERM GOAL #1   Title He will be incependent wit all hEP issued   Time 6   Period Weeks   Status New   PT LONG TERM GOAL #2   Title He wil report pain as intermittant   Time 6   Period Weeks   Status New   PT LONG TERM GOAL #3   Title He will report 75% decreased discomfort with holding child   Time 6   Period Weeks   Status New   PT LONG TERM GOAL #4   Title He will report no episodes of sever pain in 2 weeks period   Time 6   Period Weeks   Status New   PT LONG TERM GOAL #5   Title He will report sleep comfort improved 75%   Time 6   Period Weeks   Status New               Plan - 05/29/15 1009    Clinical Impression Statement Harry Nash presesnts with LT sided neck pain  with occasional radiation to LT hand, stiffness of neck , and mild postural abnormality. He is having times of intense pain with milder pain. He should feel bette after PT   Rehab Potential Good   PT Frequency 2x / week   PT Duration 6 weeks   PT Treatment/Interventions Cryotherapy;Iontophoresis '4mg'$ /ml Dexamethasone;Moist Heat;Ultrasound;Traction;Therapeutic exercise;Taping;Manual techniques;Dry needling;Passive range of motion;Patient/family education   PT Next Visit Plan Traction if no negative impact from foirst session, STW, modalities,  scapularength with bands and prone pilates exercises if toleratedste   Consulted and Agree with Plan of Care Patient      Patient will benefit from skilled therapeutic intervention in order to improve the following deficits and impairments:  Pain, Postural dysfunction, Decreased strength,  Increased muscle spasms, Decreased range of motion  Visit Diagnosis: Cervicalgia  Abnormal posture  Radiculopathy, cervical region  Problem List Patient Active Problem List   Diagnosis Date Noted  . Plantar fasciitis of left foot 04/08/2015  . Back pain 04/08/2015  . Degenerative disc disease, cervical 04/08/2015  . Nonallopathic lesion of lumbosacral region 04/08/2015  . Nonallopathic lesion-rib cage 04/08/2015  . Nonallopathic lesion of cervical region 04/08/2015  . Tibialis posterior tendon tear, nontraumatic 02/18/2015  . Screening for HIV without presence of risk factors 06/11/2014  . Routine general medical examination at a health care facility 02/10/2012  . Depression with anxiety 11/11/2011  . Migraine headache 11/08/2010  . HYPERCHOLESTEROLEMIA 08/19/2009  . COLONIC POLYPS, ADENOMATOUS 08/12/2009    Darrel Hoover  PT 05/29/2015, 10:29 AM  [facility] [address] [place], [place], [address] Phone: [phone]   Fax:  [phone]  Name: Harry Nash MRN: [id number] Date of Birth: 05-29-1955   PHYSICAL THERAPY DISCHARGE SUMMARY  Visits from Start of Care: Eval only  Current functional level related to goals / functional outcomes: See above   Remaining deficits: Unknown as he did not return   Education / Equipment: HEP Plan:                                                    Patient goals were not met. Patient is being discharged due to not returning since the last visit.  ?????    Darrel Hoover, PT  09/14/15           11:16 AM

## 2015-06-05 ENCOUNTER — Ambulatory Visit: Payer: BLUE CROSS/BLUE SHIELD

## 2015-06-09 ENCOUNTER — Encounter: Payer: Self-pay | Admitting: Physical Therapy

## 2015-06-16 ENCOUNTER — Encounter: Payer: Self-pay | Admitting: Physical Therapy

## 2015-06-23 ENCOUNTER — Encounter: Payer: Self-pay | Admitting: Physical Therapy

## 2015-07-13 ENCOUNTER — Ambulatory Visit (INDEPENDENT_AMBULATORY_CARE_PROVIDER_SITE_OTHER)
Admission: RE | Admit: 2015-07-13 | Discharge: 2015-07-13 | Disposition: A | Discharge status: Home / Self Care | Payer: BLUE CROSS/BLUE SHIELD | Source: Ambulatory Visit | Attending: Family Medicine | Admitting: Family Medicine

## 2015-07-13 ENCOUNTER — Encounter: Payer: Self-pay | Admitting: Family Medicine

## 2015-07-13 ENCOUNTER — Ambulatory Visit (INDEPENDENT_AMBULATORY_CARE_PROVIDER_SITE_OTHER): Payer: BLUE CROSS/BLUE SHIELD | Admitting: Family Medicine

## 2015-07-13 VITALS — BP 122/74 | HR 76 | Ht 71.0 in | Wt 187.0 lb

## 2015-07-13 DIAGNOSIS — M19072 Primary osteoarthritis, left ankle and foot: Secondary | ICD-10-CM | POA: Diagnosis not present

## 2015-07-13 DIAGNOSIS — M722 Plantar fascial fibromatosis: Secondary | ICD-10-CM

## 2015-07-13 DIAGNOSIS — R51 Headache: Secondary | ICD-10-CM

## 2015-07-13 DIAGNOSIS — G43009 Migraine without aura, not intractable, without status migrainosus: Secondary | ICD-10-CM

## 2015-07-13 DIAGNOSIS — M79672 Pain in left foot: Secondary | ICD-10-CM

## 2015-07-13 DIAGNOSIS — M25572 Pain in left ankle and joints of left foot: Secondary | ICD-10-CM

## 2015-07-13 DIAGNOSIS — M25579 Pain in unspecified ankle and joints of unspecified foot: Secondary | ICD-10-CM | POA: Insufficient documentation

## 2015-07-13 DIAGNOSIS — R519 Headache, unspecified: Secondary | ICD-10-CM

## 2015-07-13 MED ORDER — GABAPENTIN 100 MG PO CAPS: 200.0000 mg | ORAL_CAPSULE | Freq: Every day | ORAL | Status: DC

## 2015-07-13 NOTE — Assessment & Plan Note (Signed)
Patient will be sent to neurology for further evaluation. We discussed with patient about possible workup including an MRI that he would like referral to neurology first. We discussed if worsening symptoms he needs to seek medical attention immediately. No associated with nausea, no radiation down the arm. Patient's risk factors  for CVA and is only hypercholesterolemia. I do believe the patient's anxiety is also contributing. Discussed action plan with patient.

## 2015-07-13 NOTE — Progress Notes (Signed)
[name] [place], [place] [address] Phone: [phone] Subjective:     CC: Left foot pain   [id number] Harry Nash is a 60 y.o. male coming in with complaint of left foot pain. Patient States that it has been going on for the last month. Notices more on the medial aspect of the foot. Seems to be worse though with standing. Rates the severity of pain is 8 out of 10. Does give history of an os trigonum status post surgical resection multiple years ago. States that there is some associated numbness of the toes. Feels to be worsening over the course of time. 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  Patient is also complaining headaches. Patient states that he has had this for quite some time but now the migraine seem to be worsening. Patient also states that he has had almost a shocking sensation during sexual intercourse seems to be located on the left side of his head. Denies any weakness, no visual changes but possibly has some ringing in his ear.    Past Medical History  Diagnosis Date  . Depression   . Headache(784.0)   . Kidney stones    No past surgical history on file. Social History   Social History  . Marital Status: Married    Spouse Name: N/A  . Number of Children: N/A  . Years of Education: N/A   Social History Main Topics  . Smoking status: Former Smoker    Quit date: 02/01/2003  . Smokeless tobacco: Not on file  . Alcohol Use: 8.4 oz/week    14 Glasses of wine per week     Comment: moderate  . Drug Use: No  . Sexual Activity: Yes   Other Topics Concern  . Not on file   Social History Narrative   No Known Allergies Family History  Problem Relation Age of Onset  . Heart disease Mother   . Heart disease Father   . Hypertension Father     Past medical history, social, surgical and family history all reviewed in electronic  medical record.  No pertanent information unless stated regarding to the chief complaint.   Review of Systems: No headache, visual changes, nausea, vomiting, diarrhea, constipation, dizziness, abdominal pain, skin rash, fevers, chills, night sweats, weight loss, swollen lymph nodes, body aches, joint swelling, muscle aches, chest pain, shortness of breath, mood changes.   Objective There were no vitals taken for this visit.  General: No apparent distress alert and oriented x3 mood and affect normal, dressed appropriately.  HEENT: Pupils equal, extraocular movements intact  Respiratory: Patient's speak in full sentences and does not appear short of breath  Cardiovascular: No lower extremity edema, non tender, no erythema  Skin: Warm dry intact with no signs of infection or rash on extremities or on axial skeleton.  Abdomen: Soft nontender  Neuro: Cranial nerves II through XII are intact, neurovascularly intact in all extremities with 2+ DTRs and 2+ pulses.  Lymph: No lymphadenopathy of posterior or anterior cervical chain or axillae bilaterally.  Gait normal with good balance and coordination.  MSK:  Non tender with full range of motion and good stability and symmetric strength and tone of shoulders, elbows, wrist, hip, knee and bilaterally.  Ankle: Left Mild swelling at the origin of the plantaris on the medial calcaneal region she is severely tender in this  area Range of motion is full in all directions. Strength is 5/5 in all directions. Stable lateral and medial ligaments; squeeze test and kleiger test unremarkable; Talar dome minimal tender No pain at base of 5th MT; No tenderness over cuboid; No tenderness over N spot or navicular prominence No tenderness on posterior aspects of lateral and medial malleolus No sign of peroneal tendon subluxations or tenderness to palpation Negative tarsal tunnel tinel's Able to walk 4 steps.  MSK US performed of: Left ankle and foot This study was  ordered, performed, and interpreted by Charlann Boxer D.O.  Foot/Ankle:   All structures visualized.   Talar dome unremarkable mild arthritic changes noted Ankle mortise without effusion. Minimal arthritic changes noted Peroneus longus and brevis tendons unremarkable on long and transverse views without sheath effusions. Posterior tibialis, flexor hallucis longus, and flexor digitorum longus tendons unremarkable on long and transverse views without sheath effusions. Achilles tendon visualized along length of tendon and unremarkable on long and transverse views without sheath effusion. Anterior Talofibular Ligament and Calcaneofibular Ligaments unremarkable and intact. Deltoid Ligament unremarkable and intact. Plantar fascia mild enlargement of 1.2 cm but very minimal hypoechoic changes patient does have a very large varicose vein on the area where patient is most tender. Power doppler signal normal. Picture saved in internal hard drive IMPRESSION:  Varicose vein on the plantar aspect of the first     Impression and Recommendations:     This case required medical decision making of moderate complexity.      Note: This dictation was prepared with Dragon dictation along with smaller phrase technology. Any transcriptional errors that result from this process are unintentional.

## 2015-07-13 NOTE — Progress Notes (Signed)
Pre visit review using our clinic review tool, if applicable. No additional management support is needed unless otherwise documented below in the visit note. 

## 2015-07-13 NOTE — Assessment & Plan Note (Signed)
Patient may have some mild plantar fasciitis of the left foot but don't think that this is concerning to it. Patient does have a varicose vein on the plantar aspect of the foot that I think is more likely giving him difficulty. Started on gabapentin again. Differential does include a tarsal tunnel syndrome and x-rays are pending. We will see if there has been any reaccumulation of the os trigonum. We discussed proper shoes and patient will try to do this on a more regular basis. Patient will come back. If continuing have pain we'll consider an injection in the plantar fascia as well as potentially the tarsal tunnel area to help with diagnostic as well as hopefully therapeutic.

## 2015-07-13 NOTE — Patient Instructions (Addendum)
Good to see you  Get xrays downstairs and we will make sure os trigone is not back . Restart the gabapentin 100mg  at night  I think it is a varicose vein.  I would keep wearing good shoesand look at orthotics at the running stores. If not better in 2 weeks see me again and we will tyr a couple of injections in Plantar fascia and tarsal tunnel to see what is going on.  Otherwise we will need mri of the foot.

## 2015-07-20 ENCOUNTER — Other Ambulatory Visit: Payer: Self-pay

## 2015-07-20 ENCOUNTER — Ambulatory Visit (INDEPENDENT_AMBULATORY_CARE_PROVIDER_SITE_OTHER): Payer: BLUE CROSS/BLUE SHIELD | Admitting: Family Medicine

## 2015-07-20 VITALS — BP 126/82 | HR 69 | Ht 71.0 in | Wt 187.0 lb

## 2015-07-20 DIAGNOSIS — M25572 Pain in left ankle and joints of left foot: Secondary | ICD-10-CM

## 2015-07-20 NOTE — Patient Instructions (Signed)
Good to see you  Ice is your friend Stay active but take today easy.  It may take a coup;le days to work.  See me again in 2 weeks to make sure we are making improvement.  If not we may need to consider MRI to further evaluate area.

## 2015-07-20 NOTE — Progress Notes (Signed)
[name] [place], [place] [address] Phone: [phone] Subjective:     CC: Left foot pain f/u  [id number] Harry Nash is a 60 y.o. male coming in with complaint of left foot pain. Patient States that it has been going on for the last month.  Patient did see me and was given different treatment options last week. Unfortunate pain seemed to be worsening. X-rays were ordered. X-rays were independently visualized by me. X-ray show moderate osteophytic changes of the left first MTP joint. In area where patient is tender possible bone island and degenerative spurring of the calcaneus noted.  patient states that the pain is worsening where he cannot walk on it regularly. 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   Patient is also complaining headaches. States that they're improving overall. Concerned though because sometimes he can be a severe pain for sharp second and then seems to go away. Following up with primary care provider.     Past Medical History  Diagnosis Date  . Depression   . Headache(784.0)   . Kidney stones    No past surgical history on file. Social History   Social History  . Marital Status: Married    Spouse Name: N/A  . Number of Children: N/A  . Years of Education: N/A   Social History Main Topics  . Smoking status: Former Smoker    Quit date: 02/01/2003  . Smokeless tobacco: Not on file  . Alcohol Use: 8.4 oz/week    14 Glasses of wine per week     Comment: moderate  . Drug Use: No  . Sexual Activity: Yes   Other Topics Concern  . Not on file   Social History Narrative   No Known Allergies Family History  Problem Relation Age of Onset  . Heart disease Mother   . Heart disease Father   . Hypertension Father     Past medical history, social, surgical and family history all reviewed in electronic medical record.  No pertanent information unless stated  regarding to the chief complaint.   Review of Systems: No headache, visual changes, nausea, vomiting, diarrhea, constipation, dizziness, abdominal pain, skin rash, fevers, chills, night sweats, weight loss, swollen lymph nodes, body aches, joint swelling, muscle aches, chest pain, shortness of breath, mood changes.   Objective Blood pressure 126/82, pulse 69, height 5\' 11"  (1.803 m), weight 187 lb (84.823 kg), SpO2 97 %.  General: No apparent distress alert and oriented x3 mood and affect normal, dressed appropriately.  HEENT: Pupils equal, extraocular movements intact  Respiratory: Patient's speak in full sentences and does not appear short of breath  Cardiovascular: No lower extremity edema, non tender, no erythema  Skin: Warm dry intact with no signs of infection or rash on extremities or on axial skeleton.  Abdomen: Soft nontender  Neuro: Cranial nerves II through XII are intact, neurovascularly intact in all extremities with 2+ DTRs and 2+ pulses.  Lymph: No lymphadenopathy of posterior or anterior cervical chain or axillae bilaterally.  Gait normal with good balance and coordination.  MSK:  Non tender with full range of motion and good stability and symmetric strength and tone of shoulders, elbows, wrist, hip, knee and bilaterally.  Ankle: Left Mild swelling at the origin of the plantaris on the medial calcaneal region she is severely tender in this area Range of motion is full in all directions. Strength is 5/5  in all directions. Stable lateral and medial ligaments; squeeze test and kleiger test unremarkable; Talar dome minimal tender No pain at base of 5th MT; No tenderness over cuboid; No tenderness over N spot or navicular prominence No tenderness on posterior aspects of lateral and medial malleolus No sign of peroneal tendon subluxations or tenderness to palpation Negative tarsal tunnel tinel's Able to walk 4 steps.  Procedure: Real-time Ultrasound Guided Injection of  left  bony island at the insertion of the plantaris tendon Device: GE Logiq E  Ultrasound guided injection is preferred based studies that show increased duration, increased effect, greater accuracy, decreased procedural pain, increased response rate, and decreased cost with ultrasound guided versus blind injection.  Verbal informed consent obtained.  Time-out conducted.  Noted no overlying erythema, induration, or other signs of local infection.  Skin prepped in a sterile fashion.  Local anesthesia: Topical Ethyl chloride.  With sterile technique and under real time ultrasound guidance:  With a 21-gauge 2 inch needle patient was injected with a total of 0.5 mL of 0.5% Marcaine and 0.5 mL of Kenalog 40 mg/dL. Calcific deposit noted was broken up to a certain extent. Also some of the material didn't go within the plantaris tendon sheath. Completed without difficulty  Pain immediately resolved suggesting accurate placement of the medication.  Advised to call if fevers/chills, erythema, induration, drainage, or persistent bleeding.  Images permanently stored and available for review in the ultrasound unit.  Impression: Technically successful ultrasound guided injection.     Impression and Recommendations:     This case required medical decision making of moderate complexity.      Note: This dictation was prepared with Dragon dictation along with smaller phrase technology. Any transcriptional errors that result from this process are unintentional.

## 2015-07-20 NOTE — Progress Notes (Signed)
Pre visit review using our clinic review tool, if applicable. No additional management support is needed unless otherwise documented below in the visit note. 

## 2015-07-20 NOTE — Assessment & Plan Note (Signed)
Patient given injection today in the area that seems to be giving him the most pain. Patient did have a very small what appeared to be a bone island. Patient has a history of os trigonum previously but I do not see any reaccumulation at this point and patient has had surgical intervention. Patient will continue to wear the orthotics. We discussed icing regimen. Open that this will be beneficial. Patient will continue with the once weekly vitamin D to help with reabsorption of calcium. If patient has no significant improvement in the next 7 days I would consider advanced imaging with this being the working diagnosis but not asked common. MRI couldn't further evaluate for other pathology that we cannot see on x-ray or ultrasound. Patient is having enough pain that is changing his gait.  Spent  25 minutes with patient face-to-face and had greater than 50% of counseling including as described above in assessment and plan.

## 2015-07-23 ENCOUNTER — Ambulatory Visit (INDEPENDENT_AMBULATORY_CARE_PROVIDER_SITE_OTHER): Payer: BLUE CROSS/BLUE SHIELD | Admitting: Family Medicine

## 2015-07-23 ENCOUNTER — Other Ambulatory Visit: Payer: Self-pay | Admitting: Family Medicine

## 2015-07-23 ENCOUNTER — Encounter: Payer: Self-pay | Admitting: Family Medicine

## 2015-07-23 VITALS — BP 134/83 | HR 74 | Temp 97.7°F | Ht 71.0 in | Wt 187.4 lb

## 2015-07-23 DIAGNOSIS — E7800 Pure hypercholesterolemia, unspecified: Secondary | ICD-10-CM

## 2015-07-23 DIAGNOSIS — R531 Weakness: Secondary | ICD-10-CM | POA: Insufficient documentation

## 2015-07-23 DIAGNOSIS — Z1159 Encounter for screening for other viral diseases: Secondary | ICD-10-CM | POA: Diagnosis not present

## 2015-07-23 DIAGNOSIS — G43009 Migraine without aura, not intractable, without status migrainosus: Secondary | ICD-10-CM

## 2015-07-23 DIAGNOSIS — N529 Male erectile dysfunction, unspecified: Secondary | ICD-10-CM | POA: Diagnosis not present

## 2015-07-23 DIAGNOSIS — M722 Plantar fascial fibromatosis: Secondary | ICD-10-CM | POA: Diagnosis not present

## 2015-07-23 DIAGNOSIS — F4323 Adjustment disorder with mixed anxiety and depressed mood: Secondary | ICD-10-CM

## 2015-07-23 DIAGNOSIS — Z114 Encounter for screening for human immunodeficiency virus [HIV]: Secondary | ICD-10-CM

## 2015-07-23 LAB — COMPLETE METABOLIC PANEL WITH GFR
ALT: 30 U/L (ref 9–46)
AST: 26 U/L (ref 10–35)
Albumin: 4.4 g/dL (ref 3.6–5.1)
Alkaline Phosphatase: 63 U/L (ref 40–115)
BUN: 17 mg/dL (ref 7–25)
CHLORIDE: 101 mmol/L (ref 98–110)
CO2: 28 mmol/L (ref 20–31)
Calcium: 9.5 mg/dL (ref 8.6–10.3)
Creat: 0.93 mg/dL (ref 0.70–1.33)
GFR, Est African American: >89 mL/min (ref >=60)
GFR, Est Non African American: >89 mL/min (ref >=60)
GLUCOSE: 91 mg/dL (ref 65–99)
POTASSIUM: 4.6 mmol/L (ref 3.5–5.3)
SODIUM: 139 mmol/L (ref 135–146)
Total Bilirubin: 0.6 mg/dL (ref 0.2–1.2)
Total Protein: 6.8 g/dL (ref 6.1–8.1)

## 2015-07-23 LAB — CBC
HCT: 45.1 % (ref 38.5–50.0)
Hemoglobin: 15.4 g/dL (ref 13.2–17.1)
MCH: 29.1 pg (ref 27.0–33.0)
MCHC: 34.1 g/dL (ref 32.0–36.0)
MCV: 85.3 fL (ref 80.0–100.0)
MPV: 9.9 fL (ref 7.5–12.5)
PLATELETS: 246 10*3/uL (ref 140–400)
RBC: 5.29 MIL/uL (ref 4.20–5.80)
RDW: 13.3 % (ref 11.0–15.0)
WBC: 8.4 10*3/uL (ref 3.8–10.8)

## 2015-07-23 LAB — LIPID PANEL
Cholesterol: 206 mg/dL — ABNORMAL HIGH (ref 125–200)
HDL: 61 mg/dL (ref >=40)
LDL CALC: 122 mg/dL (ref <130)
TRIGLYCERIDES: 114 mg/dL (ref <150)
Total CHOL/HDL Ratio: 3.4 Ratio (ref <=5.0)
VLDL: 23 mg/dL (ref <30)

## 2015-07-23 LAB — TSH: TSH: 1.05 m[IU]/L (ref 0.40–4.50)

## 2015-07-23 LAB — POCT SEDIMENTATION RATE: POCT SED RATE: 0 mm/h (ref 0–22)

## 2015-07-23 NOTE — Patient Instructions (Signed)
I will call with test results tomorrow. We can make definitive plans at that time.  Likely I will want you to digest some information and get back to see me in a couple of weeks. Google cluster headaches - which is a migraine variant.  I will also check you for temporal arteritis.   I am going to check your testosterone level.

## 2015-07-23 NOTE — Assessment & Plan Note (Signed)
Gabapentin even in modest dose caused clouded mentation.

## 2015-07-24 LAB — HEPATITIS C ANTIBODY: HCV AB: NEGATIVE

## 2015-07-24 MED ORDER — ASPIRIN 81 MG PO TABS: 81.0000 mg | ORAL_TABLET | Freq: Every day | ORAL | Status: DC

## 2015-07-24 NOTE — Assessment & Plan Note (Signed)
Screen x 1 

## 2015-07-24 NOTE — Assessment & Plan Note (Signed)
Check testosterone.  Likely trial of viagra.

## 2015-07-24 NOTE — Progress Notes (Signed)
   Subjective:    Patient ID: Harry Nash, male    DOB: 01-21-1956, 60 y.o.   MRN: [id number]  HPI  Really not an annual wellness exam.  More of follow up and multiple, perhaps related problems. 1. Concern for generalized weakness and lack of energy.  Slowly progressive over months.  He has no focal symptoms.  Denies DOE (although his stamina is not what it once was), indigestion, change in bowel or bladder, chest pain.  May be related to numbers 2 and 3 and 4.  Never screened for hep C or HIV.  Only averaging 4 -6 hours of sleep per night. 2. Left sided headaches - mostly left temple.  No jaw claudication.  Headaches last seconds to minutes and are severe.  No tearing or rhinorhea.  Does have migraine headaches - these are not similar.  Never had before.  No focal motor or sensory complaints. 3. Hx of depression.  States it is in good control.  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  Supported by [employer] income.  No SI or HI.  Lack of sleep is mostly kids.  See also #5. 4. New and in many ways challenging social situation.  He is an older father (68) with two young children (1&5?).  Now primary caregiver for the summer.  Life is relentless.  He and wife have good relationship.  Not many activities outside of nuclear family.  Although he has been very active historically, he is not exercising much due to his caregiver responsibilities. 5. Decreased libido and mild erectile dysfunction.      Review of Systems     Objective:   Physical Exam  HEENT normal No tenderness over left masseter muscle or temporal artery Neck supple Lungs clear Cardiac RRR without m or g Abd benign. Ext WNL  Neuro, WNL       Assessment & Plan:

## 2015-07-24 NOTE — Assessment & Plan Note (Signed)
Broad differential.  Check labs.  Likely adjustment reaction.

## 2015-07-24 NOTE — Addendum Note (Signed)
Addended by: [name] on: 07/24/2015 11:10 AM   Modules accepted: Orders

## 2015-07-24 NOTE — Assessment & Plan Note (Signed)
Likely an adjustment reaction.  Assess labs, reassurance and encourage exercise.

## 2015-07-24 NOTE — Assessment & Plan Note (Signed)
10 year CAD risk is 7.6 % - right on the cusp of statin therapy.  Add ASA, increase exercise and recheck next year.

## 2015-07-24 NOTE — Assessment & Plan Note (Signed)
I believe his current headaches best fit with cluster headaches.  They are manageable and decreasing.  Observe for now.

## 2015-07-27 ENCOUNTER — Encounter: Payer: Self-pay | Admitting: Family Medicine

## 2015-07-28 ENCOUNTER — Ambulatory Visit: Payer: BLUE CROSS/BLUE SHIELD | Admitting: Neurology

## 2015-07-28 LAB — TESTOS,TOTAL,FREE AND SHBG (FEMALE)
SEX HORMONE BINDING GLOB.: 45 nmol/L (ref 22–77)
Testosterone, Free: 61.8 pg/mL (ref 35.0–155.0)
Testosterone,Total,LC/MS/MS: 583 ng/dL (ref 250–1100)

## 2015-08-07 ENCOUNTER — Other Ambulatory Visit: Payer: Self-pay | Admitting: Family Medicine

## 2015-08-13 ENCOUNTER — Ambulatory Visit (INDEPENDENT_AMBULATORY_CARE_PROVIDER_SITE_OTHER): Payer: BLUE CROSS/BLUE SHIELD | Admitting: Family Medicine

## 2015-08-13 ENCOUNTER — Encounter: Payer: Self-pay | Admitting: Family Medicine

## 2015-08-13 ENCOUNTER — Other Ambulatory Visit: Payer: Self-pay

## 2015-08-13 VITALS — BP 128/78 | HR 76 | Ht 71.0 in | Wt 190.0 lb

## 2015-08-13 DIAGNOSIS — M7552 Bursitis of left shoulder: Secondary | ICD-10-CM | POA: Diagnosis not present

## 2015-08-13 DIAGNOSIS — M25512 Pain in left shoulder: Secondary | ICD-10-CM

## 2015-08-13 NOTE — Patient Instructions (Addendum)
Good to see you Ice is your friend  Try to keep hands within peripheral vision For the foot.  Watch and send a message in 2 weeks.

## 2015-08-13 NOTE — Progress Notes (Signed)
[facility] [address] [place], [place] [address] Phone: [phone] Subjective:      CC: Left shoulder pain  [id number] [name] is a 60 y.o. male coming in with complaint of shoulder pain. Patient does have a past medical history significant for diffuse to tender changes of the neck. Patient states he is picking up his child above his head. Patient states that he tried to prone. Patient did not have pain immediately but that night significant pain. Patient states Was unable to move them. Patient states after that there is making some improvement but when he makes sure he is okay to continue his lifting regimen.     Past Medical History  Diagnosis Date  . Depression   . Headache(784.0)   . Kidney stones    No past surgical history on file. Social History   Social History  . Marital Status: Married    Spouse Name: N/A  . Number of Children: N/A  . Years of Education: N/A   Social History Main Topics  . Smoking status: Former Smoker    Quit date: 02/01/2003  . Smokeless tobacco: None  . Alcohol Use: 8.4 oz/week    14 Glasses of wine per week     Comment: moderate  . Drug Use: No  . Sexual Activity: Yes   Other Topics Concern  . None   Social History Narrative   Allergies  Allergen Reactions  . Gabapentin Other (See Comments)    Clouded mentation   Family History  Problem Relation Age of Onset  . Heart disease Mother   . Heart disease Father   . Hypertension Father     Past medical history, social, surgical and family history all reviewed in electronic medical record.  No pertanent information unless stated regarding to the chief complaint.   Review of Systems: No headache, visual changes, nausea, vomiting, diarrhea, constipation, dizziness, abdominal pain, skin rash, fevers, chills, night sweats, weight loss, swollen lymph nodes, body aches, joint swelling, muscle aches, chest pain, shortness of breath, mood  changes.   Objective Blood pressure 128/78, pulse 76, height 5\' 11"  (1.803 m), weight 190 lb (86.183 kg), SpO2 95 %.  General: No apparent distress alert and oriented x3 mood and affect normal, dressed appropriately.  HEENT: Pupils equal, extraocular movements intact  Respiratory: Patient's speak in full sentences and does not appear short of breath  Cardiovascular: No lower extremity edema, non tender, no erythema  Skin: Warm dry intact with no signs of infection or rash on extremities or on axial skeleton.  Abdomen: Soft nontender  Neuro: Cranial nerves II through XII are intact, neurovascularly intact in all extremities with 2+ DTRs and 2+ pulses.  Lymph: No lymphadenopathy of posterior or anterior cervical chain or axillae bilaterally.  Gait normal with good balance and coordination.  MSK:  Non tender with full range of motion and good stability and symmetric strength and tone of  elbows, wrist, hip, knee and ankles bilaterally.  Neck: Inspection unremarkable. No palpable stepoffs. Negative Spurling's maneuver. Full neck range of motion Grip strength and sensation normal in bilateral hands Strength good C4 to T1 distribution No sensory change to C4 to T1 Negative Hoffman sign bilaterally Reflexes normal  Shoulder: Left Inspection reveals no abnormalities, atrophy or asymmetry. Palpation is normal with no tenderness over AC joint or bicipital groove. ROM is full in all planes passively. Rotator cuff strength normal throughout. signs of impingement with positive Neer and Hawkin's tests, but  negative empty can sign. Speeds and Yergason's tests normal. No labral pathology noted with negative Obrien's, negative clunk and good stability. Normal scapular function observed. No painful arc and no drop arm sign. No apprehension sign  MSK US performed of: Right This study was ordered, performed, and interpreted by Charlann Boxer D.O.  Shoulder:   Supraspinatus:  Appears normal on long  and transverse views, Bursal bulge seen with shoulder abduction on impingement view. Infraspinatus:  Appears normal on long and transverse views. Significant increase in Doppler flow Subscapularis:  Appears normal on long and transverse views. Positive bursa Teres Minor:  Appears normal on long and transverse views. AC joint:  Capsule undistended, no geyser sign. Glenohumeral Joint:  Appears normal without effusion. Glenoid Labrum:  Intact without visualized tears. Biceps Tendon:  Appears normal on long and transverse views, no fraying of tendon, tendon located in intertubercular groove, no subluxation with shoulder internal or external rotation.  Impression: Subacromial bursitis, very mild      Impression and Recommendations:     This case required medical decision making of moderate complexity.      Note: This dictation was prepared with Dragon dictation along with smaller phrase technology. Any transcriptional errors that result from this process are unintentional.

## 2015-08-13 NOTE — Progress Notes (Signed)
Pre visit review using our clinic review tool, if applicable. No additional management support is needed unless otherwise documented below in the visit note. 

## 2015-08-13 NOTE — Assessment & Plan Note (Signed)
Patient states that the left shoulder bursitis seems to be improving on its own. We discussed icing, home exercises. We discussed which activities up eventually avoid an proper lifting mechanics. His lungs patient does well he can follow-up on an as-needed basis.

## 2015-09-08 ENCOUNTER — Other Ambulatory Visit: Payer: Self-pay | Admitting: Family Medicine

## 2015-09-17 DIAGNOSIS — L820 Inflamed seborrheic keratosis: Secondary | ICD-10-CM | POA: Diagnosis not present

## 2015-09-17 DIAGNOSIS — Z85828 Personal history of other malignant neoplasm of skin: Secondary | ICD-10-CM | POA: Diagnosis not present

## 2015-09-17 DIAGNOSIS — D225 Melanocytic nevi of trunk: Secondary | ICD-10-CM | POA: Diagnosis not present

## 2015-09-17 DIAGNOSIS — L814 Other melanin hyperpigmentation: Secondary | ICD-10-CM | POA: Diagnosis not present

## 2015-09-17 DIAGNOSIS — L821 Other seborrheic keratosis: Secondary | ICD-10-CM | POA: Diagnosis not present

## 2015-10-26 ENCOUNTER — Encounter: Payer: Self-pay | Admitting: Family Medicine

## 2015-10-26 MED ORDER — SILDENAFIL CITRATE 100 MG PO TABS: 50.0000 mg | ORAL_TABLET | Freq: Every day | ORAL | 0 refills | Status: DC | PRN

## 2015-11-01 NOTE — Progress Notes (Signed)
[facility], [facility] Nash Phone: [phone] Subjective:    CC: Left foot pain f/u   [id number]  Harry Nash is a 60 y.o. male coming in with complaint of left foot pain. Patient States that it has been going on for the last month.  Patient did see me and was given different treatment options last week. Unfortunate pain seemed to be worsening. X-rays were ordered. X-rays were independently visualized by me. X-ray show moderate osteophytic changes of the left first MTP joint. As well as chronic-appearing posterior lytic changes of the medial left ankle. Patient was diagnosed with more of a tarsal tunnel syndrome and was given an injection. Patient did tolerate the procedure previously and did have a proximally 6 weeks of relief and unfortunately the pain is coming back.     Past Medical History:  Diagnosis Date  . Depression   . Headache(784.0)   . Kidney stones    No past surgical history on file. Social History   Social History  . Marital status: Married    Spouse name: N/A  . Number of children: N/A  . Years of education: N/A   Social History Main Topics  . Smoking status: Former Smoker    Quit date: 02/01/2003  . Smokeless tobacco: None  . Alcohol use 8.4 oz/week    14 Glasses of wine per week     Comment: moderate  . Drug use: No  . Sexual activity: Yes   Other Topics Concern  . None   Social History Narrative  . None   Allergies  Allergen Reactions  . Gabapentin Other (See Comments)    Clouded mentation   Family History  Problem Relation Age of Onset  . Heart disease Mother   . Heart disease Father   . Hypertension Father     Past medical history, social, surgical and family history all reviewed in electronic medical record.  No pertanent information unless stated regarding to the chief complaint.   Review of Systems: No headache, visual changes, nausea, vomiting, diarrhea, constipation,  dizziness, abdominal pain, skin rash, fevers, chills, night sweats, weight loss, swollen lymph nodes, chest pain, shortness of breath, mood changes.   Objective  Blood pressure 128/82, pulse 73, weight 186 lb (84.4 kg), SpO2 97 %.  General: No apparent distress alert and oriented x3 mood and affect normal, dressed appropriately.  HEENT: Pupils equal, extraocular movements intact  Respiratory: Patient's speak in full sentences and does not appear short of breath  Cardiovascular: No lower extremity edema, non tender, no erythema  Skin: Warm dry intact with no signs of infection or rash on extremities or on axial skeleton.  Abdomen: Soft nontender  Neuro: Cranial nerves II through XII are intact, neurovascularly intact in all extremities with 2+ DTRs and 2+ pulses.  Lymph: No lymphadenopathy of posterior or anterior cervical chain or axillae bilaterally.  Gait normal with good balance and coordination.  MSK:  Non tender with full range of motion and good stability and symmetric strength and tone of shoulders, elbows, wrist, hip, knee and bilaterally.  Ankle: Left No swelling noted today and this has full range of motion of the left ankle. Strength is 5/5 in all directions. Stable lateral and medial ligaments; squeeze test and kleiger test unremarkable; Talar dome moderate tenderness on the medial joint line No pain at base of 5th MT; No tenderness over cuboid; No tenderness over N spot or navicular prominence No tenderness  on posterior aspects of lateral and medial malleolus No sign of peroneal tendon subluxations or tenderness to palpation Positive tarsal tunnel Tinel's sign as well as with compression. Able to walk 4 steps.     Impression and Recommendations:     This case required medical decision making of moderate complexity.      Note: This dictation was prepared with Dragon dictation along with smaller phrase technology. Any transcriptional errors that result from this  process are unintentional.

## 2015-11-02 ENCOUNTER — Ambulatory Visit (INDEPENDENT_AMBULATORY_CARE_PROVIDER_SITE_OTHER): Payer: BLUE CROSS/BLUE SHIELD | Admitting: Family Medicine

## 2015-11-02 ENCOUNTER — Other Ambulatory Visit: Payer: Self-pay | Admitting: Family Medicine

## 2015-11-02 ENCOUNTER — Encounter: Payer: Self-pay | Admitting: Family Medicine

## 2015-11-02 VITALS — BP 128/82 | HR 73 | Wt 186.0 lb

## 2015-11-02 DIAGNOSIS — G5752 Tarsal tunnel syndrome, left lower limb: Secondary | ICD-10-CM | POA: Insufficient documentation

## 2015-11-02 DIAGNOSIS — Z77018 Contact with and (suspected) exposure to other hazardous metals: Secondary | ICD-10-CM

## 2015-11-02 NOTE — Assessment & Plan Note (Signed)
Discussed with patient again at great length. 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 Patient did respond fairly well to an injection for the tarsal tunnel area previously. Patient is also had a plantar fasciitis as well as a posterior tibialis tendon tear. Has had significant difficulty with this ankle. Discussed with him about repeating the injection or further imaging for further evaluation patient clearly he would rather go with the imaging. Patient will be scheduled for MRI. This will further evaluate patient's ankle for tarsal tunnel syndrome as well as we will get an MRI of the foot to further evaluate and we don't miss a tear of the posterior tibialis tendon on the navicular bone. Patient is failed all other conservative therapies including formal physical therapy at this time and if tarsal tunnel syndrome is diagnosed consider referral to orthopedic surgery for further evaluation and treatment.  Spent  25 minutes with patient face-to-face and had greater than 50% of counseling including as described above in assessment and plan as well as discussed over-the-counter with surgical intervention and likely tear. There afterwards.

## 2015-11-02 NOTE — Patient Instructions (Addendum)
God to see you  I am sorry you are still hurting.  We will get MRI to rule out tarsal tunnel syndrome and see what we can do.  Dr. Clyde Canterbury office will call you as well.  I would remain active If worsening pain then would do tramadol at night Earth fare, whole foods, [employer] or brands puritan pure, Now or Wow can be good.  See me again if you need or send me message if you need me.

## 2015-11-13 ENCOUNTER — Ambulatory Visit
Admission: RE | Admit: 2015-11-13 | Discharge: 2015-11-13 | Disposition: A | Discharge status: Home / Self Care | Payer: BLUE CROSS/BLUE SHIELD | Source: Ambulatory Visit | Attending: Family Medicine | Admitting: Family Medicine

## 2015-11-13 DIAGNOSIS — G5752 Tarsal tunnel syndrome, left lower limb: Secondary | ICD-10-CM

## 2015-11-13 DIAGNOSIS — Z77018 Contact with and (suspected) exposure to other hazardous metals: Secondary | ICD-10-CM

## 2015-11-13 DIAGNOSIS — M19072 Primary osteoarthritis, left ankle and foot: Secondary | ICD-10-CM | POA: Diagnosis not present

## 2015-11-13 DIAGNOSIS — Z01818 Encounter for other preprocedural examination: Secondary | ICD-10-CM | POA: Diagnosis not present

## 2015-11-25 ENCOUNTER — Encounter: Payer: Self-pay | Admitting: Family Medicine

## 2015-12-02 MED ORDER — SILDENAFIL CITRATE 100 MG PO TABS: 50.0000 mg | ORAL_TABLET | Freq: Every day | ORAL | 12 refills | Status: DC | PRN

## 2016-03-23 ENCOUNTER — Encounter: Payer: Self-pay | Admitting: Family Medicine

## 2016-04-01 ENCOUNTER — Other Ambulatory Visit: Payer: Self-pay | Admitting: Family Medicine

## 2016-10-07 ENCOUNTER — Other Ambulatory Visit: Payer: Self-pay | Admitting: Family Medicine

## 2016-10-28 ENCOUNTER — Emergency Department (HOSPITAL_COMMUNITY): Payer: BLUE CROSS/BLUE SHIELD

## 2016-10-28 ENCOUNTER — Emergency Department (HOSPITAL_COMMUNITY)
Admission: EM | Admit: 2016-10-28 | Discharge: 2016-10-28 | Disposition: A | Discharge status: Home / Self Care | Payer: BLUE CROSS/BLUE SHIELD | Attending: Emergency Medicine | Admitting: Emergency Medicine

## 2016-10-28 ENCOUNTER — Encounter (HOSPITAL_COMMUNITY): Payer: Self-pay | Admitting: Emergency Medicine

## 2016-10-28 DIAGNOSIS — Z7982 Long term (current) use of aspirin: Secondary | ICD-10-CM | POA: Insufficient documentation

## 2016-10-28 DIAGNOSIS — R1032 Left lower quadrant pain: Secondary | ICD-10-CM | POA: Diagnosis present

## 2016-10-28 DIAGNOSIS — Z87891 Personal history of nicotine dependence: Secondary | ICD-10-CM | POA: Insufficient documentation

## 2016-10-28 DIAGNOSIS — K5730 Diverticulosis of large intestine without perforation or abscess without bleeding: Secondary | ICD-10-CM | POA: Diagnosis not present

## 2016-10-28 DIAGNOSIS — K5792 Diverticulitis of intestine, part unspecified, without perforation or abscess without bleeding: Secondary | ICD-10-CM | POA: Diagnosis not present

## 2016-10-28 LAB — URINALYSIS, ROUTINE W REFLEX MICROSCOPIC
BILIRUBIN URINE: NEGATIVE
GLUCOSE, UA: NEGATIVE mg/dL
Hgb urine dipstick: NEGATIVE
Ketones, ur: 20 mg/dL — AB
LEUKOCYTES UA: NEGATIVE
NITRITE: NEGATIVE
PROTEIN: NEGATIVE mg/dL
Specific Gravity, Urine: >1.046 — ABNORMAL HIGH (ref 1.005–1.030)
pH: 7 (ref 5.0–8.0)

## 2016-10-28 LAB — COMPREHENSIVE METABOLIC PANEL
ALT: 29 U/L (ref 17–63)
AST: 25 U/L (ref 15–41)
Albumin: 4.2 g/dL (ref 3.5–5.0)
Alkaline Phosphatase: 65 U/L (ref 38–126)
Anion gap: 6 (ref 5–15)
BILIRUBIN TOTAL: 1.3 mg/dL — AB (ref 0.3–1.2)
BUN: 15 mg/dL (ref 6–20)
CO2: 30 mmol/L (ref 22–32)
CREATININE: 0.81 mg/dL (ref 0.61–1.24)
Calcium: 9.3 mg/dL (ref 8.9–10.3)
Chloride: 102 mmol/L (ref 101–111)
GFR calc Af Amer: >60 mL/min (ref >60)
GLUCOSE: 107 mg/dL — AB (ref 65–99)
Potassium: 3.9 mmol/L (ref 3.5–5.1)
Sodium: 138 mmol/L (ref 135–145)
TOTAL PROTEIN: 7.2 g/dL (ref 6.5–8.1)

## 2016-10-28 LAB — CBC
HCT: 44.5 % (ref 39.0–52.0)
Hemoglobin: 14.9 g/dL (ref 13.0–17.0)
MCH: 29.3 pg (ref 26.0–34.0)
MCHC: 33.5 g/dL (ref 30.0–36.0)
MCV: 87.4 fL (ref 78.0–100.0)
PLATELETS: 217 10*3/uL (ref 150–400)
RBC: 5.09 MIL/uL (ref 4.22–5.81)
RDW: 12.7 % (ref 11.5–15.5)
WBC: 13.6 10*3/uL — AB (ref 4.0–10.5)

## 2016-10-28 LAB — LIPASE, BLOOD: Lipase: 18 U/L (ref 11–51)

## 2016-10-28 MED ORDER — KETOROLAC TROMETHAMINE 30 MG/ML IJ SOLN
15.0000 mg | Freq: Once | INTRAMUSCULAR | Status: AC
  Administered 2016-10-28: 15 mg via INTRAVENOUS
  Filled 2016-10-28: qty 1

## 2016-10-28 MED ORDER — IOPAMIDOL (ISOVUE-300) INJECTION 61%
INTRAVENOUS | Status: AC
  Administered 2016-10-28: 100 mL via INTRAVENOUS
  Filled 2016-10-28: qty 100

## 2016-10-28 MED ORDER — IOPAMIDOL (ISOVUE-300) INJECTION 61%
100.0000 mL | Freq: Once | INTRAVENOUS | Status: AC | PRN
  Administered 2016-10-28: 100 mL via INTRAVENOUS

## 2016-10-28 MED ORDER — METRONIDAZOLE 500 MG PO TABS: 500.0000 mg | ORAL_TABLET | Freq: Two times a day (BID) | ORAL | 0 refills | Status: DC

## 2016-10-28 MED ORDER — CIPROFLOXACIN HCL 500 MG PO TABS: 500.0000 mg | ORAL_TABLET | Freq: Two times a day (BID) | ORAL | 0 refills | Status: DC

## 2016-10-28 NOTE — Discharge Instructions (Signed)
Get plenty of rest, drink a lot of fluids and stay on a low fiber diet until you feel better.

## 2016-10-28 NOTE — ED Provider Notes (Signed)
[facility] DEPT Provider Note   CSN: [id number] Arrival date & time: 10/28/16  0109     History   Chief Complaint Chief Complaint  Patient presents with  . Abdominal Pain    HPI Harry Nash is a 61 y.o. male.  He presents for evaluation of left abdominal pain which started spontaneously around 7 PM last night and has persisted.  Pain is worse with palpation, and movement.  He denies fever, chills, nausea, vomiting, diarrhea, constipation, weakness or dizziness.  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  He works as a Statistician.  No prior similar problem.  There are no other known modifying factors.  HPI  Past Medical History:  Diagnosis Date  . Depression   . Headache(784.0)   . Kidney stones     Patient Active Problem List   Diagnosis Date Noted  . Tarsal tunnel syndrome of left side 11/02/2015  . Bursitis of left shoulder 08/13/2015  . Erectile dysfunction 07/23/2015  . Need for hepatitis C screening test 07/23/2015  . Weakness generalized 07/23/2015  . Pain in joint, ankle and foot 07/13/2015  . Plantar fasciitis of left foot 04/08/2015  . Back pain 04/08/2015  . Degenerative disc disease, cervical 04/08/2015  . Nonallopathic lesion of lumbosacral region 04/08/2015  . Nonallopathic lesion-rib cage 04/08/2015  . Nonallopathic lesion of cervical region 04/08/2015  . Tibialis posterior tendon tear, nontraumatic 02/18/2015  . Screening for HIV without presence of risk factors 06/11/2014  . Routine general medical examination at a health care facility 02/10/2012  . Adjustment reaction with anxiety and depression 11/11/2011  . Migraine headache 11/08/2010  . HYPERCHOLESTEROLEMIA 08/19/2009  . COLONIC POLYPS, ADENOMATOUS 08/12/2009    History reviewed. No pertinent surgical history.     Home Medications    Prior to Admission medications   Medication Sig Start Date End Date Taking? Authorizing Provider  aspirin 81 MG  tablet Take 1 tablet (81 mg total) by mouth daily. 07/24/15   [name], MD  ibuprofen (ADVIL,MOTRIN) 200 MG tablet Take 400 mg by mouth every 6 (six) hours as needed (pain). Reported on 05/29/2015    [provider]  LORazepam (ATIVAN) 0.5 MG tablet TAKE ONE TABLET TWICE A DAY AS NEEDED FOR ANXIETY-MUST LAST 30 DAYS. 10/07/16   [name], MD  rizatriptan (MAXALT) 10 MG tablet TAKE 1 TAB AT ONSET OF SYMPTOMS, MAY REPEAT IN 2 HOURS.DO NOT EXCEED 2 TABS/24 HRS. 04/01/16   [name], MD  sildenafil (VIAGRA) 100 MG tablet Take 0.5-1 tablets (50-100 mg total) by mouth daily as needed for erectile dysfunction. 12/02/15   [name], MD  Vitamin D, Ergocalciferol, (DRISDOL) 50000 units CAPS capsule Take 1 capsule (50,000 Units total) by mouth every 7 (seven) days. 04/15/15   Lyndal Pulley, DO    Family History Family History  Problem Relation Age of Onset  . Heart disease Mother   . Heart disease Father   . Hypertension Father     Social History Social History  Substance Use Topics  . Smoking status: Former Smoker    Quit date: 02/01/2003  . Smokeless tobacco: Never Used  . Alcohol use 0.6 oz/week    1 Cans of beer per week     Comment: moderate     Allergies   Gabapentin   Review of Systems Review of Systems  All other systems reviewed and are negative.    Physical Exam Updated Vital Signs BP 138/88 (BP  Location: Left Arm)   Pulse 76   Temp 98.6 F (37 C) (Oral)   Resp 18   Ht 5\' 11"  (1.803 m)   Wt 83.9 kg (185 lb)   SpO2 99%   BMI 25.80 kg/m   Physical Exam  Constitutional: He is oriented to person, place, and time. He appears well-developed and well-nourished. He appears distressed (Uncomfortable).  HENT:  Head: Normocephalic and atraumatic.  Right Ear: External ear normal.  Left Ear: External ear normal.  Eyes: Pupils are equal, round, and reactive to light. Conjunctivae and EOM are normal.  Neck: Normal range of motion and  phonation normal. Neck supple.  Cardiovascular: Normal rate, regular rhythm and normal heart sounds.   Pulmonary/Chest: Effort normal and breath sounds normal. He exhibits no bony tenderness.  Abdominal: Soft. He exhibits no mass. There is tenderness (Left mid and left lower quadrant tenderness moderate.). There is no rebound and no guarding. No hernia.  Genitourinary:  Genitourinary Comments: No palpable groin hernia.  Musculoskeletal: Normal range of motion.  Neurological: He is alert and oriented to person, place, and time. No cranial nerve deficit or sensory deficit. He exhibits normal muscle tone. Coordination normal.  Skin: Skin is warm, dry and intact.  Psychiatric: He has a normal mood and affect. His behavior is normal. Judgment and thought content normal.  Nursing note and vitals reviewed.    ED Treatments / Results  Labs (all labs ordered are listed, but only abnormal results are displayed) Labs Reviewed  COMPREHENSIVE METABOLIC PANEL - Abnormal; Notable for the following:       Result Value   Glucose, Bld 107 (*)    Total Bilirubin 1.3 (*)    All other components within normal limits  CBC - Abnormal; Notable for the following:    WBC 13.6 (*)    All other components within normal limits  LIPASE, BLOOD  URINALYSIS, ROUTINE W REFLEX MICROSCOPIC    EKG  EKG Interpretation None       Radiology No results found.  Procedures Procedures (including critical care time)  Medications Ordered in ED Medications  ketorolac (TORADOL) 30 MG/ML injection 15 mg (not administered)     Initial Impression / Assessment and Plan / ED Course  I have reviewed the triage vital signs and the nursing notes.  Pertinent labs & imaging results that were available during my care of the patient were reviewed by me and considered in my medical decision making (see chart for details).      Patient Vitals for the past 24 hrs:  BP Temp Temp src Pulse Resp SpO2 Height Weight    10/28/16 0658 138/88 - - 76 18 99 % - -  10/28/16 0200 (!) 141/84 98.6 F (37 C) Oral 84 16 98 % 5\' 11"  (1.803 m) 83.9 kg (185 lb)    At discharge- reevaluation with update and discussion. After initial assessment and treatment, an updated evaluation reveals he remains comfortable.  Findings discussed with the patient and all questions were answered. Cambell Rickenbach L      Final Clinical Impressions(s) / ED Diagnoses   Final diagnoses:  Diverticulitis   Uncomplicated diverticulitis.  Nontoxic patient.  Pain is mild and can be managed at home.  Nursing Notes Reviewed/ Care Coordinated Applicable Imaging Reviewed Interpretation of Laboratory Data incorporated into ED treatment  The patient appears reasonably screened and/or stabilized for discharge and I doubt any other medical condition or other [facility] requiring further screening, evaluation, or treatment in the ED at this  time prior to discharge.  Plan: Home Medications-  over-the-counter analgesia; Home Treatments-rest, low fiber diet; return here if the recommended treatment, does not improve the symptoms; Recommended follow up-PCP checkup 1 week and as needed.   New Prescriptions New Prescriptions   No medications on file     Daleen Bo, MD 10/31/16 1101

## 2016-10-28 NOTE — ED Triage Notes (Signed)
Patient states that he does physical work and ended up hurting himself some how. He states that it has progressively gotten worse. Patient is having pain in left lower abdominal pain.

## 2016-10-28 NOTE — ED Notes (Signed)
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

## 2016-10-28 NOTE — ED Notes (Signed)
Spoke w/ Pt and apologized for wait.  Pt just concerned that he did not get updated regularly.  Pt pleased with care since being placed in room.

## 2016-10-28 NOTE — ED Notes (Signed)
This Probation officer received a call of the Pt's wife.  Wife is livid that the Pt had to wait.  Tried to reassure her that care was started while he waited.   Service recovery attempt was unsuccessful.  Wife is demanding to speak with patient experience and billing.  Patient Experience number had previously been provided.

## 2016-11-03 ENCOUNTER — Encounter: Payer: Self-pay | Admitting: Family Medicine

## 2016-11-03 ENCOUNTER — Telehealth: Payer: Self-pay | Admitting: Family Medicine

## 2016-11-03 NOTE — Telephone Encounter (Signed)
Wife called because her husband Harry Nash was just diagnosed with Diverticulitis. He really would like to see Dr. Andria Frames. I scheduled him with Dr. 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 She would like Dr. Andria Frames to call her when possible and see if Dr. Andria Frames can fit Harry Nash in somewhere else, jw

## 2016-11-07 ENCOUNTER — Ambulatory Visit (INDEPENDENT_AMBULATORY_CARE_PROVIDER_SITE_OTHER): Payer: BLUE CROSS/BLUE SHIELD | Admitting: Family Medicine

## 2016-11-07 ENCOUNTER — Encounter: Payer: Self-pay | Admitting: Family Medicine

## 2016-11-07 VITALS — BP 108/68 | HR 72 | Temp 98.3°F | Ht 71.0 in | Wt 181.8 lb

## 2016-11-07 DIAGNOSIS — D126 Benign neoplasm of colon, unspecified: Secondary | ICD-10-CM | POA: Diagnosis not present

## 2016-11-07 DIAGNOSIS — E278 Other specified disorders of adrenal gland: Secondary | ICD-10-CM | POA: Diagnosis not present

## 2016-11-07 DIAGNOSIS — K5732 Diverticulitis of large intestine without perforation or abscess without bleeding: Secondary | ICD-10-CM

## 2016-11-07 NOTE — Progress Notes (Signed)
Subjective:    Harry Nash is a 61 y.o. male who presents to [facility] today for diverticulitis:  1.  Diverticulitis:  Patient seen last week for diverticulitis.  Abdominal pain started day prior to presentation to ED.  Began with sharp stabbing pain in LLQ.  No fevers or chills.  No nausea or vomiting.  No diarrhea. Became eventually became unrelenting and he therefore presented to ED.  CT scan revealed diverticulitis.  Treated with Cipro/Flagyl.    This was last week.  Since then, he has been improving.  Still with very faint tenderness LLQ on deep palpation but otherwise no symptoms.  Eating and drinking well.  Feels energy has returned.  Normal BMs.  No fevers or chills. He is worried about recurrence and wants to know what he can do to prevent this.  Hasn't yet changed his diet, which is mostly vegetables.     ROS as above per HPI.    The following portions of the patient's history were reviewed and updated as appropriate: allergies, current medications, past medical history, family and social history, and problem list. Patient is a nonsmoker.    PMH reviewed.  Past Medical History:  Diagnosis Date  . Depression   . Headache(784.0)   . Kidney stones    No past surgical history on file.  Medications reviewed. Current Outpatient Prescriptions  Medication Sig Dispense Refill  . aspirin 81 MG tablet Take 1 tablet (81 mg total) by mouth daily. 30 tablet   . ciprofloxacin (CIPRO) 500 MG tablet Take 1 tablet (500 mg total) by mouth 2 (two) times daily. One po bid x 7 days 14 tablet 0  . ibuprofen (ADVIL,MOTRIN) 200 MG tablet Take 400-600 mg by mouth every 6 (six) hours as needed (pain). Reported on 05/29/2015    . LORazepam (ATIVAN) 0.5 MG tablet TAKE ONE TABLET TWICE A DAY AS NEEDED FOR ANXIETY-MUST LAST 30 DAYS. 30 tablet 3  . metroNIDAZOLE (FLAGYL) 500 MG tablet Take 1 tablet (500 mg total) by mouth 2 (two) times daily. One po bid x 7 days 14 tablet 0  . naproxen sodium (ANAPROX) 220  MG tablet Take 220-440 mg by mouth 2 (two) times daily with a meal.    . rizatriptan (MAXALT) 10 MG tablet TAKE 1 TAB AT ONSET OF SYMPTOMS, MAY REPEAT IN 2 HOURS.DO NOT EXCEED 2 TABS/24 HRS. 9 tablet 0  . sildenafil (VIAGRA) 100 MG tablet Take 0.5-1 tablets (50-100 mg total) by mouth daily as needed for erectile dysfunction. (Patient not taking: Reported on 10/28/2016) 5 tablet 12  . Vitamin D, Ergocalciferol, (DRISDOL) 50000 units CAPS capsule Take 1 capsule (50,000 Units total) by mouth every 7 (seven) days. (Patient not taking: Reported on 10/28/2016) 12 capsule 0   No current facility-administered medications for this visit.      Objective:   Physical Exam BP 108/68   Pulse 72   Temp 98.3 F (36.8 C) (Oral)   Ht 5\' 11"  (1.803 m)   Wt 181 lb 12.8 oz (82.5 kg)   SpO2 96%   BMI 25.36 kg/m  Gen:  Alert, cooperative patient who appears stated age in no acute distress.  Looks well. Vital signs reviewed. HEENT: EOMI,  MMM Cardiac:  Regular rate and rhythm without murmur auscultated.  Good S1/S2. Pulm:  Clear to auscultation bilaterally Abd:  Soft/nondistended.  Mild tenderness to deep palpation in LLQ Exts: Non edematous BL LE, warm and well perfused.  No results found for this or any previous visit (  from the past 72 hour(s)).

## 2016-11-07 NOTE — Patient Instructions (Signed)
It was good to see you today.  I have provided some information for you in a handout.  I will also refer you back to [facility].  They will call you to schedule an appointment.  You also have an andrenal adenoma (benign lesion) on your adrenal gland.  I will ensure Dr. Andria Frames knows about this and can talk with you more about it at an upcoming appointment.  This was small, only about 2.4 cm in size.

## 2016-11-08 DIAGNOSIS — K5732 Diverticulitis of large intestine without perforation or abscess without bleeding: Secondary | ICD-10-CM | POA: Insufficient documentation

## 2016-11-08 DIAGNOSIS — E278 Other specified disorders of adrenal gland: Secondary | ICD-10-CM | POA: Insufficient documentation

## 2016-11-08 NOTE — Assessment & Plan Note (Signed)
On review of colonoscopy status, due for repeat colonoscopy in 2018.

## 2016-11-08 NOTE — Assessment & Plan Note (Signed)
Treated in ED.  Still taking abx.  Doing well.  Answered all questions.  I provided him a patient handout with more information about diverticulitis.  He is due for a repeat colonoscopy based on his medical records and would like to see GI anyway for further information as he is very concerned about recurrence of diverticulitis.  Referral placed back to [facility] today.

## 2016-11-08 NOTE — Assessment & Plan Note (Addendum)
Found on CT scan in ED.  2.4 cm in diameter.  Defer FU to PCP.  Discussed with patient as he didn't know about this. Provided reassurance.

## 2016-11-08 NOTE — Telephone Encounter (Signed)
Seen yesterday in [facility] and the issues were addressed in person.

## 2016-12-02 ENCOUNTER — Other Ambulatory Visit: Payer: Self-pay | Admitting: Family Medicine

## 2016-12-07 DIAGNOSIS — L821 Other seborrheic keratosis: Secondary | ICD-10-CM | POA: Diagnosis not present

## 2016-12-07 DIAGNOSIS — Z85828 Personal history of other malignant neoplasm of skin: Secondary | ICD-10-CM | POA: Diagnosis not present

## 2016-12-07 DIAGNOSIS — D1801 Hemangioma of skin and subcutaneous tissue: Secondary | ICD-10-CM | POA: Diagnosis not present

## 2016-12-07 DIAGNOSIS — D225 Melanocytic nevi of trunk: Secondary | ICD-10-CM | POA: Diagnosis not present

## 2017-01-03 NOTE — Progress Notes (Signed)
[name] [address], [place] [address] Phone: [phone] Subjective:    I'm seeing this patient by the request  of:    CC: Neck pain  [id number]  Harry Nash is a 61 y.o. male coming in with complaint of neck pain. His pain has become constant and is effecting his ability to sleep. Denies any radiating pain into the extremities. He did get a massage last week which was helpful.  She states that the pain is a dull throbbing aching sensation overall.  Patient states that this is the worst it has been in quite some time.  Affecting even sleep.   Patient has had x-rays previously taken April 08, 2015.  These were independently visualized by me.  Found to have diffuse osteopenia and degenerative changes of the neck.  Mild to moderate in nature.  Carotid vascular calcifications also noted.  Past Medical History:  Diagnosis Date  . Depression   . Headache(784.0)   . Kidney stones    No past surgical history on file. Social History   Socioeconomic History  . Marital status: Married    Spouse name: None  . Number of children: None  . Years of education: None  . Highest education level: None  Social Needs  . Financial resource strain: None  . Food insecurity - worry: None  . Food insecurity - inability: None  . Transportation needs - medical: None  . Transportation needs - non-medical: None  Occupational History  . None  Tobacco Use  . Smoking status: Former Smoker    Last attempt to quit: 02/01/2003    Years since quitting: 13.9  . Smokeless tobacco: Never Used  Substance and Sexual Activity  . Alcohol use: Yes    Alcohol/week: 0.6 oz    Types: 1 Cans of beer per week    Comment: moderate  . Drug use: No  . Sexual activity: Yes  Other Topics Concern  . None  Social History Narrative  . None   Allergies  Allergen Reactions  . Gabapentin Other (See Comments)    Clouded mentation   Family History  Problem Relation  Age of Onset  . Heart disease Mother   . Heart disease Father   . Hypertension Father      Past medical history, social, surgical and family history all reviewed in electronic medical record.  No pertanent information unless stated regarding to the chief complaint.   Review of Systems:Review of systems updated and as accurate as of 01/04/17  No , visual changes, nausea, vomiting, diarrhea, constipation, dizziness, abdominal pain, skin rash, fevers, chills, night sweats, weight loss, swollen lymph nodes, body aches, joint swelling,  chest pain, shortness of breath, mood changes.  Positive headaches, muscle aches  Objective  Blood pressure 122/78, pulse 74, height 5\' 11"  (1.803 m), weight 188 lb (85.3 kg), SpO2 97 %. Systems examined below as of 01/04/17   General: No apparent distress alert and oriented x3 mood and affect normal, dressed appropriately.  HEENT: Pupils equal, extraocular movements intact  Respiratory: Patient's speak in full sentences and does not appear short of breath  Cardiovascular: No lower extremity edema, non tender, no erythema  Skin: Warm dry intact with no signs of infection or rash on extremities or on axial skeleton.  Abdomen: Soft nontender  Neuro: Cranial nerves II through XII are intact, neurovascularly intact in all extremities with 2+ DTRs and 2+ pulses.  Lymph: No lymphadenopathy of posterior or anterior cervical  chain or axillae bilaterally.  Gait normal with good balance and coordination.  MSK:  Non tender with full range of motion and good stability and symmetric strength and tone of shoulders, elbows, wrist, hip, knee and ankles bilaterally.  Mild arthritic changes Neck: Inspection loss of lordosis. No palpable stepoffs. Negative Spurling's maneuver. Minimal side bending bilaterally.  Patient does have crepitus with range of motion.  Does have near full extension but does have some difficulty with flexion Grip strength and sensation normal in  bilateral hands Strength good C4 to T1 distribution No sensory change to C4 to T1 Negative Hoffman sign bilaterally Reflexes normal  97110; 15 additional minutes spent for Therapeutic exercises as stated in above notes.  This included exercises focusing on stretching, strengthening, with significant focus on eccentric aspects.   Long term goals include an improvement in range of motion, strength, endurance as well as avoiding reinjury. Patient's frequency would include in 1-2 times a day, 3-5 times a week for a duration of 6-12 weeks. Exercises that included:  Basic scapular stabilization to include adduction and depression of scapula Scaption, focusing on proper movement and good control Rows with theraband which was given   Proper technique shown and discussed handout in great detail with ATC.  All questions were discussed and answered.     Impression and Recommendations:     This case required medical decision making of moderate complexity.      Note: This dictation was prepared with Dragon dictation along with smaller phrase technology. Any transcriptional errors that result from this process are unintentional.

## 2017-01-04 ENCOUNTER — Encounter: Payer: Self-pay | Admitting: Family Medicine

## 2017-01-04 ENCOUNTER — Other Ambulatory Visit: Payer: Self-pay | Admitting: Family Medicine

## 2017-01-04 ENCOUNTER — Ambulatory Visit: Payer: BLUE CROSS/BLUE SHIELD | Admitting: Family Medicine

## 2017-01-04 DIAGNOSIS — M5030 Other cervical disc degeneration, unspecified cervical region: Secondary | ICD-10-CM

## 2017-01-04 MED ORDER — PREDNISONE 50 MG PO TABS: 50.0000 mg | ORAL_TABLET | Freq: Every day | ORAL | 0 refills | Status: DC

## 2017-01-04 MED ORDER — TIZANIDINE HCL 4 MG PO TABS: 4.0000 mg | ORAL_TABLET | Freq: Four times a day (QID) | ORAL | 0 refills | Status: DC | PRN

## 2017-01-04 MED ORDER — GABAPENTIN 100 MG PO CAPS: 200.0000 mg | ORAL_CAPSULE | Freq: Every day | ORAL | 3 refills | Status: DC

## 2017-01-04 NOTE — Assessment & Plan Note (Signed)
Believe the patient is having exacerbation of the.  Warned of potential side effects of all the medications.  Patient will hold on the over-the-counter anti-inflammatories.  Having no radicular symptoms at this time so do not feel that advanced imaging is warranted.  Patient was given home exercises and work with Product/process development scientist greater detail.  Patient will follow up with me again 2 wee

## 2017-01-04 NOTE — Patient Instructions (Addendum)
Good to see you  Ice 20 minutes 2 times daily. Usually after activity and before bed. Exercises 3 times a week.  Prednisone daily for 5 days Gabapentin 200mg  at nigh t Zanaflex up to 3 times a day but may make you drowsy  No ibuprofen or naproxen with the prednisone  Keep hands within peripheral vision  See me again in 2 weeks

## 2017-01-17 DIAGNOSIS — R933 Abnormal findings on diagnostic imaging of other parts of digestive tract: Secondary | ICD-10-CM | POA: Diagnosis not present

## 2017-01-17 DIAGNOSIS — Z86010 Personal history of colonic polyps: Secondary | ICD-10-CM | POA: Diagnosis not present

## 2017-02-08 ENCOUNTER — Other Ambulatory Visit: Payer: Self-pay | Admitting: Family Medicine

## 2017-03-09 ENCOUNTER — Other Ambulatory Visit: Payer: Self-pay | Admitting: Family Medicine

## 2017-04-03 DIAGNOSIS — R933 Abnormal findings on diagnostic imaging of other parts of digestive tract: Secondary | ICD-10-CM | POA: Diagnosis not present

## 2017-04-03 DIAGNOSIS — K648 Other hemorrhoids: Secondary | ICD-10-CM | POA: Diagnosis not present

## 2017-04-03 DIAGNOSIS — Z86010 Personal history of colonic polyps: Secondary | ICD-10-CM | POA: Diagnosis not present

## 2017-04-03 DIAGNOSIS — K5730 Diverticulosis of large intestine without perforation or abscess without bleeding: Secondary | ICD-10-CM | POA: Diagnosis not present

## 2017-04-12 ENCOUNTER — Other Ambulatory Visit: Payer: Self-pay | Admitting: Family Medicine

## 2017-04-12 DIAGNOSIS — F4323 Adjustment disorder with mixed anxiety and depressed mood: Secondary | ICD-10-CM

## 2017-04-18 DIAGNOSIS — J309 Allergic rhinitis, unspecified: Secondary | ICD-10-CM | POA: Diagnosis not present

## 2017-04-19 ENCOUNTER — Encounter: Payer: Self-pay | Admitting: Family Medicine

## 2017-04-20 ENCOUNTER — Ambulatory Visit: Payer: BLUE CROSS/BLUE SHIELD | Admitting: Family Medicine

## 2017-04-21 ENCOUNTER — Ambulatory Visit: Payer: BLUE CROSS/BLUE SHIELD | Admitting: Family Medicine

## 2017-04-21 VITALS — BP 140/82 | HR 90 | Temp 98.4°F | Wt 179.0 lb

## 2017-04-21 DIAGNOSIS — R05 Cough: Secondary | ICD-10-CM

## 2017-04-21 DIAGNOSIS — R058 Other specified cough: Secondary | ICD-10-CM | POA: Insufficient documentation

## 2017-04-21 NOTE — Assessment & Plan Note (Addendum)
Acute.  Nonproductive cough without signs of pneumonia.  Congestion located primarily in chest without signs of sinusitis. - Advised patient to use honey as needed and maintain good hydration while stopping Sudafed and other decongestions and other OTC cough suppressants - Reviewed return precautions - RTC on an as-needed basis if symptoms worsen

## 2017-04-21 NOTE — Progress Notes (Signed)
   Subjective   Patient ID: Harry Nash    DOB: December 05, 1955, 62 y.o. male   MRN: [id number]  CC: "Cough"  HPI: Harry Nash is a 62 y.o. male who presents for a same day appointment for the following:  URI  Has been sick for 15 days. Nasal discharge: Yes Medications tried: Sudafed, NyQuil, Promethazine DM Sick contacts: No  Symptoms Fever: No Headache or face pain: No Tooth pain: No Sneezing: No Scratchy throat: No Allergies: No Muscle aches: No Severe fatigue: No Stiff neck: No Shortness of breath: No Rash: No Sore throat or swollen glands: No  ROS: see HPI for pertinent.   Waterflow: Migraines, nephrolithiasis, depression.  Surgical history unremarkable.  Family history heart disease, HTN.  Smoking status reviewed. Medications reviewed.  Objective   BP 140/82 (BP Location: Left Arm)   Pulse 90   Temp 98.4 F (36.9 C) (Oral)   Wt 179 lb (81.2 kg)   SpO2 94%   BMI 24.97 kg/m  Vitals and nursing note reviewed.  General: well nourished, well developed, NAD with non-toxic appearance HEENT: normocephalic, atraumatic, moist mucous membranes, pink conjunctivae, no maxillary tenderness, postnasal drip present with nonedematous tonsils Neck: supple, non-tender without lymphadenopathy Cardiovascular: regular rate and rhythm without murmurs, rubs, or gallops Lungs: clear to auscultation bilaterally with normal work of breathing Skin: warm, dry, no rashes or lesions, cap refill < 2 seconds Extremities: warm and well perfused, normal tone, no edema  Assessment & Plan   Post-viral cough syndrome Acute.  Nonproductive cough without signs of pneumonia.  Congestion located primarily in chest without signs of sinusitis. - Advised patient to use honey as needed and maintain good hydration while stopping Sudafed and other decongestions and other OTC cough suppressants - Reviewed return precautions - RTC on an as-needed basis if symptoms worsen  No orders of the defined  types were placed in this encounter.  No orders of the defined types were placed in this encounter.   [name], [place], PGY-2 04/21/2017, 4:10 PM

## 2017-04-21 NOTE — Patient Instructions (Signed)
Thank you for coming in to see [place] today. Please see below to review our plan for today's visit.  Your symptoms are likely from a viral illness.  I would advise you to take honey throughout the day as needed for your cough.  Sitting more upright when you sleep will help prevent coughing at night.  Drink plenty of fluids to keep up with your losses.  If you continue having cough beyond 1 month from the beginning of her illness, I would like you to return to the clinic for reevaluation.  I anticipate your symptoms significantly improving over the course of the next week.  Please call the clinic at [phone] if your symptoms worsen or you have any concerns. It was our pleasure to serve you.  [name], [place], PGY-2

## 2017-08-01 ENCOUNTER — Encounter: Payer: Self-pay | Admitting: Family Medicine

## 2017-08-01 ENCOUNTER — Ambulatory Visit: Payer: BLUE CROSS/BLUE SHIELD | Admitting: Family Medicine

## 2017-08-01 DIAGNOSIS — M5030 Other cervical disc degeneration, unspecified cervical region: Secondary | ICD-10-CM

## 2017-08-01 MED ORDER — VITAMIN D (ERGOCALCIFEROL) 1.25 MG (50000 UNIT) PO CAPS: 50000.0000 [IU] | ORAL_CAPSULE | ORAL | 0 refills | Status: DC

## 2017-08-01 NOTE — Progress Notes (Signed)
[facility] [address], [place] [address] Phone: [phone] Subjective:     CC: Neck pain  [id number]  Chester Sibert [name] is a 62 y.o. male coming in with complaint of neck pain.  Patient is seen in the past for this previously.  Patient did have an exacerbation of severe degenerative disc disease of the cervical spine.  Patient states was doing much better and recently has started working out again.  More discomfort again.  Mild radiation to the arms.  No numbness.  Patient has been fairly noncompliant with treatment other than prednisone in the past.     Past Medical History:  Diagnosis Date  . Depression   . Headache(784.0)   . Kidney stones    No past surgical history on file. Social History   Socioeconomic History  . Marital status: Married    Spouse name: Not on file  . Number of children: Not on file  . Years of education: Not on file  . Highest education level: Not on file  Occupational History  . Not on file  Social Needs  . Financial resource strain: Not on file  . Food insecurity:    Worry: Not on file    Inability: Not on file  . Transportation needs:    Medical: Not on file    Non-medical: Not on file  Tobacco Use  . Smoking status: Former Smoker    Last attempt to quit: 02/01/2003    Years since quitting: 14.5  . Smokeless tobacco: Never Used  Substance and Sexual Activity  . Alcohol use: Yes    Alcohol/week: 0.6 oz    Types: 1 Cans of beer per week    Comment: moderate  . Drug use: No  . Sexual activity: Yes  Lifestyle  . Physical activity:    Days per week: Not on file    Minutes per session: Not on file  . Stress: Not on file  Relationships  . Social connections:    Talks on phone: Not on file    Gets together: Not on file    Attends religious service: Not on file    Active member of club or organization: Not on file    Attends meetings of clubs or organizations: Not on file    Relationship  status: Not on file  Other Topics Concern  . Not on file  Social History Narrative  . Not on file   Allergies  Allergen Reactions  . Gabapentin Other (See Comments)    Clouded mentation   Family History  Problem Relation Age of Onset  . Heart disease Mother   . Heart disease Father   . Hypertension Father      Past medical history, social, surgical and family history all reviewed in electronic medical record.  No pertanent information unless stated regarding to the chief complaint.   Review of Systems:Review of systems updated and as accurate as of 08/01/17  No headache, visual changes, nausea, vomiting, diarrhea, constipation, dizziness, abdominal pain, skin rash, fevers, chills, night sweats, weight loss, swollen lymph nodes, body aches, joint swelling,chest pain, shortness of breath, mood changes.  Aches of muscles  Objective  Blood pressure 124/80, pulse 87, height 5\' 11"  (1.803 m), weight 185 lb (83.9 kg), SpO2 97 %. Systems examined below as of 08/01/17   General: No apparent distress alert and oriented x3 mood and affect normal, dressed appropriately.  HEENT: Pupils equal, extraocular movements intact  Respiratory: Patient's speak in  full sentences and does not appear short of breath  Cardiovascular: No lower extremity edema, non tender, no erythema  Skin: Warm dry intact with no signs of infection or rash on extremities or on axial skeleton.  Abdomen: Soft nontender  Neuro: Cranial nerves II through XII are intact, neurovascularly intact in all extremities with 2+ DTRs and 2+ pulses.  Lymph: No lymphadenopathy of posterior or anterior cervical chain or axillae bilaterally.  Gait normal with good balance and coordination.  MSK:  Non tender with full range of motion and good stability and symmetric strength and tone of shoulders, elbows, wrist, hip, knee and ankles bilaterally.  Mild arthritic changes of multiple joints  Neck: Inspection loss of lordosis. No palpable  stepoffs. Negative Spurling's maneuver. Lacks last 10 degrees of sidebending bilaterally and lacks last 5 degrees of rotation to the left Grip strength and sensation normal in bilateral hands Strength good C4 to T1 distribution No sensory change to C4 to T1 Negative Hoffman sign bilaterally Reflexes normal Tightness trapezius bilaterally    Impression and Recommendations:     This case required medical decision making of moderate complexity.      Note: This dictation was prepared with Dragon dictation along with smaller phrase technology. Any transcriptional errors that result from this process are unintentional.

## 2017-08-01 NOTE — Assessment & Plan Note (Signed)
Degenerative disc disease.  Patient declined any formal physical therapy or any manipulation.  Discussed icing regimen and home exercises.  Discussed which activities of doing which wants to avoid.  Patient encouraged to try the gabapentin which he was fairly noncompliant previously.  Has had muscle relaxer for breakthrough.  Also once weekly vitamin D given.  Follow-up again in 4 weeks

## 2017-08-01 NOTE — Patient Instructions (Signed)
Good to see you  Ice 20 minutes 2 times daily. Usually after activity and before bed. Keep hands within peripheral vision  Once weekly vitamin D for 12 weeks Tart cherry extract any dose at night Continue the other vitamins See me again in 4-6 weeks to make sure yo [place] making progress

## 2017-09-20 ENCOUNTER — Other Ambulatory Visit: Payer: Self-pay | Admitting: Family Medicine

## 2017-10-25 ENCOUNTER — Other Ambulatory Visit: Payer: Self-pay | Admitting: Family Medicine

## 2017-10-25 DIAGNOSIS — F4323 Adjustment disorder with mixed anxiety and depressed mood: Secondary | ICD-10-CM

## 2017-12-11 DIAGNOSIS — D485 Neoplasm of uncertain behavior of skin: Secondary | ICD-10-CM | POA: Diagnosis not present

## 2017-12-11 DIAGNOSIS — L820 Inflamed seborrheic keratosis: Secondary | ICD-10-CM | POA: Diagnosis not present

## 2017-12-11 DIAGNOSIS — L814 Other melanin hyperpigmentation: Secondary | ICD-10-CM | POA: Diagnosis not present

## 2017-12-11 DIAGNOSIS — L821 Other seborrheic keratosis: Secondary | ICD-10-CM | POA: Diagnosis not present

## 2017-12-11 DIAGNOSIS — D225 Melanocytic nevi of trunk: Secondary | ICD-10-CM | POA: Diagnosis not present

## 2017-12-11 DIAGNOSIS — Z85828 Personal history of other malignant neoplasm of skin: Secondary | ICD-10-CM | POA: Diagnosis not present

## 2018-01-08 ENCOUNTER — Telehealth: Payer: Self-pay

## 2018-01-08 NOTE — Telephone Encounter (Signed)
Left message for patient to call back to schedule.  °

## 2018-01-09 ENCOUNTER — Ambulatory Visit: Payer: Self-pay

## 2018-01-11 ENCOUNTER — Encounter: Payer: Self-pay | Admitting: Family Medicine

## 2018-01-11 ENCOUNTER — Ambulatory Visit: Payer: Self-pay

## 2018-01-11 ENCOUNTER — Ambulatory Visit: Payer: BLUE CROSS/BLUE SHIELD | Admitting: Family Medicine

## 2018-01-11 VITALS — BP 130/78 | HR 66 | Ht 71.0 in | Wt 186.0 lb

## 2018-01-11 DIAGNOSIS — S92819A Other fracture of unspecified foot, initial encounter for closed fracture: Secondary | ICD-10-CM | POA: Insufficient documentation

## 2018-01-11 DIAGNOSIS — M79672 Pain in left foot: Secondary | ICD-10-CM

## 2018-01-11 DIAGNOSIS — S92812A Other fracture of left foot, initial encounter for closed fracture: Secondary | ICD-10-CM

## 2018-01-11 NOTE — Progress Notes (Signed)
[facility] [address] [place], [place] [address] Phone: [phone] Subjective:   [name] No, am serving as a scribe for [name].    CC: Left foot pain  FGH:[id number]  Harry Nash is a 62 y.o. male coming in with complaint of left foot pain. Slide down a banister and landed with foot in dorsiflexion. Injury occurred on Sunday the 8th. Pain over the 2nd-4th metatarsal heads. Pain with weight bearing. Has used ice, Arnica.       Past Medical History:  Diagnosis Date  . Depression   . Headache(784.0)   . Kidney stones    No past surgical history on file. Social History   Socioeconomic History  . Marital status: Married    Spouse name: Not on file  . Number of children: Not on file  . Years of education: Not on file  . Highest education level: Not on file  Occupational History  . Not on file  Social Needs  . Financial resource strain: Not on file  . Food insecurity:    Worry: Not on file    Inability: Not on file  . Transportation needs:    Medical: Not on file    Non-medical: Not on file  Tobacco Use  . Smoking status: Former Smoker    Last attempt to quit: 02/01/2003    Years since quitting: 14.9  . Smokeless tobacco: Never Used  Substance and Sexual Activity  . Alcohol use: Yes    Alcohol/week: 1.0 standard drinks    Types: 1 Cans of beer per week    Comment: moderate  . Drug use: No  . Sexual activity: Yes  Lifestyle  . Physical activity:    Days per week: Not on file    Minutes per session: Not on file  . Stress: Not on file  Relationships  . Social connections:    Talks on phone: Not on file    Gets together: Not on file    Attends religious service: Not on file    Active member of club or organization: Not on file    Attends meetings of clubs or organizations: Not on file    Relationship status: Not on file  Other Topics Concern  . Not on file  Social History Narrative  . Not on file    Allergies  Allergen Reactions  . Gabapentin Other (See Comments)    Clouded mentation   Family History  Problem Relation Age of Onset  . Heart disease Mother   . Heart disease Father   . Hypertension Father     Current Outpatient Medications (Endocrine & Metabolic):  .  predniSONE (DELTASONE) 50 MG tablet, Take 1 tablet (50 mg total) by mouth daily.  Current Outpatient Medications (Cardiovascular):  .  sildenafil (VIAGRA) 100 MG tablet, TAKE 1/2 TO 1 TABLET BY MOUTH DAILY AS NEEDED FOR ERECTILE DYSFUNCTION.   Current Outpatient Medications (Analgesics):  .  aspirin 81 MG tablet, Take 1 tablet (81 mg total) by mouth daily. Marland Kitchen  ibuprofen (ADVIL,MOTRIN) 200 MG tablet, Take 400-600 mg by mouth every 6 (six) hours as needed (pain). Reported on 05/29/2015 .  naproxen sodium (ANAPROX) 220 MG tablet, Take 220-440 mg by mouth 2 (two) times daily with a meal. .  rizatriptan (MAXALT) 10 MG tablet, TAKE 1 TAB AT ONSET OF SYMPTOMS, MAY REPEAT IN 2 HOURS.DO NOT EXCEED 2 TABS/24 HRS.   Current Outpatient Medications (Other):  .  ciprofloxacin (CIPRO) 500 MG  tablet, Take 1 tablet (500 mg total) by mouth 2 (two) times daily. One po bid x 7 days .  gabapentin (NEURONTIN) 100 MG capsule, Take 2 capsules (200 mg total) by mouth at bedtime. Marland Kitchen  LORazepam (ATIVAN) 0.5 MG tablet, TAKE ONE TABLET TWICE A DAY AS NEEDED FOR ANXIETY-MUST LAST 30 DAYS. Marland Kitchen  metroNIDAZOLE (FLAGYL) 500 MG tablet, Take 1 tablet (500 mg total) by mouth 2 (two) times daily. One po bid x 7 days .  tiZANidine (ZANAFLEX) 4 MG tablet, Take 1 tablet (4 mg total) by mouth every 6 (six) hours as needed for muscle spasms. .  Vitamin D, Ergocalciferol, (DRISDOL) 50000 units CAPS capsule, Take 1 capsule (50,000 Units total) by mouth every 7 (seven) days.    Past medical history, social, surgical and family history all reviewed in electronic medical record.  No pertanent information unless stated regarding to the chief complaint.   Review  of Systems:  No headache, visual changes, nausea, vomiting, diarrhea, constipation, dizziness, abdominal pain, skin rash, fevers, chills, night sweats, weight loss, swollen lymph nodes, body aches,  muscle aches, chest pain, shortness of breath, mood changes.  Positive joint swelling  Objective  Blood pressure 130/78, pulse 66, height 5\' 11"  (1.803 m), weight 186 lb (84.4 kg), SpO2 99 %. Sy   General: No apparent distress alert and oriented x3 mood and affect normal, dressed appropriately.  HEENT: Pupils equal, extraocular movements intact  Respiratory: Patient's speak in full sentences and does not appear short of breath  Cardiovascular: No lower extremity edema, non tender, no erythema  Skin: Warm dry intact with no signs of infection or rash on extremities or on axial skeleton.  Abdomen: Soft nontender  Neuro: Cranial nerves II through XII are intact, neurovascularly intact in all extremities with 2+ DTRs and 2+ pulses.  Lymph: No lymphadenopathy of posterior or anterior cervical chain or axillae bilaterally.  Gait normal with good balance and coordination.  MSK:  Non tender with full range of motion and good stability and symmetric strength and tone of shoulders, elbows, wrist, hip, knee and ankles bilaterally.  Left foot exam shows the patient does have bruising between toes 2 through 4.  Patient is minimally tender more on the plantar aspect of the toe 4.  Patient has no significant amount of bruising noted.  Patient is not tender severely on the dorsal aspect of the toes.  Limited musculoskeletal ultrasound was performed and interpreted by Lyndal Pulley  Limited ultrasound of patient's toes show that patient may have a very small cortical defect of the third phalanx.  No significant displacement noted.  Patient does have significant soft tissue swelling between the second and third and third and fourth toes is likely muscle injury with some mild bleeding noted.  Patient does have good  vascularity noted to the area.  Plantar aspect of the foot does show what appears to be a sesamoid fracture that is small and nondisplaced around the fourth metatarsal Impression: Sesamoid fracture and soft tissue injuries to the foot    Impression and Recommendations:     This case required medical decision making of moderate complexity. The above documentation has been reviewed and is accurate and complete Lyndal Pulley, DO       Note: This dictation was prepared with Dragon dictation along with smaller phrase technology. Any transcriptional errors that result from this process are unintentional.

## 2018-01-11 NOTE — Assessment & Plan Note (Signed)
New problem.  Sesamoid fracture.  Discussed rigid sole shoes.  Patient declined a postop shoe.  Discussed icing regimen and home exercise.  Discussed Arnica.  Discussed avoiding any type of flexing for the next 3 weeks.  Follow-up in 3 weeks to further evaluate

## 2018-01-11 NOTE — Patient Instructions (Signed)
Good to see you  Small fracture Rigid sole shoes No going barefoot Arnica 2 ties a day  Heat or ice whatever feels better Once weekly vitamin D  Have the school call Ria Comment at [phone] See me again in 3ish weeks (ok to double book)

## 2018-02-01 NOTE — Progress Notes (Signed)
[name] [place], [place] [address] Phone: [phone] Subjective:   [name] No, am serving as a scribe for [name]. I'm seeing this patient by the request  of:    CC: Foot pain follow-up  DPO:[id number]  Harry Nash is a 63 y.o. male coming in with complaint of foot pain follow-up.  Patient was seen previously and did have possible sesamoid fracture as well as soft tissue injury of the foot.  Patient states that the pain on the posterior and plantar aspects of the foot is significantly better pending, fortunately continues to have pain more in the toes themselves.  Rates the severity is 7 out of 10.  Unable to push off his foot secondary to the discomfort.  No pain in the midfoot.     Past Medical History:  Diagnosis Date  . Depression   . Headache(784.0)   . Kidney stones    No past surgical history on file. Social History   Socioeconomic History  . Marital status: Married    Spouse name: Not on file  . Number of children: Not on file  . Years of education: Not on file  . Highest education level: Not on file  Occupational History  . Not on file  Social Needs  . Financial resource strain: Not on file  . Food insecurity:    Worry: Not on file    Inability: Not on file  . Transportation needs:    Medical: Not on file    Non-medical: Not on file  Tobacco Use  . Smoking status: Former Smoker    Last attempt to quit: 02/01/2003    Years since quitting: 15.0  . Smokeless tobacco: Never Used  Substance and Sexual Activity  . Alcohol use: Yes    Alcohol/week: 1.0 standard drinks    Types: 1 Cans of beer per week    Comment: moderate  . Drug use: No  . Sexual activity: Yes  Lifestyle  . Physical activity:    Days per week: Not on file    Minutes per session: Not on file  . Stress: Not on file  Relationships  . Social connections:    Talks on phone: Not on file    Gets together: Not on file    Attends  religious service: Not on file    Active member of club or organization: Not on file    Attends meetings of clubs or organizations: Not on file    Relationship status: Not on file  Other Topics Concern  . Not on file  Social History Narrative  . Not on file   Allergies  Allergen Reactions  . Gabapentin Other (See Comments)    Clouded mentation   Family History  Problem Relation Age of Onset  . Heart disease Mother   . Heart disease Father   . Hypertension Father      Current Outpatient Medications (Cardiovascular):  .  sildenafil (VIAGRA) 100 MG tablet, TAKE 1/2 TO 1 TABLET BY MOUTH DAILY AS NEEDED FOR ERECTILE DYSFUNCTION.   Current Outpatient Medications (Analgesics):  .  aspirin 81 MG tablet, Take 1 tablet (81 mg total) by mouth daily. Marland Kitchen  ibuprofen (ADVIL,MOTRIN) 200 MG tablet, Take 400-600 mg by mouth every 6 (six) hours as needed (pain). Reported on 05/29/2015 .  naproxen sodium (ANAPROX) 220 MG tablet, Take 220-440 mg by mouth 2 (two) times daily with a meal. .  rizatriptan (MAXALT) 10 MG tablet, TAKE  1 TAB AT ONSET OF SYMPTOMS, MAY REPEAT IN 2 HOURS.DO NOT EXCEED 2 TABS/24 HRS.   Current Outpatient Medications (Other):  .  gabapentin (NEURONTIN) 100 MG capsule, Take 2 capsules (200 mg total) by mouth at bedtime. Marland Kitchen  LORazepam (ATIVAN) 0.5 MG tablet, TAKE ONE TABLET TWICE A DAY AS NEEDED FOR ANXIETY-MUST LAST 30 DAYS. Marland Kitchen  tiZANidine (ZANAFLEX) 4 MG tablet, Take 1 tablet (4 mg total) by mouth every 6 (six) hours as needed for muscle spasms. .  Vitamin D, Ergocalciferol, (DRISDOL) 50000 units CAPS capsule, Take 1 capsule (50,000 Units total) by mouth every 7 (seven) days.    Past medical history, social, surgical and family history all reviewed in electronic medical record.  No pertanent information unless stated regarding to the chief complaint.   Review of Systems:  No headache, visual changes, nausea, vomiting, diarrhea, constipation, dizziness, abdominal pain, skin  rash, fevers, chills, night sweats, weight loss, swollen lymph nodes, body aches, joint swelling,  chest pain, shortness of breath, mood changes.  Positive muscle aches  Objective  Blood pressure 122/78, pulse 70, height 5\' 11"  (1.803 m), weight 190 lb (86.2 kg), SpO2 98 %.    General: No apparent distress alert and oriented x3 mood and affect normal, dressed appropriately.  HEENT: Pupils equal, extraocular movements intact  Respiratory: Patient's speak in full sentences and does not appear short of breath  Cardiovascular: No lower extremity edema, non tender, no erythema  Skin: Warm dry intact with no signs of infection or rash on extremities or on axial skeleton.  Abdomen: Soft nontender  Neuro: Cranial nerves II through XII are intact, neurovascularly intact in all extremities with 2+ DTRs and 2+ pulses.  Lymph: No lymphadenopathy of posterior or anterior cervical chain or axillae bilaterally.  Gait normal with good balance and coordination.  MSK:  Non tender with full range of motion and good stability and symmetric strength and tone of shoulders, elbows, wrist, hip, knee bilaterally.  Foot exam shows the patient does have breakdown of the transverse arch noted.  Some mild tibial deviation of the large toe noted.  Mild splaying between the second and third toes noted.  Patient does have discomfort to palpation over the third metatarsal and more between the third and fourth.  Positive squeeze test.  Hypermobility of the ankles bilaterally contralateral ankle unremarkable  Imaging musculoskeletal ultrasound was performed and interpreted by Lyndal Pulley  Limited ultrasound does not show any signs of synovitis.  Patient sesamoid bone appears to be doing much better than previous exam.  Patient does have what appears to be a right reactive neuroma noted between the third and fourth metatarsal head.  Impression, reactive neuroma with healing sesamoid fracture    Impression and  Recommendations:     This case required medical decision making of moderate complexity. The above documentation has been reviewed and is accurate and complete Lyndal Pulley, DO       Note: This dictation was prepared with Dragon dictation along with smaller phrase technology. Any transcriptional errors that result from this process are unintentional.

## 2018-02-02 ENCOUNTER — Ambulatory Visit: Payer: Self-pay

## 2018-02-02 ENCOUNTER — Encounter: Payer: Self-pay | Admitting: Family Medicine

## 2018-02-02 ENCOUNTER — Ambulatory Visit: Payer: BLUE CROSS/BLUE SHIELD | Admitting: Family Medicine

## 2018-02-02 VITALS — BP 122/78 | HR 70 | Ht 71.0 in | Wt 190.0 lb

## 2018-02-02 DIAGNOSIS — M79672 Pain in left foot: Secondary | ICD-10-CM

## 2018-02-02 DIAGNOSIS — G5762 Lesion of plantar nerve, left lower limb: Secondary | ICD-10-CM | POA: Diagnosis not present

## 2018-02-02 DIAGNOSIS — G5782 Other specified mononeuropathies of left lower limb: Secondary | ICD-10-CM | POA: Insufficient documentation

## 2018-02-02 NOTE — Patient Instructions (Signed)
Good to see you  Sorry for the neuroma.  Should get better [employer]en[employer] orthotics "total support" online would be great  Compression socks.  Exercises 3 times a week.  Try to avoid being barefoot at all times.  See me again in 4 weeks if not better and then will do injection

## 2018-02-02 NOTE — Assessment & Plan Note (Signed)
Neuroma noted, likely reactive.  Discussed icing regimen and home exercises.  Discussed which activities of doing which wants to avoid.  Gabapentin restarted and hopefully patient will respond.  Discussed icing regimen and topical anti-inflammatories.  Continue the once weekly vitamin D.  Follow-up again in 4 to 6 weeks

## 2018-02-12 DIAGNOSIS — R300 Dysuria: Secondary | ICD-10-CM | POA: Diagnosis not present

## 2018-02-12 DIAGNOSIS — Z3009 Encounter for other general counseling and advice on contraception: Secondary | ICD-10-CM | POA: Diagnosis not present

## 2018-02-12 DIAGNOSIS — N5201 Erectile dysfunction due to arterial insufficiency: Secondary | ICD-10-CM | POA: Diagnosis not present

## 2018-02-23 ENCOUNTER — Other Ambulatory Visit: Payer: Self-pay | Admitting: Family Medicine

## 2018-02-26 ENCOUNTER — Other Ambulatory Visit: Payer: Self-pay

## 2018-02-26 ENCOUNTER — Encounter: Payer: Self-pay | Admitting: Family Medicine

## 2018-02-26 ENCOUNTER — Ambulatory Visit: Payer: BLUE CROSS/BLUE SHIELD | Admitting: Family Medicine

## 2018-02-26 DIAGNOSIS — Z0000 Encounter for general adult medical examination without abnormal findings: Secondary | ICD-10-CM | POA: Diagnosis not present

## 2018-02-26 DIAGNOSIS — G43909 Migraine, unspecified, not intractable, without status migrainosus: Secondary | ICD-10-CM

## 2018-02-26 DIAGNOSIS — E7800 Pure hypercholesterolemia, unspecified: Secondary | ICD-10-CM | POA: Diagnosis not present

## 2018-02-26 DIAGNOSIS — N529 Male erectile dysfunction, unspecified: Secondary | ICD-10-CM | POA: Diagnosis not present

## 2018-02-26 MED ORDER — ROSUVASTATIN CALCIUM 10 MG PO TABS: 10.0000 mg | ORAL_TABLET | Freq: Every day | ORAL | 12 refills | Status: DC

## 2018-02-26 NOTE — Patient Instructions (Signed)
Because of your family history, it is important to really control your cholesterol with a statin.  I prescribed crestor/rosuvastatin.  I hope you don't have any problems. I will call with lab test results. The only other thing your are due for is the new shingles vaccine, Shingrix.  Let me know if/when you want me to send a prescription to the pharmacy.

## 2018-02-27 LAB — CMP14+EGFR
A/G RATIO: 2 (ref 1.2–2.2)
ALBUMIN: 4.5 g/dL (ref 3.8–4.8)
ALK PHOS: 67 IU/L (ref 39–117)
ALT: 25 IU/L (ref 0–44)
AST: 23 IU/L (ref 0–40)
BILIRUBIN TOTAL: 0.6 mg/dL (ref 0.0–1.2)
BUN / CREAT RATIO: 20 (ref 10–24)
BUN: 19 mg/dL (ref 8–27)
CO2: 23 mmol/L (ref 20–29)
Calcium: 9.6 mg/dL (ref 8.6–10.2)
Chloride: 100 mmol/L (ref 96–106)
Creatinine, Ser: 0.96 mg/dL (ref 0.76–1.27)
GFR calc non Af Amer: 84 mL/min/{1.73_m2} (ref >59)
GFR, EST AFRICAN AMERICAN: 98 mL/min/{1.73_m2} (ref >59)
Globulin, Total: 2.3 g/dL (ref 1.5–4.5)
Glucose: 92 mg/dL (ref 65–99)
POTASSIUM: 4.6 mmol/L (ref 3.5–5.2)
Sodium: 140 mmol/L (ref 134–144)
TOTAL PROTEIN: 6.8 g/dL (ref 6.0–8.5)

## 2018-02-27 LAB — LIPID PANEL
CHOL/HDL RATIO: 3.2 ratio (ref 0.0–5.0)
Cholesterol, Total: 207 mg/dL — ABNORMAL HIGH (ref 100–199)
HDL: 64 mg/dL (ref >39)
LDL Calculated: 121 mg/dL — ABNORMAL HIGH (ref 0–99)
TRIGLYCERIDES: 109 mg/dL (ref 0–149)
VLDL CHOLESTEROL CAL: 22 mg/dL (ref 5–40)

## 2018-02-27 LAB — TSH: TSH: 0.925 u[IU]/mL (ref 0.450–4.500)

## 2018-02-27 NOTE — Progress Notes (Deleted)
Established Patient Office Visit  Subjective:  Patient ID: Harry [facility], Harry Nash    DOB: 18-Jun-1955  Age: 63 y.o. MRN: [id number]  CC:  Chief Complaint  Patient presents with  . Annual Exam    HPI Harry Nash presents for ***  Past Medical History:  Diagnosis Date  . Depression   . Headache(784.0)   . Kidney stones     History reviewed. No pertinent surgical history.  Family History  Problem Relation Age of Onset  . Heart disease Mother   . Heart disease Father   . Hypertension Father     Social History   Socioeconomic History  . Marital status: Married    Spouse name: Not on file  . Number of children: Not on file  . Years of education: Not on file  . Highest education level: Not on file  Occupational History  . Not on file  Social Needs  . Financial resource strain: Not on file  . Food insecurity:    Worry: Not on file    Inability: Not on file  . Transportation needs:    Medical: Not on file    Non-medical: Not on file  Tobacco Use  . Smoking status: Former Smoker    Last attempt to quit: 02/01/2003    Years since quitting: 15.0  . Smokeless tobacco: Never Used  Substance and Sexual Activity  . Alcohol use: Yes    Alcohol/week: 1.0 standard drinks    Types: 1 Cans of beer per week    Comment: moderate  . Drug use: No  . Sexual activity: Yes  Lifestyle  . Physical activity:    Days per week: Not on file    Minutes per session: Not on file  . Stress: Not on file  Relationships  . Social connections:    Talks on phone: Not on file    Gets together: Not on file    Attends religious service: Not on file    Active member of club or organization: Not on file    Attends meetings of clubs or organizations: Not on file    Relationship status: Not on file  . Intimate partner violence:    Fear of current or ex partner: Not on file    Emotionally abused: Not on file    Physically abused: Not on file    Forced sexual activity: Not on file    Other Topics Concern  . Not on file  Social History Narrative  . Not on file    Outpatient Medications Prior to Visit  Medication Sig Dispense Refill  . LORazepam (ATIVAN) 0.5 MG tablet TAKE ONE TABLET TWICE A DAY AS NEEDED FOR ANXIETY-MUST LAST 30 DAYS. 30 tablet 1  . aspirin 81 MG tablet Take 1 tablet (81 mg total) by mouth daily. 30 tablet   . naproxen sodium (ANAPROX) 220 MG tablet Take 220-440 mg by mouth 2 (two) times daily with a meal.    . sildenafil (VIAGRA) 100 MG tablet TAKE 1/2 TO 1 TABLET BY MOUTH DAILY AS NEEDED FOR ERECTILE DYSFUNCTION. 5 tablet 6  . gabapentin (NEURONTIN) 100 MG capsule Take 2 capsules (200 mg total) by mouth at bedtime. 60 capsule 3  . ibuprofen (ADVIL,MOTRIN) 200 MG tablet Take 400-600 mg by mouth every 6 (six) hours as needed (pain). Reported on 05/29/2015    . rizatriptan (MAXALT) 10 MG tablet TAKE 1 TAB AT ONSET OF SYMPTOMS, MAY REPEAT IN 2 HOURS.DO NOT EXCEED 2 TABS/24 HRS. 9  tablet 0  . tiZANidine (ZANAFLEX) 4 MG tablet Take 1 tablet (4 mg total) by mouth every 6 (six) hours as needed for muscle spasms. 30 tablet 0  . Vitamin D, Ergocalciferol, (DRISDOL) 50000 units CAPS capsule Take 1 capsule (50,000 Units total) by mouth every 7 (seven) days. 12 capsule 0   No facility-administered medications prior to visit.     Allergies  Allergen Reactions  . Gabapentin Other (See Comments)    Clouded mentation    ROS Review of Systems    Objective:    Physical Exam  BP 110/72   Pulse 80   Temp 98.7 F (37.1 C) (Oral)   Ht '5\' 11"'$  (1.803 m)   Wt 184 lb 12.8 oz (83.8 kg)   SpO2 98%   BMI 25.77 kg/m  Wt Readings from Last 3 Encounters:  02/26/18 184 lb 12.8 oz (83.8 kg)  02/02/18 190 lb (86.2 kg)  01/11/18 186 lb (84.4 kg)     There are no preventive care reminders to display for this patient.  There are no preventive care reminders to display for this patient.  Lab Results  Component Value Date   TSH 0.925 02/26/2018   Lab Results   Component Value Date   WBC 13.6 (H) 10/28/2016   HGB 14.9 10/28/2016   HCT 44.5 10/28/2016   MCV 87.4 10/28/2016   PLT 217 10/28/2016   Lab Results  Component Value Date   NA 140 02/26/2018   K 4.6 02/26/2018   CO2 23 02/26/2018   GLUCOSE 92 02/26/2018   BUN 19 02/26/2018   CREATININE 0.96 02/26/2018   BILITOT 0.6 02/26/2018   ALKPHOS 67 02/26/2018   AST 23 02/26/2018   ALT 25 02/26/2018   PROT 6.8 02/26/2018   ALBUMIN 4.5 02/26/2018   CALCIUM 9.6 02/26/2018   ANIONGAP 6 10/28/2016   Lab Results  Component Value Date   CHOL 207 (H) 02/26/2018   Lab Results  Component Value Date   HDL 64 02/26/2018   Lab Results  Component Value Date   LDLCALC 121 (H) 02/26/2018   Lab Results  Component Value Date   TRIG 109 02/26/2018   Lab Results  Component Value Date   CHOLHDL 3.2 02/26/2018   No results found for: HGBA1C    Assessment & Plan:   Problem List Items Addressed This Visit    Routine general medical examination at a health care facility    Healthy man without lifestyle risk factors.  Main concern is his very strong FHx of CAD      Migraine headache    Continue tryptan.      Relevant Medications   rosuvastatin (CRESTOR) 10 MG tablet   HYPERCHOLESTEROLEMIA    10 y ASCVD risk is only 7% but that does not take into account his strong FHx.  Will start low dose crestor.      Relevant Medications   rosuvastatin (CRESTOR) 10 MG tablet   Other Relevant Orders   Lipid panel (Completed)   CMP14+EGFR (Completed)   TSH (Completed)   Erectile dysfunction    Could be a marker of cardiovascular disease.  Regardless, my plan is aspirin, statin and viagra.         Meds ordered this encounter  Medications  . rosuvastatin (CRESTOR) 10 MG tablet    Sig: Take 1 tablet (10 mg total) by mouth daily.    Dispense:  30 tablet    Refill:  12    Follow-up: Return in about 1  year (around 02/27/2019).    [name], MD

## 2018-02-27 NOTE — Assessment & Plan Note (Signed)
Continue tryptan.

## 2018-02-27 NOTE — Progress Notes (Signed)
Established Patient Office Visit  Subjective:  Patient ID: Harry Nash, male    DOB: [birth year]  Age: 63 y.o. MRN: [id number]  CC:  Chief Complaint  Patient presents with  . Annual Exam    HPI Harry Nash presents for annual exam.  If this note looks odd or is duplicated, it is because Epic shut down mid note. Annual exam 1. Healthy male with good diet and exercise.  Ex smoker quit ~15 years ago.  No at risk behaviors. 2. Erectile dysfunction.  Responds well to viagra.  Was at uro for a bought of prostatitis and uro did not recommend further [facility]. 3. High cholesterol.  Concerning, very strong FHx of CAD.  Father died of MI mid 20s.  Younger sister has CAD.  Needs labs. 4. Life stressors are normal except does have a 48 yo child - a bit uusual for a 43 yold.   Past Medical History:  Diagnosis Date  . Depression   . Headache(784.0)   . Kidney stones     History reviewed. No pertinent surgical history.  Family History  Problem Relation Age of Onset  . Heart disease Mother   . Heart disease Father   . Hypertension Father     Social History   Socioeconomic History  . Marital status: Married    Spouse name: Not on file  . Number of children: Not on file  . Years of education: Not on file  . Highest education level: Not on file  Occupational History  . Not on file  Social Needs  . Financial resource strain: Not on file  . Food insecurity:    Worry: Not on file    Inability: Not on file  . Transportation needs:    Medical: Not on file    Non-medical: Not on file  Tobacco Use  . Smoking status: Former Smoker    Last attempt to quit: 02/01/2003    Years since quitting: 15.0  . Smokeless tobacco: Never Used  Substance and Sexual Activity  . Alcohol use: Yes    Alcohol/week: 1.0 standard drinks    Types: 1 Cans of beer per week    Comment: moderate  . Drug use: No  . Sexual activity: Yes  Lifestyle  . Physical activity:    Days per week: Not on file   Minutes per session: Not on file  . Stress: Not on file  Relationships  . Social connections:    Talks on phone: Not on file    Gets together: Not on file    Attends religious service: Not on file    Active member of club or organization: Not on file    Attends meetings of clubs or organizations: Not on file    Relationship status: Not on file  . Intimate partner violence:    Fear of current or ex partner: Not on file    Emotionally abused: Not on file    Physically abused: Not on file    Forced sexual activity: Not on file  Other Topics Concern  . Not on file  Social History Narrative  . Not on file    Outpatient Medications Prior to Visit  Medication Sig Dispense Refill  . LORazepam (ATIVAN) 0.5 MG tablet TAKE ONE TABLET TWICE A DAY AS NEEDED FOR ANXIETY-MUST LAST 30 DAYS. 30 tablet 1  . aspirin 81 MG tablet Take 1 tablet (81 mg total) by mouth daily. 30 tablet   . naproxen sodium (ANAPROX) 220 MG  tablet Take 220-440 mg by mouth 2 (two) times daily with a meal.    . sildenafil (VIAGRA) 100 MG tablet TAKE 1/2 TO 1 TABLET BY MOUTH DAILY AS NEEDED FOR ERECTILE DYSFUNCTION. 5 tablet 6  . gabapentin (NEURONTIN) 100 MG capsule Take 2 capsules (200 mg total) by mouth at bedtime. 60 capsule 3  . ibuprofen (ADVIL,MOTRIN) 200 MG tablet Take 400-600 mg by mouth every 6 (six) hours as needed (pain). Reported on 05/29/2015    . rizatriptan (MAXALT) 10 MG tablet TAKE 1 TAB AT ONSET OF SYMPTOMS, MAY REPEAT IN 2 HOURS.DO NOT EXCEED 2 TABS/24 HRS. 9 tablet 0  . tiZANidine (ZANAFLEX) 4 MG tablet Take 1 tablet (4 mg total) by mouth every 6 (six) hours as needed for muscle spasms. 30 tablet 0  . Vitamin D, Ergocalciferol, (DRISDOL) 50000 units CAPS capsule Take 1 capsule (50,000 Units total) by mouth every 7 (seven) days. 12 capsule 0   No facility-administered medications prior to visit.     Allergies  Allergen Reactions  . Gabapentin Other (See Comments)    Clouded mentation    ROS Review  of Systems   Denies CP, DOE, abd pain, weakness, numbness, and worrisome skin lesions. Denies change in bowel, bladder, appetite, or weight. Objective:    Physical Exam  BP 110/72   Pulse 80   Temp 98.7 F (37.1 C) (Oral)   Ht '5\' 11"'$  (1.803 m)   Wt 184 lb 12.8 oz (83.8 kg)   SpO2 98%   BMI 25.77 kg/m  Wt Readings from Last 3 Encounters:  02/26/18 184 lb 12.8 oz (83.8 kg)  02/02/18 190 lb (86.2 kg)  01/11/18 186 lb (84.4 kg)  HEENT normal Neck supple without masses Lungs clear Cardiac RRR without m or g Abd benign Ext no edema Neuro normal gait, motor sensory and mentation.   There are no preventive care reminders to display for this patient.  There are no preventive care reminders to display for this patient.  Lab Results  Component Value Date   TSH 0.925 02/26/2018   Lab Results  Component Value Date   WBC 13.6 (H) 10/28/2016   HGB 14.9 10/28/2016   HCT 44.5 10/28/2016   MCV 87.4 10/28/2016   PLT 217 10/28/2016   Lab Results  Component Value Date   NA 140 02/26/2018   K 4.6 02/26/2018   CO2 23 02/26/2018   GLUCOSE 92 02/26/2018   BUN 19 02/26/2018   CREATININE 0.96 02/26/2018   BILITOT 0.6 02/26/2018   ALKPHOS 67 02/26/2018   AST 23 02/26/2018   ALT 25 02/26/2018   PROT 6.8 02/26/2018   ALBUMIN 4.5 02/26/2018   CALCIUM 9.6 02/26/2018   ANIONGAP 6 10/28/2016   Lab Results  Component Value Date   CHOL 207 (H) 02/26/2018   Lab Results  Component Value Date   HDL 64 02/26/2018   Lab Results  Component Value Date   LDLCALC 121 (H) 02/26/2018   Lab Results  Component Value Date   TRIG 109 02/26/2018   Lab Results  Component Value Date   CHOLHDL 3.2 02/26/2018   No results found for: HGBA1C    Assessment & Plan:   Problem List Items Addressed This Visit    Routine general medical examination at a health care facility    Healthy man without lifestyle risk factors.  Main concern is his very strong FHx of CAD      Migraine headache      Continue tryptan.  Relevant Medications   rosuvastatin (CRESTOR) 10 MG tablet   HYPERCHOLESTEROLEMIA    10 y ASCVD risk is only 7% but that does not take into account his strong FHx.  Will start low dose crestor.      Relevant Medications   rosuvastatin (CRESTOR) 10 MG tablet   Other Relevant Orders   Lipid panel (Completed)   CMP14+EGFR (Completed)   TSH (Completed)   Erectile dysfunction    Could be a marker of cardiovascular disease.  Regardless, my plan is aspirin, statin and viagra.         Meds ordered this encounter  Medications  . rosuvastatin (CRESTOR) 10 MG tablet    Sig: Take 1 tablet (10 mg total) by mouth daily.    Dispense:  30 tablet    Refill:  12    Follow-up: Return in about 1 year (around 02/27/2019).    [name], MD

## 2018-02-27 NOTE — Assessment & Plan Note (Signed)
60 y ASCVD risk is only 7% but that does not take into account his strong FHx.  Will start low dose crestor.

## 2018-02-27 NOTE — Assessment & Plan Note (Signed)
Healthy man without lifestyle risk factors.  Main concern is his very strong FHx of CAD

## 2018-02-27 NOTE — Assessment & Plan Note (Signed)
Could be a marker of cardiovascular disease.  Regardless, my plan is aspirin, statin and viagra.

## 2018-03-03 NOTE — Progress Notes (Signed)
[facility] [address] [place], [place] [address] Phone: [phone] Subjective:    I [name] am serving as a Education administrator for [name].    CC: Foot pain follow-up  [id number]    02/02/2018 Editor: Lyndal Pulley, DO (Physician)    Neuroma noted, likely reactive.  Discussed icing regimen and home exercises.  Discussed which activities of doing which wants to avoid.  Gabapentin restarted and hopefully patient will respond.  Discussed icing regimen and topical anti-inflammatories.  Continue the once weekly vitamin D.       Updated 03/05/2018  Arbie Blankley Markoff is a 63 y.o. male coming in with complaint of left foot pain.  Patient had what appeared to be a sesamoid fracture that was healing.  Unfortunately also had a reactive neuroma.  Patient states that his foot has made some progress. With normal use he still has issues with pain.  Patient is unable to do any has teaching secondary to the discomfort and pain at this moment.     Past Medical History:  Diagnosis Date  . Depression   . Headache(784.0)   . Kidney stones    No past surgical history on file. Social History   Socioeconomic History  . Marital status: Married    Spouse name: Not on file  . Number of children: Not on file  . Years of education: Not on file  . Highest education level: Not on file  Occupational History  . Not on file  Social Needs  . Financial resource strain: Not on file  . Food insecurity:    Worry: Not on file    Inability: Not on file  . Transportation needs:    Medical: Not on file    Non-medical: Not on file  Tobacco Use  . Smoking status: Former Smoker    Last attempt to quit: 02/01/2003    Years since quitting: 15.0  . Smokeless tobacco: Never Used  Substance and Sexual Activity  . Alcohol use: Yes    Alcohol/week: 1.0 standard drinks    Types: 1 Cans of beer per week    Comment: moderate  . Drug use: No  . Sexual activity: Yes    Lifestyle  . Physical activity:    Days per week: Not on file    Minutes per session: Not on file  . Stress: Not on file  Relationships  . Social connections:    Talks on phone: Not on file    Gets together: Not on file    Attends religious service: Not on file    Active member of club or organization: Not on file    Attends meetings of clubs or organizations: Not on file    Relationship status: Not on file  Other Topics Concern  . Not on file  Social History Narrative  . Not on file   Allergies  Allergen Reactions  . Gabapentin Other (See Comments)    Clouded mentation   Family History  Problem Relation Age of Onset  . Heart disease Mother   . Heart disease Father   . Hypertension Father      Current Outpatient Medications (Cardiovascular):  .  rosuvastatin (CRESTOR) 10 MG tablet, Take 1 tablet (10 mg total) by mouth daily. .  sildenafil (VIAGRA) 100 MG tablet, TAKE 1/2 TO 1 TABLET BY MOUTH DAILY AS NEEDED FOR ERECTILE DYSFUNCTION.   Current Outpatient Medications (Analgesics):  .  aspirin 81 MG tablet, Take 1 tablet (81  mg total) by mouth daily. .  naproxen sodium (ANAPROX) 220 MG tablet, Take 220-440 mg by mouth 2 (two) times daily with a meal. .  rizatriptan (MAXALT) 10 MG tablet, TAKE 1 TAB AT ONSET OF SYMPTOMS, MAY REPEAT IN 2 HOURS.DO NOT EXCEED 2 TABS/24 HRS.   Current Outpatient Medications (Other):  Marland Kitchen  LORazepam (ATIVAN) 0.5 MG tablet, TAKE ONE TABLET TWICE A DAY AS NEEDED FOR ANXIETY-MUST LAST 30 DAYS.    Past medical history, social, surgical and family history all reviewed in electronic medical record.  No pertanent information unless stated regarding to the chief complaint.   Review of Systems:  No headache, visual changes, nausea, vomiting, diarrhea, constipation, dizziness, abdominal pain, skin rash, fevers, chills, night sweats, weight loss, swollen lymph nodes, body aches, joint swelling, chest pain, shortness of breath, mood changes.  Positive  muscle aches  Objective  Blood pressure 130/84, pulse 81, height 5\' 11"  (1.803 m), weight 186 lb (84.4 kg), SpO2 97 %.   General: No apparent distress alert and oriented x3 mood and affect normal, dressed appropriately.  HEENT: Pupils equal, extraocular movements intact  Respiratory: Patient's speak in full sentences and does not appear short of breath  Cardiovascular: No lower extremity edema, non tender, no erythema  Skin: Warm dry intact with no signs of infection or rash on extremities or on axial skeleton.  Abdomen: Soft nontender  Neuro: Cranial nerves II through XII are intact, neurovascularly intact in all extremities with 2+ DTRs and 2+ pulses.  Lymph: No lymphadenopathy of posterior or anterior cervical chain or axillae bilaterally.  Gait mild antalgic  MSK:  Non tender with full range of motion and good stability and symmetric strength and tone of shoulders, elbows, wrist, hip, knee and ankles bilaterally.  Mild arthritic changes of multiple joints  Left foot exam shows the patient does have splaying between the second and third toes.  Positive squeeze test.  Pain over the interstitial space in the second and third  Procedure: Real-time Ultrasound Guided Injection of second and third inner metatarsal space neuroma Device: GE Logiq Q7 Ultrasound guided injection is preferred based studies that show increased duration, increased effect, greater accuracy, decreased procedural pain, increased response rate, and decreased cost with ultrasound guided versus blind injection.  Verbal informed consent obtained.  Time-out conducted.  Noted no overlying erythema, induration, or other signs of local infection.  Skin prepped in a sterile fashion.  Local anesthesia: Topical Ethyl chloride.  With sterile technique and under real time ultrasound guidance: With a 25-gauge half inch needle was injected with 0.5 cc of 0.5% Marcaine and 0.5 cc Kenalog 40 mg/mL Completed without difficulty  Pain  immediately resolved suggesting accurate placement of the medication.  Advised to call if fevers/chills, erythema, induration, drainage, or persistent bleeding.  Images permanently stored and available for review in the ultrasound unit.  Impression: Technically successful ultrasound guided injection.    Impression and Recommendations:     This case required medical decision making of moderate complexity. The above documentation has been reviewed and is accurate and complete Lyndal Pulley, DO       Note: This dictation was prepared with Dragon dictation along with smaller phrase technology. Any transcriptional errors that result from this process are unintentional.

## 2018-03-04 ENCOUNTER — Other Ambulatory Visit: Payer: Self-pay | Admitting: Family Medicine

## 2018-03-04 DIAGNOSIS — F4323 Adjustment disorder with mixed anxiety and depressed mood: Secondary | ICD-10-CM

## 2018-03-05 ENCOUNTER — Ambulatory Visit: Payer: Self-pay

## 2018-03-05 ENCOUNTER — Ambulatory Visit: Payer: BLUE CROSS/BLUE SHIELD | Admitting: Family Medicine

## 2018-03-05 ENCOUNTER — Encounter: Payer: Self-pay | Admitting: Family Medicine

## 2018-03-05 VITALS — BP 130/84 | HR 81 | Ht 71.0 in | Wt 186.0 lb

## 2018-03-05 DIAGNOSIS — G5762 Lesion of plantar nerve, left lower limb: Secondary | ICD-10-CM

## 2018-03-05 DIAGNOSIS — M79672 Pain in left foot: Secondary | ICD-10-CM

## 2018-03-05 DIAGNOSIS — G5782 Other specified mononeuropathies of left lower limb: Secondary | ICD-10-CM

## 2018-03-05 NOTE — Patient Instructions (Signed)
Good to see you  Ice is your friend Wart remover cream on the bottom of the foot.  [email or web address] get a metatarsal pad or metatarsal cookie.Then call us when you get it and we will see if we can put it in your shoe. Ask for Kana Injected the nueroma today and will take some time See me again in 4 weeks

## 2018-03-05 NOTE — Assessment & Plan Note (Signed)
Patient has had a neuroma between third toes and injected today.  Tolerated the procedure well.  Discussed icing regimen and home exercises.  Discussed which activities to do which was to avoid.  Discussed proper shoes.  Patient will get metatarsal pads and will make changes to his dance shoes added later date.  Follow-up with me again in 4 weeks

## 2018-03-26 ENCOUNTER — Encounter: Payer: Self-pay | Admitting: Family Medicine

## 2018-04-03 ENCOUNTER — Ambulatory Visit: Payer: BLUE CROSS/BLUE SHIELD | Admitting: Family Medicine

## 2018-04-03 ENCOUNTER — Ambulatory Visit: Payer: Self-pay

## 2018-04-03 VITALS — BP 120/74 | HR 81 | Ht 71.0 in | Wt 186.0 lb

## 2018-04-03 DIAGNOSIS — M25511 Pain in right shoulder: Secondary | ICD-10-CM

## 2018-04-03 DIAGNOSIS — S46219A Strain of muscle, fascia and tendon of other parts of biceps, unspecified arm, initial encounter: Secondary | ICD-10-CM | POA: Insufficient documentation

## 2018-04-03 NOTE — Assessment & Plan Note (Signed)
Bicep tendon tear partial, discussed compression, home exercises, work with Product/process development scientist.  Discussed topical anti-inflammatories.  Believe that patient's rotator cuff appears to be mostly intact.  Should not be a long-term issue.  Follow-up again in 3 to 4 weeks.

## 2018-04-03 NOTE — Patient Instructions (Signed)
Good to see you  Partial bicep tendon tear Ice is yoru friend Keep hands within your peripheral vision  Compression sleeve to the arm with activity  See me again in 4 weeks if not better  Foot is the foot and lets watch it

## 2018-04-03 NOTE — Progress Notes (Signed)
[name] [address] [place], [place] [address] Phone: [phone] Subjective:   I [name] am serving as a Education administrator for [name].   CC: Foot pain and new onset right shoulder pain  SWH:[id number]    03/05/2018 Patient has had a neuroma between third toes and injected today.  Tolerated the procedure well.  Discussed icing regimen and home exercises.  Discussed which activities to do which was to avoid.  Discussed proper shoes.  Patient will get metatarsal pads and will make changes to his dance shoes added later date.  Follow-up with me again in 4 weeks  Updated 04/03/2018 Harry Nash is a 63 y.o. male coming in with complaint of left foot pain. Did have some relief from the injection. Does have pain after 1 hour of activity. Pain seems to be unchanged since last visit.   Right shoulder has been bothering him for 1 week. Feels like he hurt his shoulder when putting his child in car seat. Pain in anterior shoulder.  Patient states that he was moving a car seat.  States it is a dull, throbbing aching pain.  2 days was unable to move the arm but now seems to be increasing a little bit.  Denies any numbness or tingling.  Maybe some mild weakness and has not been as active.       Past Medical History:  Diagnosis Date  . Depression   . Headache(784.0)   . Kidney stones    No past surgical history on file. Social History   Socioeconomic History  . Marital status: Married    Spouse name: Not on file  . Number of children: Not on file  . Years of education: Not on file  . Highest education level: Not on file  Occupational History  . Not on file  Social Needs  . Financial resource strain: Not on file  . Food insecurity:    Worry: Not on file    Inability: Not on file  . Transportation needs:    Medical: Not on file    Non-medical: Not on file  Tobacco Use  . Smoking status: Former Smoker    Last attempt to quit: 02/01/2003   Years since quitting: 15.1  . Smokeless tobacco: Never Used  Substance and Sexual Activity  . Alcohol use: Yes    Alcohol/week: 1.0 standard drinks    Types: 1 Cans of beer per week    Comment: moderate  . Drug use: No  . Sexual activity: Yes  Lifestyle  . Physical activity:    Days per week: Not on file    Minutes per session: Not on file  . Stress: Not on file  Relationships  . Social connections:    Talks on phone: Not on file    Gets together: Not on file    Attends religious service: Not on file    Active member of club or organization: Not on file    Attends meetings of clubs or organizations: Not on file    Relationship status: Not on file  Other Topics Concern  . Not on file  Social History Narrative  . Not on file   Allergies  Allergen Reactions  . Gabapentin Other (See Comments)    Clouded mentation   Family History  Problem Relation Age of Onset  . Heart disease Mother   . Heart disease Father   . Hypertension Father      Current Outpatient Medications (Cardiovascular):  .  rosuvastatin (CRESTOR) 10 MG tablet, Take 1 tablet (10 mg total) by mouth daily. .  sildenafil (VIAGRA) 100 MG tablet, TAKE 1/2 TO 1 TABLET BY MOUTH DAILY AS NEEDED FOR ERECTILE DYSFUNCTION.   Current Outpatient Medications (Analgesics):  .  aspirin 81 MG tablet, Take 1 tablet (81 mg total) by mouth daily. .  naproxen sodium (ANAPROX) 220 MG tablet, Take 220-440 mg by mouth 2 (two) times daily with a meal. .  rizatriptan (MAXALT) 10 MG tablet, TAKE 1 TAB AT ONSET OF SYMPTOMS, MAY REPEAT IN 2 HOURS.DO NOT EXCEED 2 TABS/24 HRS.   Current Outpatient Medications (Other):  Marland Kitchen  LORazepam (ATIVAN) 0.5 MG tablet, TAKE ONE TABLET TWICE A DAY AS NEEDED FOR ANXIETY-MUST LAST 30 DAYS.    Past medical history, social, surgical and family history all reviewed in electronic medical record.  No pertanent information unless stated regarding to the chief complaint.   Review of Systems:  No  headache, visual changes, nausea, vomiting, diarrhea, constipation, dizziness, abdominal pain, skin rash, fevers, chills, night sweats, weight loss, swollen lymph nodes, body aches, joint swelling, chest pain, shortness of breath, mood changes.  Positive muscle aches  Objective  Blood pressure 120/74, pulse 81, height 5\' 11"  (1.803 m), weight 186 lb (84.4 kg), SpO2 96 %. Systems examined below as of    General: No apparent distress alert and oriented x3 mood and affect normal, dressed appropriately.  HEENT: Pupils equal, extraocular movements intact  Respiratory: Patient's speak in full sentences and does not appear short of breath  Cardiovascular: No lower extremity edema, non tender, no erythema  Skin: Warm dry intact with no signs of infection or rash on extremities or on axial skeleton.  Abdomen: Soft nontender  Neuro: Cranial nerves II through XII are intact, neurovascularly intact in all extremities with 2+ DTRs and 2+ pulses.  Lymph: No lymphadenopathy of posterior or anterior cervical chain or axillae bilaterally.  Gait normal with good balance and coordination.  MSK:  Non tender with full range of motion and good stability and symmetric strength and tone of  elbows, wrist, hip, knee and ankles bilaterally.  Shoulder: Right Inspection reveals no abnormalities, atrophy or asymmetry. Palpation is normal with no tenderness over AC joint or bicipital groove. ROM is full in all planes. Rotator cuff strength normal throughout. Mild impugnment  Speeds and Yergason's tests positive . No labral pathology noted with negative Obrien's, negative clunk and good stability. Normal scapular function observed. No painful arc and no drop arm sign. No apprehension sign Contralateral shoulder unremarkable  MSK US performed of: right shoulder This study was ordered, performed, and interpreted by Charlann Boxer D.O.  Shoulder:   Supraspinatus:  Appears normal on long and transverse views, no bursal  bulge seen with shoulder abduction on impingement view. Infraspinatus:  Appears normal on long and transverse views. Subscapularis:  Appears normal on long and transverse views. AC joint:  Capsule distended Glenohumeral Joint:  Appears normal without effusion. Glenoid Labrum:  Intact without visualized tears. Biceps Tendon: Biceps tendon hypoechoic changes noted.  Possible small partial tear of the superficial aspect. No increased power doppler signal. Pression: Acute partial biceps tendon tear  [id number]; 15 additional minutes spent for Therapeutic exercises as stated in above notes.  This included exercises focusing on stretching, strengthening, with significant focus on eccentric aspects.   Long term goals include an improvement in range of motion, strength, endurance as well as avoiding reinjury. Patient's frequency would include in 1-2 times a day, 3-5 times a  week for a duration of 6-12 weeks. Shoulder Exercises that included:  Basic scapular stabilization to include adduction and depression of scapula Scaption, focusing on proper movement and good control Internal and External rotation utilizing a theraband, with elbow tucked at side entire time Rows with theraband   Proper technique shown and discussed handout in great detail with ATC.  All questions were discussed and answered.      Impression and Recommendations:     This case required medical decision making of moderate complexity. The above documentation has been reviewed and is accurate and complete Lyndal Pulley, DO       Note: This dictation was prepared with Dragon dictation along with smaller phrase technology. Any transcriptional errors that result from this process are unintentional.

## 2018-05-14 ENCOUNTER — Other Ambulatory Visit: Payer: Self-pay | Admitting: Family Medicine

## 2018-05-14 DIAGNOSIS — F4323 Adjustment disorder with mixed anxiety and depressed mood: Secondary | ICD-10-CM

## 2018-10-11 ENCOUNTER — Encounter: Payer: Self-pay | Admitting: Family Medicine

## 2018-10-23 ENCOUNTER — Other Ambulatory Visit: Payer: Self-pay

## 2018-10-23 DIAGNOSIS — Z20822 Contact with and (suspected) exposure to covid-19: Secondary | ICD-10-CM

## 2018-10-23 DIAGNOSIS — R6889 Other general symptoms and signs: Secondary | ICD-10-CM | POA: Diagnosis not present

## 2018-10-24 ENCOUNTER — Telehealth: Payer: Self-pay

## 2018-10-24 LAB — NOVEL CORONAVIRUS, NAA: SARS-CoV-2, NAA: NOT DETECTED

## 2018-10-24 NOTE — Telephone Encounter (Signed)
Called and scheduled patient appointment for October 2nd

## 2018-11-02 ENCOUNTER — Ambulatory Visit: Payer: Self-pay

## 2018-11-02 ENCOUNTER — Ambulatory Visit (INDEPENDENT_AMBULATORY_CARE_PROVIDER_SITE_OTHER): Payer: BLUE CROSS/BLUE SHIELD | Admitting: Family Medicine

## 2018-11-02 ENCOUNTER — Other Ambulatory Visit: Payer: Self-pay

## 2018-11-02 VITALS — BP 132/82 | HR 79 | Ht 71.0 in

## 2018-11-02 DIAGNOSIS — G8929 Other chronic pain: Secondary | ICD-10-CM

## 2018-11-02 DIAGNOSIS — S46219A Strain of muscle, fascia and tendon of other parts of biceps, unspecified arm, initial encounter: Secondary | ICD-10-CM

## 2018-11-02 DIAGNOSIS — M25511 Pain in right shoulder: Secondary | ICD-10-CM | POA: Diagnosis not present

## 2018-11-02 DIAGNOSIS — S8011XA Contusion of right lower leg, initial encounter: Secondary | ICD-10-CM

## 2018-11-02 NOTE — Progress Notes (Signed)
[name] [place], [place] [address] Phone: [phone] Subjective:   [name] No, am serving as a scribe for [name].    CC: Shoulder pain follow-up, new leg pain  [id number]   04/03/2018 Bicep tendon tear partial, discussed compression, home exercises, work with Product/process development scientist.  Discussed topical anti-inflammatories.  Believe that patient's rotator cuff appears to be mostly intact.  Should not be a long-term issue.  Follow-up again in 3 to 4 weeks.   Update 11/02/2018 Miyon Voight Cohenour is a 63 y.o. male coming in with complaint of right shoulder pain. Patient states that his arm is not better. Feels pain over bicep and into deltoid at rest and with movement. Having a hard time sleeping due to pain.  Patient wants to know what is going on with the shoulder.  Feels like he has not made much progress since last time.  Has not followed up secondary to the coronavirus.  Patient got hit by baseball on medial tibia that his son hit. Has knot on the distal tibia. Does have swelling, echmyosis and pain. Has been able to be active despite his pain.  Patient states it does seem to have been better slowly but continues to have a long time.    Past Medical History:  Diagnosis Date  . Depression   . Headache(784.0)   . Kidney stones    No past surgical history on file. Social History   Socioeconomic History  . Marital status: Married    Spouse name: Not on file  . Number of children: Not on file  . Years of education: Not on file  . Highest education level: Not on file  Occupational History  . Not on file  Social Needs  . Financial resource strain: Not on file  . Food insecurity    Worry: Not on file    Inability: Not on file  . Transportation needs    Medical: Not on file    Non-medical: Not on file  Tobacco Use  . Smoking status: Former Smoker    Quit date: 02/01/2003    Years since quitting: 15.7  . Smokeless  tobacco: Never Used  Substance and Sexual Activity  . Alcohol use: Yes    Alcohol/week: 1.0 standard drinks    Types: 1 Cans of beer per week    Comment: moderate  . Drug use: No  . Sexual activity: Yes  Lifestyle  . Physical activity    Days per week: Not on file    Minutes per session: Not on file  . Stress: Not on file  Relationships  . Social Herbalist on phone: Not on file    Gets together: Not on file    Attends religious service: Not on file    Active member of club or organization: Not on file    Attends meetings of clubs or organizations: Not on file    Relationship status: Not on file  Other Topics Concern  . Not on file  Social History Narrative  . Not on file   Allergies  Allergen Reactions  . Gabapentin Other (See Comments)    Clouded mentation   Family History  Problem Relation Age of Onset  . Heart disease Mother   . Heart disease Father   . Hypertension Father      Current Outpatient Medications (Cardiovascular):  .  rosuvastatin (CRESTOR) 10 MG tablet, Take 1 tablet (10 mg total) by mouth  daily. .  sildenafil (VIAGRA) 100 MG tablet, TAKE 1/2 TO 1 TABLET BY MOUTH DAILY AS NEEDED FOR ERECTILE DYSFUNCTION.   Current Outpatient Medications (Analgesics):  .  aspirin 81 MG tablet, Take 1 tablet (81 mg total) by mouth daily. .  naproxen sodium (ANAPROX) 220 MG tablet, Take 220-440 mg by mouth 2 (two) times daily with a meal. .  rizatriptan (MAXALT) 10 MG tablet, TAKE 1 TAB AT ONSET OF SYMPTOMS, MAY REPEAT IN 2 HOURS.DO NOT EXCEED 2 TABS/24 HRS.   Current Outpatient Medications (Other):  Marland Kitchen  LORazepam (ATIVAN) 0.5 MG tablet, TAKE ONE TABLET TWICE A DAY AS NEEDED FOR ANXIETY-MUST LAST 30 DAYS.    Past medical history, social, surgical and family history all reviewed in electronic medical record.  No pertanent information unless stated regarding to the chief complaint.   Review of Systems:  No headache, visual changes, nausea, vomiting,  diarrhea, constipation, dizziness, abdominal pain, skin rash, fevers, chills, night sweats, weight loss, swollen lymph nodes, body aches, joint swelling,chest pain, shortness of breath, mood changes.  Positive muscle aches  Objective  Blood pressure 132/82, pulse 79, height 5\' 11"  (1.803 m), SpO2 97 %.    General: No apparent distress alert and oriented x3 mood and affect normal, dressed appropriately.  HEENT: Pupils equal, extraocular movements intact  Respiratory: Patient's speak in full sentences and does not appear short of breath  Cardiovascular: No lower extremity edema, non tender, no erythema  Skin: Warm dry intact with no signs of infection or rash on extremities or on axial skeleton.  Abdomen: Soft nontender  Neuro: Cranial nerves II through XII are intact, neurovascularly intact in all extremities with 2+ DTRs and 2+ pulses.  Lymph: No lymphadenopathy of posterior or anterior cervical chain or axillae bilaterally.  Gait normal with good balance and coordination.  MSK:  Non tender with full range of motion and good stability and symmetric strength and tone of , elbows, wrist, hip, knee and ankles bilaterally.  Right shoulder exam shows some positive impingement noted.  Mild positive speeds test.  Negative O'Brien's.  Rotator cuff strength 4+ out of 5.  Right leg shows the patient does have a very large hematoma noted on the medial aspect of the tibia distal third of the tibia.  Tender to palpation surrounding the area.  About the size of a golf ball.  Trace bruising noted going down to the foot  Limited musculoskeletal ultrasound was performed interpreted by Lyndal Pulley  Limited ultrasound of patient's tibia and shows the patient does have a hematoma noted.  No abnormal vascularity around the area on the medial tibia area.  No cortical defect noted.  Bone contusion likely.   Impression and Recommendations:     This case required medical decision making of moderate complexity.  The above documentation has been reviewed and is accurate and complete Lyndal Pulley, DO       Note: This dictation was prepared with Dragon dictation along with smaller phrase technology. Any transcriptional errors that result from this process are unintentional.

## 2018-11-02 NOTE — Patient Instructions (Addendum)
Continue the pennsaid and arnica lotion Warm compress to the leg 10 mins 3 times a day  [facility] will call you Follow up after MRI

## 2018-11-03 ENCOUNTER — Encounter: Payer: Self-pay | Admitting: Family Medicine

## 2018-11-03 DIAGNOSIS — S8011XA Contusion of right lower leg, initial encounter: Secondary | ICD-10-CM | POA: Insufficient documentation

## 2018-11-03 NOTE — Assessment & Plan Note (Signed)
Patient had more of a bicep tendon tear.  Concern for possible labral pathology.  Discussed with patient in great length about icing regimen and home exercises.  Has failed all this in the past.  Patient feels like worsening.  We will give patient MR arthrogram to further evaluate.  Patient will consider surgical intervention.  Declined any type of injection.

## 2018-11-03 NOTE — Assessment & Plan Note (Signed)
Contusion of the right leg noted.  Discussed icing regimen and home exercises, which activities to do which wants to avoid.  Discussed heat and compression.  Would like patient know to follow-up in the next 4 weeks and if any calcific changes we will need aspiration.  Patient declined aspiration today.

## 2018-11-06 DIAGNOSIS — H5203 Hypermetropia, bilateral: Secondary | ICD-10-CM | POA: Diagnosis not present

## 2018-11-09 ENCOUNTER — Encounter: Payer: Self-pay | Admitting: Family Medicine

## 2018-11-16 ENCOUNTER — Other Ambulatory Visit: Payer: Self-pay | Admitting: *Deleted

## 2018-11-16 DIAGNOSIS — M25511 Pain in right shoulder: Secondary | ICD-10-CM

## 2018-11-20 ENCOUNTER — Other Ambulatory Visit: Payer: Self-pay

## 2018-11-20 ENCOUNTER — Ambulatory Visit (INDEPENDENT_AMBULATORY_CARE_PROVIDER_SITE_OTHER)
Admission: RE | Admit: 2018-11-20 | Discharge: 2018-11-20 | Disposition: A | Discharge status: Home / Self Care | Payer: BLUE CROSS/BLUE SHIELD | Source: Ambulatory Visit | Attending: Family Medicine | Admitting: Family Medicine

## 2018-11-20 DIAGNOSIS — M25511 Pain in right shoulder: Secondary | ICD-10-CM

## 2018-11-23 ENCOUNTER — Ambulatory Visit
Admission: RE | Admit: 2018-11-23 | Discharge: 2018-11-23 | Disposition: A | Discharge status: Home / Self Care | Payer: BLUE CROSS/BLUE SHIELD | Source: Ambulatory Visit | Attending: Family Medicine | Admitting: Family Medicine

## 2018-11-23 ENCOUNTER — Other Ambulatory Visit: Payer: Self-pay

## 2018-11-23 DIAGNOSIS — G8929 Other chronic pain: Secondary | ICD-10-CM

## 2018-11-23 DIAGNOSIS — M25511 Pain in right shoulder: Secondary | ICD-10-CM | POA: Diagnosis not present

## 2018-11-23 DIAGNOSIS — M75121 Complete rotator cuff tear or rupture of right shoulder, not specified as traumatic: Secondary | ICD-10-CM | POA: Diagnosis not present

## 2018-11-23 MED ORDER — IOPAMIDOL (ISOVUE-M 200) INJECTION 41%
20.0000 mL | Freq: Once | INTRAMUSCULAR | Status: AC
  Administered 2018-11-23: 20 mL via INTRA_ARTICULAR

## 2018-11-27 ENCOUNTER — Other Ambulatory Visit: Payer: Self-pay

## 2018-11-27 ENCOUNTER — Encounter: Payer: Self-pay | Admitting: Family Medicine

## 2018-11-27 ENCOUNTER — Ambulatory Visit (INDEPENDENT_AMBULATORY_CARE_PROVIDER_SITE_OTHER)
Admission: RE | Admit: 2018-11-27 | Discharge: 2018-11-27 | Disposition: A | Discharge status: Home / Self Care | Payer: BLUE CROSS/BLUE SHIELD | Source: Ambulatory Visit | Attending: Family Medicine | Admitting: Family Medicine

## 2018-11-27 ENCOUNTER — Ambulatory Visit (INDEPENDENT_AMBULATORY_CARE_PROVIDER_SITE_OTHER): Payer: BLUE CROSS/BLUE SHIELD | Admitting: Family Medicine

## 2018-11-27 ENCOUNTER — Ambulatory Visit: Payer: Self-pay

## 2018-11-27 VITALS — BP 124/74 | HR 69 | Ht 71.0 in | Wt 186.0 lb

## 2018-11-27 DIAGNOSIS — K4090 Unilateral inguinal hernia, without obstruction or gangrene, not specified as recurrent: Secondary | ICD-10-CM | POA: Diagnosis not present

## 2018-11-27 DIAGNOSIS — M75101 Unspecified rotator cuff tear or rupture of right shoulder, not specified as traumatic: Secondary | ICD-10-CM | POA: Diagnosis not present

## 2018-11-27 DIAGNOSIS — M25552 Pain in left hip: Secondary | ICD-10-CM | POA: Diagnosis not present

## 2018-11-27 DIAGNOSIS — S46011D Strain of muscle(s) and tendon(s) of the rotator cuff of right shoulder, subsequent encounter: Secondary | ICD-10-CM

## 2018-11-27 DIAGNOSIS — M25551 Pain in right hip: Secondary | ICD-10-CM | POA: Diagnosis not present

## 2018-11-27 DIAGNOSIS — S79911A Unspecified injury of right hip, initial encounter: Secondary | ICD-10-CM | POA: Diagnosis not present

## 2018-11-27 NOTE — Patient Instructions (Signed)
Referral to [facility] RTC Referral to General Surgery Mirilax 17 g daily and colace 100mg  daily with pain medicine Go to ED with nausea vomitting or increase in pain Write with questions

## 2018-11-27 NOTE — Assessment & Plan Note (Signed)
MRI present patient does have a rotator cuff tear and is failed all conservative therapy at this time.  I would like patient to follow-up with surgeon.  Patient will be referred for orthopedic surgery to discuss surgical intervention.

## 2018-11-27 NOTE — Progress Notes (Signed)
[facility] [place], [place] [address] Phone: [phone] Subjective:     CC: Left abdominal pain and hip pain  [id number]  Harry Nash is a 63 y.o. male coming in with complaint of left lower abdominal pain. Pain increase with hip flexion. Slipped on wet tile floor yesterday landing on right side. Has been having left sided abdominal pain since then.  Patient states that the pain is severe.  Patient states that when he gets up from a seated position he has severe amount of pain initially but then seems to resolve if he holds his stomach and starts walking.Denies any true groin pain pain some mild radiation down the leg.  Denies weakness though.   Is to follow-up after MRI of right shoulder.  MRI right shoulder shows the patient does have a rotator cuff tear as well as a labral tear and osteoarthritic changes patient has failed all conservative therapy.  Past Medical History:  Diagnosis Date  . Depression   . Headache(784.0)   . Kidney stones    No past surgical history on file. Social History   Socioeconomic History  . Marital status: Married    Spouse name: Not on file  . Number of children: Not on file  . Years of education: Not on file  . Highest education level: Not on file  Occupational History  . Not on file  Social Needs  . Financial resource strain: Not on file  . Food insecurity    Worry: Not on file    Inability: Not on file  . Transportation needs    Medical: Not on file    Non-medical: Not on file  Tobacco Use  . Smoking status: Former Smoker    Quit date: 02/01/2003    Years since quitting: 15.8  . Smokeless tobacco: Never Used  Substance and Sexual Activity  . Alcohol use: Yes    Alcohol/week: 1.0 standard drinks    Types: 1 Cans of beer per week    Comment: moderate  . Drug use: No  . Sexual activity: Yes  Lifestyle  . Physical activity    Days per week: Not on file    Minutes per session: Not on  file  . Stress: Not on file  Relationships  . Social Herbalist on phone: Not on file    Gets together: Not on file    Attends religious service: Not on file    Active member of club or organization: Not on file    Attends meetings of clubs or organizations: Not on file    Relationship status: Not on file  Other Topics Concern  . Not on file  Social History Narrative  . Not on file   Allergies  Allergen Reactions  . Gabapentin Other (See Comments)    Clouded mentation   Family History  Problem Relation Age of Onset  . Heart disease Mother   . Heart disease Father   . Hypertension Father      Current Outpatient Medications (Cardiovascular):  .  rosuvastatin (CRESTOR) 10 MG tablet, Take 1 tablet (10 mg total) by mouth daily. .  sildenafil (VIAGRA) 100 MG tablet, TAKE 1/2 TO 1 TABLET BY MOUTH DAILY AS NEEDED FOR ERECTILE DYSFUNCTION.   Current Outpatient Medications (Analgesics):  .  aspirin 81 MG tablet, Take 1 tablet (81 mg total) by mouth daily. .  naproxen sodium (ANAPROX) 220 MG tablet, Take 220-440 mg by mouth 2 (two)  times daily with a meal. .  rizatriptan (MAXALT) 10 MG tablet, TAKE 1 TAB AT ONSET OF SYMPTOMS, MAY REPEAT IN 2 HOURS.DO NOT EXCEED 2 TABS/24 HRS.   Current Outpatient Medications (Other):  Marland Kitchen  LORazepam (ATIVAN) 0.5 MG tablet, TAKE ONE TABLET TWICE A DAY AS NEEDED FOR ANXIETY-MUST LAST 30 DAYS.    Past medical history, social, surgical and family history all reviewed in electronic medical record.  No pertanent information unless stated regarding to the chief complaint.   Review of Systems:  No headache, visual changes, nausea, vomiting, diarrhea, constipation, dizziness, abdominal pain, skin rash, fevers, chills, night sweats, weight loss, swollen lymph nodes, body aches, joint swelling,  chest pain, shortness of breath, mood changes.  Positive muscle aches  Objective  Blood pressure 124/74, pulse 69, height 5\' 11"  (1.803 m), weight 186 lb  (84.4 kg), SpO2 98 %.    General: No apparent distress alert and oriented x3 mood and affect normal, dressed appropriately.  HEENT: Pupils equal, extraocular movements intact  Respiratory: Patient's speak in full sentences and does not appear short of breath  Cardiovascular: No lower extremity edema, non tender, no erythema  Skin: Warm dry intact with no signs of infection or rash on extremities or on axial skeleton.  Abdomen: Soft patient does have swelling of the left lower quadrant.  And in the inguinal canal with Valsalva patient does have what appears to be a new hernia.  Severely tender to palpation in this area.  Patient has some pain no with movement of the hip as well in this area against resistance.  Negative fulcrum test though noted.  Neuro: Cranial nerves II through XII are intact, neurovascularly intact in all extremities with 2+ DTRs and 2+ pulses.  Lymph: No lymphadenopathy of posterior or anterior cervical chain or axillae bilaterally.  Gait very careful.  MSK:  Non tender with full range of motion and good stability and symmetric strength and tone of  elbows, wrist, hip, knee and ankles bilaterally.   Limited musculoskeletal ultrasound was performed and interpreted by Lyndal Pulley  Ultrasound of patient's left lower quadrant around the inguinal ring shows the patient does have bulging noted.  This is under Valsalva.  Picture saved.  Severely tender when patient does this.     Impression and Recommendations:     This case required medical decision making of moderate complexity. The above documentation has been reviewed and is accurate and complete Lyndal Pulley, DO       Note: This dictation was prepared with Dragon dictation along with smaller phrase technology. Any transcriptional errors that result from this process are unintentional.

## 2018-11-27 NOTE — Assessment & Plan Note (Signed)
I believe the patient does have a hernia, patient seems to be stable.  Has pain medications, discussed keeping stool soft at the moment, referred to general surgery to further evaluation and potential treatment.

## 2018-11-28 ENCOUNTER — Other Ambulatory Visit: Payer: Self-pay | Admitting: Family Medicine

## 2018-11-28 ENCOUNTER — Encounter: Payer: Self-pay | Admitting: Family Medicine

## 2018-11-28 DIAGNOSIS — M25552 Pain in left hip: Secondary | ICD-10-CM

## 2018-12-04 ENCOUNTER — Ambulatory Visit: Payer: Self-pay | Admitting: Surgery

## 2018-12-04 DIAGNOSIS — K4090 Unilateral inguinal hernia, without obstruction or gangrene, not specified as recurrent: Secondary | ICD-10-CM | POA: Diagnosis not present

## 2018-12-04 NOTE — H&P (Signed)
History of Present Illness Imogene Burn. Aniston Christman MD; 12/04/2018 5:01 PM) The patient is a 63 year old male who presents with an inguinal hernia. Referred by [name] for left inguinal hernia  This is a 63 year old male in good health who presents after a recent fall about 8 days ago. He fell on his right side. Subsequently he developed some pain in his left groin. He denies any significant swelling. He was evaluated by Dr. Tamala Julian who noted a small left inguinal hernia. He did a bedside ultrasound that confirmed this. The patient states that his symptoms have resolved. The patient also has a tear of his right rotator cuff and is scheduled to see orthopedics tomorrow. He comes in today for surgical evaluation of his left inguinal hernia.     Problem List/Past Medical Rodman Key K. Chantay Whitelock, MD; 12/04/2018 5:02 PM) LEFT INGUINAL HERNIA (K40.90)  Past Surgical History (Tanisha A. Owens Shark, [place]; 12/04/2018 2:47 PM) Foot Surgery Left.  Diagnostic Studies History (Tanisha A. Owens Shark, [place]; 12/04/2018 2:47 PM) Colonoscopy 1-5 years ago  Allergies (Tanisha A. Owens Shark, [place]; 12/04/2018 2:48 PM) No Known Drug Allergies [12/04/2018]: Allergies Reconciled  Medication History (Tanisha A. Owens Shark, [place]; 12/04/2018 2:48 PM) Rosuvastatin Calcium (10MG  Tablet, Oral) Active. Aspirin (81MG  Tablet, Oral) Active. Medications Reconciled  Social History (Tanisha A. Owens Shark, [place]; 12/04/2018 2:47 PM) Alcohol use Moderate alcohol use. Caffeine use Coffee, Tea. Tobacco use Former smoker.  Family History (Tanisha A. Owens Shark, [place]; 12/04/2018 2:47 PM) Alcohol Abuse Father. Breast Cancer Mother. Heart Disease Brother, Father, Mother, Sister. Heart disease in male family member before age 45 Hypertension Brother, Father. Melanoma Brother.  Other Problems Imogene Burn. Chandani Rogowski, MD; 12/04/2018 5:02 PM) Diverticulosis Kidney Stone Migraine Headache     Review of Systems (Tanisha A. Brown RMA; 12/04/2018  2:47 PM) General Not Present- Appetite Loss, Chills, Fatigue, Fever, Night Sweats, Weight Gain and Weight Loss. Skin Not Present- Change in Wart/Mole, Dryness, Hives, Jaundice, New Lesions, Non-Healing Wounds, Rash and Ulcer. HEENT Not Present- Earache, Hearing Loss, Hoarseness, Nose Bleed, Oral Ulcers, Ringing in the Ears, Seasonal Allergies, Sinus Pain, Sore Throat, Visual Disturbances, Wears glasses/contact lenses and Yellow Eyes. Respiratory Not Present- Bloody sputum, Chronic Cough, Difficulty Breathing, Snoring and Wheezing. Cardiovascular Not Present- Chest Pain, Difficulty Breathing Lying Down, Leg Cramps, Palpitations, Rapid Heart Rate, Shortness of Breath and Swelling of Extremities. Male Genitourinary Not Present- Blood in Urine, Change in Urinary Stream, Frequency, Impotence, Nocturia, Painful Urination, Urgency and Urine Leakage. Musculoskeletal Not Present- Back Pain, Joint Pain, Joint Stiffness, Muscle Pain, Muscle Weakness and Swelling of Extremities. Neurological Not Present- Decreased Memory, Fainting, Headaches, Numbness, Seizures, Tingling, Tremor, Trouble walking and Weakness. Psychiatric Not Present- Anxiety, Bipolar, Change in Sleep Pattern, Depression, Fearful and Frequent crying. Endocrine Not Present- Cold Intolerance, Excessive Hunger, Hair Changes, Heat Intolerance, Hot flashes and New Diabetes. Hematology Not Present- Blood Thinners, Easy Bruising, Excessive bleeding, Gland problems, HIV and Persistent Infections.  Vitals (Tanisha A. Brown RMA; 12/04/2018 2:48 PM) 12/04/2018 2:47 PM Weight: 192.6 lb Height: 71in Body Surface Area: 2.08 m Body Mass Index: 26.86 kg/m  Temp.: 98.52F  Pulse: 85 (Regular)  BP: 128/84 (Sitting, Left Arm, Standard)        Physical Exam Rodman Key K. Allante Whitmire MD; 12/04/2018 5:03 PM)  The physical exam findings are as follows: Note:WDWN in NAD Eyes: Pupils equal, round; sclera anicteric HENT: Oral mucosa moist; good  dentition Neck: No masses palpated, no thyromegaly Lungs: CTA bilaterally; normal respiratory effort CV: Regular rate and rhythm; no murmurs; extremities well-perfused  with no edema Abd: +bowel sounds, soft, non-tender, no palpable organomegaly; no palpable hernias GU: bilateral descended testes; no testicular masses; no right inguinal hernia; small reducible left inguinal hernia Skin: Warm, dry; no sign of jaundice Psychiatric - alert and oriented x 4; calm mood and affect    Assessment & Plan Rodman Key K. Danile Trier MD; 12/04/2018 5:04 PM)  LEFT INGUINAL HERNIA (K40.90)  Current Plans Schedule for Surgery - Left inguinal hernia repair with mesh. The surgical procedure has been discussed with the patient. Potential risks, benefits, alternative treatments, and expected outcomes have been explained. All of the patient's questions at this time have been answered. The likelihood of reaching the patient's treatment goal is good. The patient understand the proposed surgical procedure and wishes to proceed. Note:The patient is relatively asymptomatic at this time. We can schedule his hernia repair around his rotator cuff repair. I do not believe it would be advisable to perform both surgeries at the same time as the rehabilitation from his shoulder surgery may interfere with his recovery from hernia surgery. We will wait to hear from the patient after his orthopedics evaluation tomorrow.   Imogene Burn. Georgette Dover, MD, [facility] Surgery  General/ Trauma Surgery   12/04/2018 5:04 PM

## 2018-12-05 DIAGNOSIS — M75121 Complete rotator cuff tear or rupture of right shoulder, not specified as traumatic: Secondary | ICD-10-CM | POA: Diagnosis not present

## 2018-12-06 ENCOUNTER — Encounter: Payer: Self-pay | Admitting: Family Medicine

## 2018-12-12 DIAGNOSIS — L821 Other seborrheic keratosis: Secondary | ICD-10-CM | POA: Diagnosis not present

## 2018-12-12 DIAGNOSIS — Z85828 Personal history of other malignant neoplasm of skin: Secondary | ICD-10-CM | POA: Diagnosis not present

## 2018-12-12 DIAGNOSIS — L814 Other melanin hyperpigmentation: Secondary | ICD-10-CM | POA: Diagnosis not present

## 2018-12-12 DIAGNOSIS — D225 Melanocytic nevi of trunk: Secondary | ICD-10-CM | POA: Diagnosis not present

## 2018-12-14 ENCOUNTER — Other Ambulatory Visit: Payer: Self-pay | Admitting: Family Medicine

## 2018-12-14 DIAGNOSIS — F4323 Adjustment disorder with mixed anxiety and depressed mood: Secondary | ICD-10-CM

## 2018-12-17 DIAGNOSIS — M75121 Complete rotator cuff tear or rupture of right shoulder, not specified as traumatic: Secondary | ICD-10-CM | POA: Diagnosis not present

## 2018-12-17 DIAGNOSIS — M7521 Bicipital tendinitis, right shoulder: Secondary | ICD-10-CM | POA: Diagnosis not present

## 2018-12-17 DIAGNOSIS — G8918 Other acute postprocedural pain: Secondary | ICD-10-CM | POA: Diagnosis not present

## 2018-12-17 DIAGNOSIS — M7541 Impingement syndrome of right shoulder: Secondary | ICD-10-CM | POA: Diagnosis not present

## 2018-12-31 DIAGNOSIS — M75121 Complete rotator cuff tear or rupture of right shoulder, not specified as traumatic: Secondary | ICD-10-CM | POA: Diagnosis not present

## 2018-12-31 DIAGNOSIS — M25611 Stiffness of right shoulder, not elsewhere classified: Secondary | ICD-10-CM | POA: Diagnosis not present

## 2019-01-08 DIAGNOSIS — M25611 Stiffness of right shoulder, not elsewhere classified: Secondary | ICD-10-CM | POA: Diagnosis not present

## 2019-01-08 DIAGNOSIS — M75121 Complete rotator cuff tear or rupture of right shoulder, not specified as traumatic: Secondary | ICD-10-CM | POA: Diagnosis not present

## 2019-01-10 DIAGNOSIS — M75121 Complete rotator cuff tear or rupture of right shoulder, not specified as traumatic: Secondary | ICD-10-CM | POA: Diagnosis not present

## 2019-01-10 DIAGNOSIS — M25611 Stiffness of right shoulder, not elsewhere classified: Secondary | ICD-10-CM | POA: Diagnosis not present

## 2019-01-14 DIAGNOSIS — M75121 Complete rotator cuff tear or rupture of right shoulder, not specified as traumatic: Secondary | ICD-10-CM | POA: Diagnosis not present

## 2019-01-14 DIAGNOSIS — M25611 Stiffness of right shoulder, not elsewhere classified: Secondary | ICD-10-CM | POA: Diagnosis not present

## 2019-01-21 DIAGNOSIS — M25611 Stiffness of right shoulder, not elsewhere classified: Secondary | ICD-10-CM | POA: Diagnosis not present

## 2019-01-21 DIAGNOSIS — M75121 Complete rotator cuff tear or rupture of right shoulder, not specified as traumatic: Secondary | ICD-10-CM | POA: Diagnosis not present

## 2019-01-23 DIAGNOSIS — M75121 Complete rotator cuff tear or rupture of right shoulder, not specified as traumatic: Secondary | ICD-10-CM | POA: Diagnosis not present

## 2019-01-23 DIAGNOSIS — Z9889 Other specified postprocedural states: Secondary | ICD-10-CM | POA: Diagnosis not present

## 2019-01-23 DIAGNOSIS — M25611 Stiffness of right shoulder, not elsewhere classified: Secondary | ICD-10-CM | POA: Diagnosis not present

## 2019-01-28 DIAGNOSIS — M25611 Stiffness of right shoulder, not elsewhere classified: Secondary | ICD-10-CM | POA: Diagnosis not present

## 2019-01-28 DIAGNOSIS — M75121 Complete rotator cuff tear or rupture of right shoulder, not specified as traumatic: Secondary | ICD-10-CM | POA: Diagnosis not present

## 2019-01-31 DIAGNOSIS — M75121 Complete rotator cuff tear or rupture of right shoulder, not specified as traumatic: Secondary | ICD-10-CM | POA: Diagnosis not present

## 2019-01-31 DIAGNOSIS — M25611 Stiffness of right shoulder, not elsewhere classified: Secondary | ICD-10-CM | POA: Diagnosis not present

## 2019-02-04 DIAGNOSIS — M75121 Complete rotator cuff tear or rupture of right shoulder, not specified as traumatic: Secondary | ICD-10-CM | POA: Diagnosis not present

## 2019-02-04 DIAGNOSIS — M25611 Stiffness of right shoulder, not elsewhere classified: Secondary | ICD-10-CM | POA: Diagnosis not present

## 2019-02-07 DIAGNOSIS — M25611 Stiffness of right shoulder, not elsewhere classified: Secondary | ICD-10-CM | POA: Diagnosis not present

## 2019-02-07 DIAGNOSIS — M75121 Complete rotator cuff tear or rupture of right shoulder, not specified as traumatic: Secondary | ICD-10-CM | POA: Diagnosis not present

## 2019-02-08 DIAGNOSIS — Z01818 Encounter for other preprocedural examination: Secondary | ICD-10-CM | POA: Diagnosis not present

## 2019-02-12 DIAGNOSIS — K4090 Unilateral inguinal hernia, without obstruction or gangrene, not specified as recurrent: Secondary | ICD-10-CM | POA: Diagnosis not present

## 2019-02-12 DIAGNOSIS — G8918 Other acute postprocedural pain: Secondary | ICD-10-CM | POA: Diagnosis not present

## 2019-02-14 ENCOUNTER — Encounter: Payer: Self-pay | Admitting: Family Medicine

## 2019-02-21 ENCOUNTER — Encounter: Payer: Self-pay | Admitting: Family Medicine

## 2019-02-21 ENCOUNTER — Ambulatory Visit (INDEPENDENT_AMBULATORY_CARE_PROVIDER_SITE_OTHER): Payer: BLUE CROSS/BLUE SHIELD | Admitting: Family Medicine

## 2019-02-21 ENCOUNTER — Other Ambulatory Visit: Payer: Self-pay

## 2019-02-21 DIAGNOSIS — F4323 Adjustment disorder with mixed anxiety and depressed mood: Secondary | ICD-10-CM | POA: Diagnosis not present

## 2019-02-21 DIAGNOSIS — S46219A Strain of muscle, fascia and tendon of other parts of biceps, unspecified arm, initial encounter: Secondary | ICD-10-CM | POA: Diagnosis not present

## 2019-02-21 DIAGNOSIS — R7401 Elevation of levels of liver transaminase levels: Secondary | ICD-10-CM

## 2019-02-21 DIAGNOSIS — K4090 Unilateral inguinal hernia, without obstruction or gangrene, not specified as recurrent: Secondary | ICD-10-CM

## 2019-02-21 DIAGNOSIS — E7800 Pure hypercholesterolemia, unspecified: Secondary | ICD-10-CM | POA: Diagnosis not present

## 2019-02-21 MED ORDER — ROSUVASTATIN CALCIUM 10 MG PO TABS: 10.0000 mg | ORAL_TABLET | Freq: Every day | ORAL | 3 refills | Status: DC

## 2019-02-21 MED ORDER — LORAZEPAM 0.5 MG PO TABS: ORAL_TABLET | ORAL | 1 refills | Status: DC

## 2019-02-21 NOTE — Progress Notes (Signed)
  Subjective:     Patient ID: Harry Nash, male   DOB: 1955/05/01, 64 y.o.   MRN: [id number]  HPI  Not an annual exam.  FU of several problems. 1. Recent left inguinal hernia repair.  Some swelling and testicular discomfort.  Wants me to check. 2. Needs med [facility]   3. Strong FHx of cardiac disease.  He denies CP or DOE.  Several sibs with MIs recently.  I started on ASA and rosuvastatin last year. No lipid panel since.  He is worried - he has two young children that motivate him.   4. HPDP  Up to date.  He is anxious to get the COVID vaccine when his group comes up. 5. Erectile dysfunction well controled on viagra. 6. Several months ago had repair of left rotator cuff tear. 7. Could not continue Red[place] with [place].  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  Depression is OK. Review of Systems     Objective:   Physical Exam  Lungs clear Cardiac RRR without m or g Left groin shows mild edema, no sig erythema and appropriately healing incision.     Assessment:     See problems    Plan:     See problems

## 2019-02-21 NOTE — Patient Instructions (Signed)
I will call with test results I sent in refills The hernia incision looks good to me Someone should call with the cardiology referral. Stay safe

## 2019-02-21 NOTE — Assessment & Plan Note (Signed)
Stable despite stresses of COVID.

## 2019-02-21 NOTE — Assessment & Plan Note (Addendum)
I am most concerned with his strongly positive CAD FHx.  Will refer to cards to discuss further WU.  Currently I am treating as primary prevention.  He may qualify for secondary prevention with LDL goal<70  Informed of labs.  Cholesterol OK for primary prevention.  We will see what cards has to say about CAD work up.

## 2019-02-21 NOTE — Assessment & Plan Note (Signed)
Finally returning to normal

## 2019-02-21 NOTE — Assessment & Plan Note (Addendum)
Healing well.

## 2019-02-22 ENCOUNTER — Encounter: Payer: Self-pay | Admitting: Family Medicine

## 2019-02-22 DIAGNOSIS — R7401 Elevation of levels of liver transaminase levels: Secondary | ICD-10-CM | POA: Insufficient documentation

## 2019-02-22 LAB — CMP14+EGFR
ALT: 58 IU/L — ABNORMAL HIGH (ref 0–44)
AST: 28 IU/L (ref 0–40)
Albumin/Globulin Ratio: 2 (ref 1.2–2.2)
Albumin: 4.4 g/dL (ref 3.8–4.8)
Alkaline Phosphatase: 88 IU/L (ref 39–117)
BUN/Creatinine Ratio: 18 (ref 10–24)
BUN: 14 mg/dL (ref 8–27)
Bilirubin Total: 0.5 mg/dL (ref 0.0–1.2)
CO2: 25 mmol/L (ref 20–29)
Calcium: 9.7 mg/dL (ref 8.6–10.2)
Chloride: 102 mmol/L (ref 96–106)
Creatinine, Ser: 0.8 mg/dL (ref 0.76–1.27)
GFR calc Af Amer: 110 mL/min/{1.73_m2} (ref >59)
GFR calc non Af Amer: 95 mL/min/{1.73_m2} (ref >59)
Globulin, Total: 2.2 g/dL (ref 1.5–4.5)
Glucose: 90 mg/dL (ref 65–99)
Potassium: 4.7 mmol/L (ref 3.5–5.2)
Sodium: 141 mmol/L (ref 134–144)
Total Protein: 6.6 g/dL (ref 6.0–8.5)

## 2019-02-22 LAB — LIPID PANEL
Chol/HDL Ratio: 3 ratio (ref 0.0–5.0)
Cholesterol, Total: 173 mg/dL (ref 100–199)
HDL: 57 mg/dL (ref >39)
LDL Chol Calc (NIH): 103 mg/dL — ABNORMAL HIGH (ref 0–99)
Triglycerides: 65 mg/dL (ref 0–149)
VLDL Cholesterol Cal: 13 mg/dL (ref 5–40)

## 2019-02-22 NOTE — Assessment & Plan Note (Signed)
Informed of mild elevation of ALT.  States EtOh intake is ~1 beer per night.  Will repeat in three months.

## 2019-02-25 ENCOUNTER — Encounter: Payer: Self-pay | Admitting: Cardiology

## 2019-02-25 ENCOUNTER — Encounter: Payer: Self-pay | Admitting: Family Medicine

## 2019-02-25 ENCOUNTER — Other Ambulatory Visit: Payer: Self-pay

## 2019-02-25 ENCOUNTER — Ambulatory Visit: Payer: BLUE CROSS/BLUE SHIELD | Admitting: Cardiology

## 2019-02-25 VITALS — BP 138/78 | HR 76 | Temp 96.6°F | Ht 71.0 in | Wt 192.4 lb

## 2019-02-25 DIAGNOSIS — E7800 Pure hypercholesterolemia, unspecified: Secondary | ICD-10-CM

## 2019-02-25 DIAGNOSIS — Z7189 Other specified counseling: Secondary | ICD-10-CM | POA: Diagnosis not present

## 2019-02-25 DIAGNOSIS — Z7182 Exercise counseling: Secondary | ICD-10-CM

## 2019-02-25 DIAGNOSIS — M25519 Pain in unspecified shoulder: Secondary | ICD-10-CM

## 2019-02-25 DIAGNOSIS — Z87891 Personal history of nicotine dependence: Secondary | ICD-10-CM

## 2019-02-25 DIAGNOSIS — Z9189 Other specified personal risk factors, not elsewhere classified: Secondary | ICD-10-CM | POA: Diagnosis not present

## 2019-02-25 DIAGNOSIS — Z8249 Family history of ischemic heart disease and other diseases of the circulatory system: Secondary | ICD-10-CM | POA: Diagnosis not present

## 2019-02-25 DIAGNOSIS — L723 Sebaceous cyst: Secondary | ICD-10-CM | POA: Diagnosis not present

## 2019-02-25 NOTE — Patient Instructions (Addendum)
Medication Instructions:  Your Physician recommend you continue on your current medication as directed.    *If you need a refill on your cardiac medications before your next appointment, please call your pharmacy*  Lab Work: None  Testing/Procedures: None  Follow-Up: At [facility], you and your health needs are our priority.  As part of our continuing mission to provide you with exceptional heart care, we have created designated Provider Care Teams.  These Care Teams include your primary Cardiologist (physician) and Advanced Practice Providers (APPs -  Physician Assistants and Nurse Practitioners) who all work together to provide you with the care you need, when you need it.  Your next appointment:   2 year(s)  The format for your next appointment:   Either In Person or Virtual  Provider:   Buford Dresser, MD  Other Instructions Please ask your brothers whether they had bicuspid aortic valves.

## 2019-02-25 NOTE — Progress Notes (Signed)
Cardiology Office Note:    Date:  02/25/2019   ID:  Harry Nash, DOB 09-23-1955, MRN [id number]  PCP:  [name], MD  Cardiologist:  Buford Dresser, MD  Referring MD: [name], MD   CC: new patient consultation for the evaluation and management of cardiovascular risk  History of Present Illness:    Harry Nash is a 64 y.o. male without  who is seen as a new consult at the request of Hensel, Jamal Collin, MD for the evaluation and management of cardiovascular risk.  Note from Dr. Andria Frames from 02/22/19 reviewed. Concern noted for family history of cardiovascular disease. Referred for discussion of risk.  Today:  Wants to discuss CV risk and prevention. Reviewed below.  Cardiovascular risk factors: Prior clinical ASCVD: none Comorbid conditions: Denies hypertension, diabetes, chronic kidney disease. Does have a history of hyperlipidemia, has been on rosuvastatin for about a year. Metabolic syndrome/Obesity: BMI 26 Chronic inflammatory conditions: none. Has OA but not ever been on steroids for it Tobacco use history: former, quit 15 years ago.  Family history: 6 siblings. Father died of MI at age 51. Mother had 4V CABG in her 35s, lived to be 13. Oldest brother had valve replacement, multiple bypasses. He is 94 years older, done 8-10 years ago, was in his late 49s at the time. Next brother had MI about 1.5 years ago in [place], had a stent placed. Oldest sister unknown heart history, Next brother had 2V bypass, mitral valve repair last month (age 58). Next sister had heart racing, now on medication. Two siblings are unknown history. Prior cardiac testing and/or incidental findings on other testing (ie coronary calcium): echo available, but I cannot see stress test. 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 With pandemic has been minimally active. Has two young children, which keeps him active. Recovering from hernia surgery two  weeks ago Current diet: healthy diet. Organic food, farmer's market, no processed foods, minimal fried foods.  Denies  Recent chest pain, shortness of breath at rest or with normal exertion. No PND, orthopnea, LE edema or unexpected weight gain. No syncope or palpitations.  Remote history in 2013 of going to the ER, had chest and throat symptoms. Workup negative, stress test normal. Related to hot tacos, using post hole digger. I can see echo but not stress test results.  Past Medical History:  Diagnosis Date  . Depression   . Headache(784.0)   . Kidney stones     History reviewed. No pertinent surgical history.  Current Medications: Current Outpatient Medications on File Prior to Visit  Medication Sig  . aspirin 81 MG tablet Take 1 tablet (81 mg total) by mouth daily.  Marland Kitchen LORazepam (ATIVAN) 0.5 MG tablet TAKE ONE TABLET TWICE A DAY AS NEEDED FOR ANXIETy  . naproxen sodium (ANAPROX) 220 MG tablet Take 220-440 mg by mouth 2 (two) times daily with a meal.  . rizatriptan (MAXALT) 10 MG tablet TAKE 1 TAB AT ONSET OF SYMPTOMS, MAY REPEAT IN 2 HOURS.DO NOT EXCEED 2 TABS/24 HRS.  . rosuvastatin (CRESTOR) 10 MG tablet Take 1 tablet (10 mg total) by mouth daily.  . sildenafil (VIAGRA) 100 MG tablet TAKE 1/2 TO 1 TABLET BY MOUTH DAILY AS NEEDED FOR ERECTILE DYSFUNCTION.   No current facility-administered medications on file prior to visit.     Allergies:   Gabapentin   Social History   Tobacco Use  . Smoking status: Former Audiological scientist  date: 02/01/2003    Years since quitting: 16.0  . Smokeless tobacco: Never Used  Substance Use Topics  . Alcohol use: Yes    Alcohol/week: 1.0 standard drinks    Types: 1 Cans of beer per week    Comment: moderate  . Drug use: No    Family History: family history includes Heart disease in his father and mother; Hypertension in his father.  ROS:   Please see the history of present illness.  Additional pertinent ROS: Constitutional: Negative for  chills, fever, night sweats, unintentional weight loss  HENT: Negative for ear pain and hearing loss.   Eyes: Negative for loss of vision and eye pain.  Respiratory: Negative for cough, sputum, wheezing.   Cardiovascular: See HPI. Gastrointestinal: Negative for abdominal pain, melena, and hematochezia.  Genitourinary: Negative for dysuria and hematuria.  Musculoskeletal: Negative for falls and myalgias.  Skin: Negative for itching and rash.  Neurological: Negative for focal weakness, focal sensory changes and loss of consciousness.  Endo/Heme/Allergies: Does not bruise/bleed easily.     EKGs/Labs/Other Studies Reviewed:    The following studies were reviewed today: Echo 2013 - Left ventricle: The cavity size was normal. Systolic  function was normal. The estimated ejection fraction was  in the range of 60% to 65%. Wall motion was normal; there  were no regional wall motion abnormalities. Left  ventricular diastolic function parameters were normal.  - Aortic valve: Trivial regurgitation.   EKG:  EKG is personally reviewed.  The ekg ordered today demonstrates NSR  Recent Labs: 02/26/2018: TSH 0.925 02/21/2019: ALT 58; BUN 14; Creatinine, Ser 0.80; Potassium 4.7; Sodium 141  Recent Lipid Panel    Component Value Date/Time   CHOL 173 02/21/2019 1246   TRIG 65 02/21/2019 1246   HDL 57 02/21/2019 1246   CHOLHDL 3.0 02/21/2019 1246   CHOLHDL 3.4 07/23/2015 1037   VLDL 23 07/23/2015 1037   LDLCALC 103 (H) 02/21/2019 1246   LDLDIRECT 117 (H) 02/12/2010 2025      Physical Exam:    VS:  BP 138/78   Pulse 76   Temp (!) 96.6 F (35.9 C)   Ht 5\' 11"  (1.803 m)   Wt 192 lb 6.4 oz (87.3 kg)   SpO2 96%   BMI 26.83 kg/m     Wt Readings from Last 3 Encounters:  02/25/19 192 lb 6.4 oz (87.3 kg)  02/21/19 192 lb 9.6 oz (87.4 kg)  11/27/18 186 lb (84.4 kg)    GEN: Well nourished, well developed in no acute distress HEENT: Normal, moist mucous membranes NECK: No  JVD CARDIAC: regular rhythm, normal S1 and S2, no rubs or gallops. No murmurs. VASCULAR: Radial and DP pulses 2+ bilaterally. No carotid bruits RESPIRATORY:  Clear to auscultation without rales, wheezing or rhonchi  ABDOMEN: Soft, non-tender, non-distended MUSCULOSKELETAL:  Ambulates independently SKIN: Warm and dry, no edema NEUROLOGIC:  Alert and oriented x 3. No focal neuro deficits noted. PSYCHIATRIC:  Normal affect    ASSESSMENT:    1. Cardiovascular risk factor   2. Pure hypercholesterolemia   3. Family history of heart disease   4. Cardiac risk counseling   5. Counseling on health promotion and disease prevention    PLAN:    Hyperlipidemia: lipid profile pattern noted above. On rosuvastatin, tolerating -with strong family history of cardiovascular disease, would aim for LDL goal of 100.  Family history of aortic disease: -he will ask his brothers if they had bicuspid aortic valves. If so, he needs screening  Cardiac risk counseling and prevention recommendations: -recommend heart healthy/Mediterranean diet, with whole grains, fruits, vegetable, fish, lean meats, nuts, and olive oil. Limit salt. He has an excellent diet -recommend moderate walking, 3-5 times/week for 30-50 minutes each session. Aim for at least 150 minutes.week. Goal should be pace of 3 miles/hours, or walking 1.5 miles in 30 minutes. He had an excellent exercise lifestyle in the past, recently decreased but plans to return to good habits when recovered from surgery. -recommend avoidance of tobacco products. Avoid excess alcohol. -Additional risk factor control:  -Diabetes risk: A1c is not available  -Lipids: as above  -Blood pressure control: reasonable control, on no meds  -Weight:  BMI 26 -ASCVD risk score: The 10-year ASCVD risk score Mikey Bussing DC Brooke Bonito., et al., 2013) is: 10.2%   Values used to calculate the score:     Age: 89 years     Sex: Male     Is Non-Hispanic African American: No     Diabetic: No      Tobacco smoker: No     Systolic Blood Pressure: 0000000 mmHg     Is BP treated: No     HDL Cholesterol: 57 mg/dL     Total Cholesterol: 173 mg/dL    Plan for follow up: 2 years or sooner PRN  Total time of encounter: 54 minutes total time of encounter, including 44 minutes spent in face-to-face patient care. This time includes coordination of care and counseling regarding CV risj. Remainder of non-face-to-face time involved reviewing chart documents/testing relevant to the patient encounter and documentation in the medical record.  In room at 11:06, out at 11:50 AM.  Buford Dresser, MD, PhD [place]  [facility]    Medication Adjustments/Labs and Tests Ordered: Current medicines are reviewed at length with the patient today.  Concerns regarding medicines are outlined above.  Orders Placed This Encounter  Procedures  . EKG 12-Lead   No orders of the defined types were placed in this encounter.   Patient Instructions  Medication Instructions:  Your Physician recommend you continue on your current medication as directed.    *If you need a refill on your cardiac medications before your next appointment, please call your pharmacy*  Lab Work: None  Testing/Procedures: None  Follow-Up: At [facility], you and your health needs are our priority.  As part of our continuing mission to provide you with exceptional heart care, we have created designated Provider Care Teams.  These Care Teams include your primary Cardiologist (physician) and Advanced Practice Providers (APPs -  Physician Assistants and Nurse Practitioners) who all work together to provide you with the care you need, when you need it.  Your next appointment:   2 year(s)  The format for your next appointment:   Either In Person or Virtual  Provider:   Buford Dresser, MD  Other Instructions Please ask your brothers whether they had bicuspid aortic valves.   Signed, Buford Dresser, MD  PhD 02/25/2019  [place]

## 2019-03-05 ENCOUNTER — Other Ambulatory Visit: Payer: Self-pay

## 2019-03-05 ENCOUNTER — Ambulatory Visit (INDEPENDENT_AMBULATORY_CARE_PROVIDER_SITE_OTHER): Payer: BLUE CROSS/BLUE SHIELD | Admitting: Physical Therapy

## 2019-03-05 DIAGNOSIS — M6281 Muscle weakness (generalized): Secondary | ICD-10-CM | POA: Diagnosis not present

## 2019-03-05 DIAGNOSIS — M25611 Stiffness of right shoulder, not elsewhere classified: Secondary | ICD-10-CM | POA: Diagnosis not present

## 2019-03-05 DIAGNOSIS — M25511 Pain in right shoulder: Secondary | ICD-10-CM | POA: Diagnosis not present

## 2019-03-05 NOTE — Patient Instructions (Signed)
Access Code: [email or web address]  URL: [email or web address]  Date: 03/05/2019  Prepared by: Lyndee Hensen   Exercises Supine Shoulder Flexion with Dowel - 10 reps - 10 hold - 2x daily Supine Shoulder External Rotation with Dowel - 10 reps - 1 sets - 10 hold - 2x daily Standing Shoulder Extension with Dowel - 10 reps - 1 sets - 1x daily Standing Shoulder Internal Rotation Stretch with Hands Behind Back - 5 reps - 5-10 hold - 2x daily Standing Shoulder Posterior Capsule Stretch - 3 reps - 20 hold - 2x daily

## 2019-03-06 DIAGNOSIS — Z8249 Family history of ischemic heart disease and other diseases of the circulatory system: Secondary | ICD-10-CM | POA: Insufficient documentation

## 2019-03-10 ENCOUNTER — Encounter: Payer: Self-pay | Admitting: Physical Therapy

## 2019-03-10 NOTE — Therapy (Signed)
[facility] [address] [place], [place], [address] Phone: [phone]   Fax:  [phone]  Physical Therapy Evaluation  Patient Details  Name: Harry Nash MRN: [id number] Date of Birth: 1955/07/26 Referring Provider (PT): Tamera Punt   Encounter Date: 03/05/2019  PT End of Session - 03/10/19 1724    Visit Number  1    Number of Visits  16    Date for PT Re-Evaluation  04/30/19    Authorization Type  BCBS    PT Start Time  1432    PT Stop Time  1510    PT Time Calculation (min)  38 min    Activity Tolerance  Patient tolerated treatment well    Behavior During Therapy  [facility] for tasks assessed/performed       Past Medical History:  Diagnosis Date  . Depression   . Headache(784.0)   . Kidney stones     History reviewed. No pertinent surgical history.  There were no vitals filed for this visit.   Subjective Assessment - 03/10/19 1721    Subjective  Pt had shoulder surgery from Dr. Tamera Punt , now 11 weeks post op. Supraspinatus repair, bicep tenodesis, DCE, and SAD.  He did start PT post op, but then had to have hernia surgery also 3 weeks ago. Pt states he continues to have stiffness in R shoulder, unable to do overhead motions at this time. Also had bicep re-tear, MD aware.    Limitations  Lifting;Writing;House hold activities    Patient Stated Goals  improved ROM, overhead motion, return to ballet    Currently in Pain?  Yes    Pain Score  2     Pain Location  Shoulder    Pain Orientation  Right    Pain Descriptors / Indicators  Tightness    Pain Type  Acute pain    Pain Onset  More than a month ago    Pain Frequency  Intermittent    Aggravating Factors   overhead motions, cross body motions,         [facility] PT Assessment - 03/10/19 0001      Assessment   Medical Diagnosis  R shoulder Pain    Referring Provider (PT)  Chandler    Hand Dominance  Right    Prior Therapy  yes, started after surgery      Balance  Screen   Has the patient fallen in the past 6 months  No      Prior Function   Level of Independence  Independent      Cognition   Overall Cognitive Status  Within Functional Limits for tasks assessed      ROM / Strength   AROM / PROM / Strength  AROM;PROM;Strength      AROM   AROM Assessment Site  Shoulder    Right/Left Shoulder  Right    Right Shoulder Flexion  125 Degrees    Right Shoulder ABduction  130 Degrees    Right Shoulder Internal Rotation  45 Degrees    Right Shoulder External Rotation  --   wfl     PROM   PROM Assessment Site  Shoulder    Right/Left Shoulder  Right    Right Shoulder Flexion  150 Degrees    Right Shoulder ABduction  140 Degrees    Right Shoulder Internal Rotation  45 Degrees    Right Shoulder External Rotation  --   wfl     Strength   Strength Assessment  Site  Shoulder    Right/Left Shoulder  Right    Right Shoulder Flexion  3-/5    Right Shoulder ABduction  3-/5    Right Shoulder Internal Rotation  4-/5    Right Shoulder External Rotation  4-/5      Palpation   Palpation comment  well healed incisions, Hypomobile R GHJ, especially for IR >flex>abd.  Tight lat and subscap                Objective measurements completed on examination: See above findings.      [facility] Adult PT Treatment/Exercise - 03/10/19 0001      Exercises   Exercises  Shoulder      Shoulder Exercises: Supine   External Rotation  20 reps;AAROM    External Rotation Limitations  ER/IR cane    Flexion  15 reps;AAROM    Flexion Limitations  cane      Shoulder Exercises: Standing   Internal Rotation  10 reps    Internal Rotation Limitations  cane / stretch    Extension  10 reps;AAROM    Extension Limitations  cane      Shoulder Exercises: Stretch   Other Shoulder Stretches  Post shoulder stretch 20 sec 2;       Manual Therapy   Manual Therapy  Joint mobilization;Soft tissue mobilization;Passive ROM    Soft tissue mobilization  subscap release     Passive ROM  PROM, all directions for R GHJ.              PT Education - 03/10/19 1723    Education Details  PT POC, HEP, Exam findings.    Person(s) Educated  Patient    Methods  Explanation;Demonstration;Verbal cues;Handout    Comprehension  Verbalized understanding;Returned demonstration;Verbal cues required;Tactile cues required;Need further instruction       PT Short Term Goals - 03/10/19 1726      PT SHORT TERM GOAL #1   Title  He will be independent with inital HEP    Time  2    Period  Weeks    Status  New    Target Date  03/19/19      PT SHORT TERM GOAL #2   Title  Pt to demo improved PROM for IR to at least 55 deg    Time  2    Period  Weeks    Status  New    Target Date  03/19/19      PT SHORT TERM GOAL #3   Title  Pt to demo full PROM for all motions    Time  4    Period  Weeks    Status  New    Target Date  04/02/19        PT Long Term Goals - 03/10/19 1728      PT LONG TERM GOAL #1   Title  Pt to be independent with final HEP    Time  8    Period  Weeks    Status  New    Target Date  04/30/19      PT LONG TERM GOAL #2   Title  Pt to report decreased pain in R shoulder, to 0-2/10 with overhead activity and IADLs.    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    Time  8    Period  Weeks    Status  New    Target Date  04/30/19      PT LONG TERM GOAL #4   Title  Pt to demo improved strength of R shoulder to at least 4+/5 to improve ability for reaching, lifting ,carrying and IADLs.    Time  8    Period  Weeks    Status  New    Target Date  04/30/19             Plan - 03/10/19 1731    Clinical Impression Statement  Pt presents with primary complaint of increased pain in R shoulder, following surgery for RTC, bicep tenodesis, DCE and SAD. Pt with mild stiffness in joint, with lack of full PROM.  He has decreased strength, and inabilty for full AROM. He has decreased ability for full functional activities, reaching, lifting, carrying, and Housey duties, Nurse, adult. Pt to benefit from skilled PT to improve deficits and return to PLOF without pain.    Examination-Activity Limitations  Bathing;Reach Overhead;Carry;Lift    Examination-Participation Restrictions  Meal Prep;Cleaning;Community Activity;Driving;Shop;Laundry    Stability/Clinical Decision Making  Stable/Uncomplicated    Clinical Decision Making  Low    Rehab Potential  Good    PT Frequency  2x / week    PT Duration  8 weeks    PT Treatment/Interventions  ADLs/Self Care Home Management;Cryotherapy;Electrical Stimulation;Iontophoresis 4mg /ml Dexamethasone;Moist Heat;Traction;Ultrasound;Functional mobility training;Therapeutic activities;Therapeutic exercise;Balance training;Neuromuscular re-education;Patient/family education;Manual techniques;DME Instruction;Passive range of motion;Dry needling;Taping;Vasopneumatic Device;Visual/perceptual remediation/compensation;Joint Manipulations    Consulted and Agree with Plan of Care  Patient       Patient will benefit from skilled therapeutic intervention in order to improve the following deficits and impairments:  Decreased range of motion, Impaired UE functional use, Increased muscle spasms, Decreased endurance, Decreased activity tolerance, Pain, Impaired flexibility, Improper body mechanics, Hypomobility, Decreased mobility, Decreased strength  Visit Diagnosis: Acute pain of right shoulder  Stiffness of right shoulder, not elsewhere classified  Muscle weakness (generalized)     Problem List Patient Active Problem List   Diagnosis Date Noted  . Family history of heart disease 03/06/2019  . Transaminitis 02/22/2019  . Inguinal hernia of left side without obstruction or gangrene 11/27/2018  . Biceps tendon tear 04/03/2018  . Neuroma of third interspace of left foot  02/02/2018  . Diverticulitis of colon 11/08/2016  . Adrenal incidentaloma ([place]) 11/08/2016  . Tarsal tunnel syndrome of left side 11/02/2015  . Erectile dysfunction 07/23/2015  . Plantar fasciitis of left foot 04/08/2015  . Back pain 04/08/2015  . Degenerative disc disease, cervical 04/08/2015  . Nonallopathic lesion of lumbosacral region 04/08/2015  . Nonallopathic lesion-rib cage 04/08/2015  . Nonallopathic lesion of cervical region 04/08/2015  . Tibialis posterior tendon tear, nontraumatic 02/18/2015  . Routine general medical examination at a health care facility 02/10/2012  . Adjustment reaction with anxiety and depression 11/11/2011  . Migraine headache 11/08/2010  . HYPERCHOLESTEROLEMIA 08/19/2009  . COLONIC POLYPS, ADENOMATOUS 08/12/2009    Lyndee Hensen, PT, DPT 5:36 PM  03/10/19    [facility] [place], [place], [address] Phone: [phone]   Fax:  [phone]  Name: Ibaad Khoury Stopka MRN: [id number] Date of Birth: [birth year]

## 2019-03-13 ENCOUNTER — Other Ambulatory Visit: Payer: Self-pay

## 2019-03-13 ENCOUNTER — Ambulatory Visit (INDEPENDENT_AMBULATORY_CARE_PROVIDER_SITE_OTHER): Payer: BLUE CROSS/BLUE SHIELD | Admitting: Physical Therapy

## 2019-03-13 ENCOUNTER — Encounter: Payer: Self-pay | Admitting: Physical Therapy

## 2019-03-13 DIAGNOSIS — M6281 Muscle weakness (generalized): Secondary | ICD-10-CM

## 2019-03-13 DIAGNOSIS — M25511 Pain in right shoulder: Secondary | ICD-10-CM | POA: Diagnosis not present

## 2019-03-13 DIAGNOSIS — M25611 Stiffness of right shoulder, not elsewhere classified: Secondary | ICD-10-CM

## 2019-03-13 NOTE — Therapy (Signed)
[facility] [address] [place], [place], [address] Phone: [phone]   Fax:  [phone]  Physical Therapy Treatment  Patient Details  Name: Harry Nash MRN: [id number] Date of Birth: 16-Nov-1955 Referring Provider (PT): Tamera Punt   Encounter Date: 03/13/2019  PT End of Session - 03/13/19 1605    Visit Number  2    Number of Visits  16    Date for PT Re-Evaluation  04/30/19    Authorization Type  BCBS    PT Start Time  1515    PT Stop Time  1557    PT Time Calculation (min)  42 min    Activity Tolerance  Patient tolerated treatment well    Behavior During Therapy  [facility] for tasks assessed/performed       Past Medical History:  Diagnosis Date  . Depression   . Headache(784.0)   . Kidney stones     History reviewed. No pertinent surgical history.  There were no vitals filed for this visit.  Subjective Assessment - 03/13/19 1604    Subjective  DOS: 12/17/18.    Pt with no new complaints today, reports doing HEP .    Currently in Pain?  Yes    Pain Score  2     Pain Location  Shoulder    Pain Orientation  Right    Pain Descriptors / Indicators  Tightness    Pain Type  Acute pain    Pain Onset  More than a month ago    Pain Frequency  Intermittent                       [facility] Adult PT Treatment/Exercise - 03/13/19 1524      Exercises   Exercises  Shoulder      Shoulder Exercises: Supine   External Rotation  --    External Rotation Limitations  --    Flexion  --    Flexion Limitations  --      Shoulder Exercises: Standing   External Rotation  20 reps    Theraband Level (Shoulder External Rotation)  Level 2 (Red)    Internal Rotation  20 reps    Theraband Level (Shoulder Internal Rotation)  Level 2 (Red)    Internal Rotation Limitations  --    Flexion  15 reps;AROM    ABduction  15 reps;AROM    Extension  --    Extension Limitations  --    Row  20 reps    Theraband Level (Shoulder Row)   Level 3 (Green)    Other Standing Exercises  Wall push ups x20;  Wall slides x20;       Shoulder Exercises: Pulleys   Flexion  2 minutes      Shoulder Exercises: Stretch   Other Shoulder Stretches  Post shoulder stretch 20 sec 2;     Other Shoulder Stretches  IR behind back 2 hands x10;       Manual Therapy   Manual Therapy  Joint mobilization;Soft tissue mobilization;Passive ROM    Soft tissue mobilization  --    Passive ROM  PROM, all directions for R GHJ.              PT Education - 03/13/19 1605    Education Details  HEP updated    Person(s) Educated  Patient    Methods  Explanation;Demonstration;Tactile cues;Verbal cues;Handout    Comprehension  Verbalized understanding;Returned demonstration;Need further instruction;Verbal cues required  PT Short Term Goals - 03/10/19 1726      PT SHORT TERM GOAL #1   Title  He will be independent with inital HEP    Time  2    Period  Weeks    Status  New    Target Date  03/19/19      PT SHORT TERM GOAL #2   Title  Pt to demo improved PROM for IR to at least 55 deg    Time  2    Period  Weeks    Status  New    Target Date  03/19/19      PT SHORT TERM GOAL #3   Title  Pt to demo full PROM for all motions    Time  4    Period  Weeks    Status  New    Target Date  04/02/19        PT Long Term Goals - 03/10/19 1728      PT LONG TERM GOAL #1   Title  Pt to be independent with final HEP    Time  8    Period  Weeks    Status  New    Target Date  04/30/19      PT LONG TERM GOAL #2   Title  Pt to report decreased pain in R shoulder, to 0-2/10 with overhead activity and IADLs.    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    Time  8    Period  Weeks    Status  New    Target Date  04/30/19      PT LONG TERM GOAL #4   Title  Pt to demo improved strength of R shoulder to  at least 4+/5 to improve ability for reaching, lifting ,carrying and IADLs.    Time  8    Period  Weeks    Status  New    Target Date  04/30/19            Plan - 03/13/19 1607    Clinical Impression Statement  Pt with improved PROM and AROM today from last session. THer ex progressed for light strengthening and AROM, HEP updated to include. Pt with minimal pain today. Requires cueing for optimal shoulder posture with AROM.    Examination-Activity Limitations  Bathing;Reach Overhead;Carry;Lift    Examination-Participation Restrictions  Meal Prep;Cleaning;Community Activity;Driving;Shop;Laundry    Stability/Clinical Decision Making  Stable/Uncomplicated    Rehab Potential  Good    PT Frequency  2x / week    PT Duration  8 weeks    PT Treatment/Interventions  ADLs/Self Care Home Management;Cryotherapy;Electrical Stimulation;Iontophoresis 4mg /ml Dexamethasone;Moist Heat;Traction;Ultrasound;Functional mobility training;Therapeutic activities;Therapeutic exercise;Balance training;Neuromuscular re-education;Patient/family education;Manual techniques;DME Instruction;Passive range of motion;Dry needling;Taping;Vasopneumatic Device;Visual/perceptual remediation/compensation;Joint Manipulations    Consulted and Agree with Plan of Care  Patient       Patient will benefit from skilled therapeutic intervention in order to improve the following deficits and impairments:  Decreased range of motion, Impaired UE functional use, Increased muscle spasms, Decreased endurance, Decreased activity tolerance, Pain, Impaired flexibility, Improper body mechanics, Hypomobility, Decreased mobility, Decreased strength  Visit Diagnosis: Acute pain of right shoulder  Stiffness of right shoulder, not elsewhere classified  Muscle weakness (generalized)  Problem List Patient Active Problem List   Diagnosis Date Noted  . Family history of heart disease 03/06/2019  . Transaminitis 02/22/2019  . Inguinal  hernia of left side without obstruction or gangrene 11/27/2018  . Biceps tendon tear 04/03/2018  . Neuroma of third interspace of left foot 02/02/2018  . Diverticulitis of colon 11/08/2016  . Adrenal incidentaloma ([place]) 11/08/2016  . Tarsal tunnel syndrome of left side 11/02/2015  . Erectile dysfunction 07/23/2015  . Plantar fasciitis of left foot 04/08/2015  . Back pain 04/08/2015  . Degenerative disc disease, cervical 04/08/2015  . Nonallopathic lesion of lumbosacral region 04/08/2015  . Nonallopathic lesion-rib cage 04/08/2015  . Nonallopathic lesion of cervical region 04/08/2015  . Tibialis posterior tendon tear, nontraumatic 02/18/2015  . Routine general medical examination at a health care facility 02/10/2012  . Adjustment reaction with anxiety and depression 11/11/2011  . Migraine headache 11/08/2010  . HYPERCHOLESTEROLEMIA 08/19/2009  . COLONIC POLYPS, ADENOMATOUS 08/12/2009   Lyndee Hensen, PT, DPT 4:08 PM  03/13/19    [facility] [place], [place], [address] Phone: [phone]   Fax:  [phone]  Name: Maclean Was Metheney MRN: [id number] Date of Birth: May 15, 1955

## 2019-03-18 ENCOUNTER — Other Ambulatory Visit: Payer: Self-pay

## 2019-03-18 ENCOUNTER — Ambulatory Visit (INDEPENDENT_AMBULATORY_CARE_PROVIDER_SITE_OTHER): Payer: BLUE CROSS/BLUE SHIELD | Admitting: Physical Therapy

## 2019-03-18 ENCOUNTER — Encounter: Payer: Self-pay | Admitting: Physical Therapy

## 2019-03-18 DIAGNOSIS — M6281 Muscle weakness (generalized): Secondary | ICD-10-CM

## 2019-03-18 DIAGNOSIS — M25511 Pain in right shoulder: Secondary | ICD-10-CM | POA: Diagnosis not present

## 2019-03-18 DIAGNOSIS — M25611 Stiffness of right shoulder, not elsewhere classified: Secondary | ICD-10-CM

## 2019-03-18 NOTE — Therapy (Signed)
[facility] [address] [place], [place], [address] Phone: [phone]   Fax:  [phone]  Physical Therapy Treatment  Patient Details  Name: Harry Nash MRN: [id number] Date of Birth: July 18, 1955 Referring Provider (PT): Tamera Punt   Encounter Date: 03/18/2019  PT End of Session - 03/18/19 1524    Visit Number  3    Number of Visits  16    Date for PT Re-Evaluation  04/30/19    Authorization Type  BCBS    PT Start Time  1305    PT Stop Time  1344    PT Time Calculation (min)  39 min    Activity Tolerance  Patient tolerated treatment well    Behavior During Therapy  [facility] for tasks assessed/performed       Past Medical History:  Diagnosis Date  . Depression   . Headache(784.0)   . Kidney stones     History reviewed. No pertinent surgical history.  There were no vitals filed for this visit.  Subjective Assessment - 03/18/19 1524    Subjective  Pt states doing HEP, feels shoulder is moving better.    Currently in Pain?  Yes    Pain Score  2     Pain Location  Shoulder    Pain Orientation  Right    Pain Descriptors / Indicators  Aching    Pain Type  Acute pain    Pain Onset  More than a month ago    Pain Frequency  Intermittent                       [facility] Adult PT Treatment/Exercise - 03/18/19 1310      Exercises   Exercises  Shoulder      Shoulder Exercises: Standing   External Rotation  20 reps    Theraband Level (Shoulder External Rotation)  Level 2 (Red)    Internal Rotation  20 reps    Theraband Level (Shoulder Internal Rotation)  Level 2 (Red)    Flexion  15 reps;AROM    ABduction  15 reps;AROM    Row  20 reps    Theraband Level (Shoulder Row)  Level 3 (Green)    Other Standing Exercises  Wall push ups x20;        Shoulder Exercises: Pulleys   Flexion  2 minutes    ABduction  2 minutes      Shoulder Exercises: Stretch   Other Shoulder Stretches  --    Other Shoulder Stretches  IR  behind back , strap ,  x10;       Manual Therapy   Manual Therapy  Joint mobilization;Soft tissue mobilization;Passive ROM    Joint Mobilization  Post, inf glides Gr3;     Passive ROM  PROM, all directions for R GHJ.                PT Short Term Goals - 03/10/19 1726      PT SHORT TERM GOAL #1   Title  He will be independent with inital HEP    Time  2    Period  Weeks    Status  New    Target Date  03/19/19      PT SHORT TERM GOAL #2   Title  Pt to demo improved PROM for IR to at least 55 deg    Time  2    Period  Weeks    Status  New  Target Date  03/19/19      PT SHORT TERM GOAL #3   Title  Pt to demo full PROM for all motions    Time  4    Period  Weeks    Status  New    Target Date  04/02/19        PT Long Term Goals - 03/10/19 1728      PT LONG TERM GOAL #1   Title  Pt to be independent with final HEP    Time  8    Period  Weeks    Status  New    Target Date  04/30/19      PT LONG TERM GOAL #2   Title  Pt to report decreased pain in R shoulder, to 0-2/10 with overhead activity and IADLs.    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    Time  8    Period  Weeks    Status  New    Target Date  04/30/19      PT LONG TERM GOAL #4   Title  Pt to demo improved strength of R shoulder to at least 4+/5 to improve ability for reaching, lifting ,carrying and IADLs.    Time  8    Period  Weeks    Status  New    Target Date  04/30/19            Plan - 03/18/19 1525    Clinical Impression Statement  Pt improving with PROM and ability for AROM. IR at neutral and behind back is stiff and sore. COntinued education and progression of light strenghtening and ROM. Plan to progress as able.    Examination-Activity Limitations  Bathing;Reach Overhead;Carry;Lift    Examination-Participation Restrictions  Meal  Prep;Cleaning;Community Activity;Driving;Shop;Laundry    Stability/Clinical Decision Making  Stable/Uncomplicated    Rehab Potential  Good    PT Frequency  2x / week    PT Duration  8 weeks    PT Treatment/Interventions  ADLs/Self Care Home Management;Cryotherapy;Electrical Stimulation;Iontophoresis 4mg /ml Dexamethasone;Moist Heat;Traction;Ultrasound;Functional mobility training;Therapeutic activities;Therapeutic exercise;Balance training;Neuromuscular re-education;Patient/family education;Manual techniques;DME Instruction;Passive range of motion;Dry needling;Taping;Vasopneumatic Device;Visual/perceptual remediation/compensation;Joint Manipulations    Consulted and Agree with Plan of Care  Patient       Patient will benefit from skilled therapeutic intervention in order to improve the following deficits and impairments:  Decreased range of motion, Impaired UE functional use, Increased muscle spasms, Decreased endurance, Decreased activity tolerance, Pain, Impaired flexibility, Improper body mechanics, Hypomobility, Decreased mobility, Decreased strength  Visit Diagnosis: Acute pain of right shoulder  Stiffness of right shoulder, not elsewhere classified  Muscle weakness (generalized)     Problem List Patient Active Problem List   Diagnosis Date Noted  . Family history of heart disease 03/06/2019  . Transaminitis 02/22/2019  . Inguinal hernia of left side without obstruction or gangrene 11/27/2018  . Biceps tendon tear 04/03/2018  . Neuroma of third interspace of left foot 02/02/2018  . Diverticulitis of colon 11/08/2016  . Adrenal incidentaloma ([place]) 11/08/2016  . Tarsal tunnel syndrome of left side 11/02/2015  . Erectile dysfunction 07/23/2015  . Plantar fasciitis of left foot 04/08/2015  . Back pain 04/08/2015  . Degenerative disc disease, cervical 04/08/2015  . Nonallopathic lesion  of lumbosacral region 04/08/2015  . Nonallopathic lesion-rib cage 04/08/2015  . Nonallopathic  lesion of cervical region 04/08/2015  . Tibialis posterior tendon tear, nontraumatic 02/18/2015  . Routine general medical examination at a health care facility 02/10/2012  . Adjustment reaction with anxiety and depression 11/11/2011  . Migraine headache 11/08/2010  . HYPERCHOLESTEROLEMIA 08/19/2009  . COLONIC POLYPS, ADENOMATOUS 08/12/2009    Lyndee Hensen, PT, DPT 3:28 PM  03/18/19    [facility] [place], [place], [address] Phone: [phone]   Fax:  [phone]  Name: Harry Nash MRN: [id number] Date of Birth: 06-17-1955

## 2019-03-20 ENCOUNTER — Encounter: Payer: BLUE CROSS/BLUE SHIELD | Admitting: Physical Therapy

## 2019-03-25 ENCOUNTER — Other Ambulatory Visit: Payer: Self-pay

## 2019-03-25 ENCOUNTER — Ambulatory Visit (INDEPENDENT_AMBULATORY_CARE_PROVIDER_SITE_OTHER): Payer: BLUE CROSS/BLUE SHIELD | Admitting: Physical Therapy

## 2019-03-25 ENCOUNTER — Encounter: Payer: Self-pay | Admitting: Physical Therapy

## 2019-03-25 DIAGNOSIS — M25611 Stiffness of right shoulder, not elsewhere classified: Secondary | ICD-10-CM | POA: Diagnosis not present

## 2019-03-25 DIAGNOSIS — M25511 Pain in right shoulder: Secondary | ICD-10-CM

## 2019-03-25 DIAGNOSIS — M6281 Muscle weakness (generalized): Secondary | ICD-10-CM | POA: Diagnosis not present

## 2019-03-25 NOTE — Therapy (Signed)
[facility] [address] [place], [place], [address] Phone: [phone]   Fax:  [phone]  Physical Therapy Treatment  Patient Details  Name: Harry Nash MRN: [id number] Date of Birth: 11/14/1955 Referring Provider (PT): Tamera Punt   Encounter Date: 03/25/2019  PT End of Session - 03/25/19 [id number]    Visit Number  4    Number of Visits  16    Date for PT Re-Evaluation  04/30/19    Authorization Type  BCBS    PT Start Time  1432    PT Stop Time  1515    PT Time Calculation (min)  43 min    Activity Tolerance  Patient tolerated treatment well    Behavior During Therapy  [facility] for tasks assessed/performed       Past Medical History:  Diagnosis Date  . Depression   . Headache(784.0)   . Kidney stones     History reviewed. No pertinent surgical history.  There were no vitals filed for this visit.  Subjective Assessment - 03/25/19 1437    Subjective  Pt states doing well, states less pain, improving ROM.    Currently in Pain?  No/denies    Pain Score  0-No pain                       [facility] Adult PT Treatment/Exercise - 03/25/19 1436      Exercises   Exercises  Shoulder      Shoulder Exercises: Supine   Flexion  15 reps;AAROM    Shoulder Flexion Weight (lbs)  2    Flexion Limitations  cane      Shoulder Exercises: Standing   External Rotation  20 reps    Theraband Level (Shoulder External Rotation)  Level 3 (Green)    Internal Rotation  20 reps    Theraband Level (Shoulder Internal Rotation)  Level 3 (Green)    Flexion  15 reps;AROM    ABduction  15 reps;AROM    Row  20 reps    Theraband Level (Shoulder Row)  Level 4 (Blue)    Other Standing Exercises  Wall push ups x20;        Shoulder Exercises: Pulleys   Flexion  2 minutes    ABduction  --      Shoulder Exercises: Stretch   Other Shoulder Stretches  Post shoulder stretch 20 sec 2;     Other Shoulder Stretches  IR behind back , strap ,  x10;        Manual Therapy   Manual Therapy  Joint mobilization;Soft tissue mobilization;Passive ROM    Joint Mobilization  Post, inf glides Gr3; subscap release     Passive ROM  PROM, all directions for R GHJ.                PT Short Term Goals - 03/10/19 1726      PT SHORT TERM GOAL #1   Title  He will be independent with inital HEP    Time  2    Period  Weeks    Status  New    Target Date  03/19/19      PT SHORT TERM GOAL #2   Title  Pt to demo improved PROM for IR to at least 55 deg    Time  2    Period  Weeks    Status  New    Target Date  03/19/19      PT SHORT  TERM GOAL #3   Title  Pt to demo full PROM for all motions    Time  4    Period  Weeks    Status  New    Target Date  04/02/19        PT Long Term Goals - 03/10/19 1728      PT LONG TERM GOAL #1   Title  Pt to be independent with final HEP    Time  8    Period  Weeks    Status  New    Target Date  04/30/19      PT LONG TERM GOAL #2   Title  Pt to report decreased pain in R shoulder, to 0-2/10 with overhead activity and IADLs.    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    Time  8    Period  Weeks    Status  New    Target Date  04/30/19      PT LONG TERM GOAL #4   Title  Pt to demo improved strength of R shoulder to at least 4+/5 to improve ability for reaching, lifting ,carrying and IADLs.    Time  8    Period  Weeks    Status  New    Target Date  04/30/19            Plan - 03/25/19 1524    Clinical Impression Statement  Pt improving with PROM and AROM in all directions. IR still most limited. Much improvement with flexion after subscap release and manual today. Pt to benefit from continued care to restore full ROM and strength.    Examination-Activity Limitations  Bathing;Reach Overhead;Carry;Lift    Examination-Participation Restrictions   Meal Prep;Cleaning;Community Activity;Driving;Shop;Laundry    Stability/Clinical Decision Making  Stable/Uncomplicated    Rehab Potential  Good    PT Frequency  2x / week    PT Duration  8 weeks    PT Treatment/Interventions  ADLs/Self Care Home Management;Cryotherapy;Electrical Stimulation;Iontophoresis 4mg /ml Dexamethasone;Moist Heat;Traction;Ultrasound;Functional mobility training;Therapeutic activities;Therapeutic exercise;Balance training;Neuromuscular re-education;Patient/family education;Manual techniques;DME Instruction;Passive range of motion;Dry needling;Taping;Vasopneumatic Device;Visual/perceptual remediation/compensation;Joint Manipulations    Consulted and Agree with Plan of Care  Patient       Patient will benefit from skilled therapeutic intervention in order to improve the following deficits and impairments:  Decreased range of motion, Impaired UE functional use, Increased muscle spasms, Decreased endurance, Decreased activity tolerance, Pain, Impaired flexibility, Improper body mechanics, Hypomobility, Decreased mobility, Decreased strength  Visit Diagnosis: Acute pain of right shoulder  Stiffness of right shoulder, not elsewhere classified  Muscle weakness (generalized)     Problem List Patient Active Problem List   Diagnosis Date Noted  . Family history of heart disease 03/06/2019  . Transaminitis 02/22/2019  . Inguinal hernia of left side without obstruction or gangrene 11/27/2018  . Biceps tendon tear 04/03/2018  . Neuroma of third interspace of left foot 02/02/2018  . Diverticulitis of colon 11/08/2016  . Adrenal incidentaloma ([place]) 11/08/2016  . Tarsal tunnel syndrome of left side 11/02/2015  . Erectile dysfunction 07/23/2015  . Plantar fasciitis of left foot 04/08/2015  . Back pain 04/08/2015  . Degenerative disc disease, cervical 04/08/2015  . Nonallopathic lesion of lumbosacral region 04/08/2015  . Nonallopathic lesion-rib  cage 04/08/2015  .  Nonallopathic lesion of cervical region 04/08/2015  . Tibialis posterior tendon tear, nontraumatic 02/18/2015  . Routine general medical examination at a health care facility 02/10/2012  . Adjustment reaction with anxiety and depression 11/11/2011  . Migraine headache 11/08/2010  . HYPERCHOLESTEROLEMIA 08/19/2009  . COLONIC POLYPS, ADENOMATOUS 08/12/2009    Lyndee Hensen, PT, DPT 3:25 PM  03/25/19    [facility] [place], [place], [address] Phone: [phone]   Fax:  [phone]  Name: Harry Nash MRN: [id number] Date of Birth: 16-Feb-1955

## 2019-03-28 ENCOUNTER — Ambulatory Visit (INDEPENDENT_AMBULATORY_CARE_PROVIDER_SITE_OTHER): Payer: BLUE CROSS/BLUE SHIELD | Admitting: Physical Therapy

## 2019-03-28 ENCOUNTER — Other Ambulatory Visit: Payer: Self-pay

## 2019-03-28 ENCOUNTER — Encounter: Payer: Self-pay | Admitting: Physical Therapy

## 2019-03-28 DIAGNOSIS — M25611 Stiffness of right shoulder, not elsewhere classified: Secondary | ICD-10-CM | POA: Diagnosis not present

## 2019-03-28 DIAGNOSIS — M6281 Muscle weakness (generalized): Secondary | ICD-10-CM

## 2019-03-28 DIAGNOSIS — M25511 Pain in right shoulder: Secondary | ICD-10-CM | POA: Diagnosis not present

## 2019-03-31 ENCOUNTER — Encounter: Payer: Self-pay | Admitting: Physical Therapy

## 2019-03-31 NOTE — Therapy (Signed)
[facility] [address] [place], [place], [address] Phone: [phone]   Fax:  [phone]  Physical Therapy Treatment  Patient Details  Name: Harry Nash MRN: [id number] Date of Birth: 1955-07-31 Referring Provider (PT): Tamera Punt   Encounter Date: 03/28/2019  PT End of Session - 03/31/19 1938    Visit Number  5    Number of Visits  16    Date for PT Re-Evaluation  04/30/19    Authorization Type  BCBS    PT Start Time  1308    PT Stop Time  1346    PT Time Calculation (min)  38 min    Activity Tolerance  Patient tolerated treatment well    Behavior During Therapy  [facility] for tasks assessed/performed       Past Medical History:  Diagnosis Date  . Depression   . Headache(784.0)   . Kidney stones     History reviewed. No pertinent surgical history.  There were no vitals filed for this visit.  Subjective Assessment - 03/31/19 1937    Subjective  Pt with no new complaints, doing well.    Currently in Pain?  No/denies    Pain Score  0-No pain         [facility] PT Assessment - 03/31/19 0001      AROM   Overall AROM Comments  L shoulder flexion: 145    Right Shoulder Flexion  142 Degrees    Right Shoulder ABduction  145 Degrees      PROM   Right Shoulder Flexion  155 Degrees                   [facility] Adult PT Treatment/Exercise - 03/31/19 0001      Exercises   Exercises  Shoulder      Shoulder Exercises: Supine   External Rotation  20 reps    External Rotation Weight (lbs)  2    Flexion  15 reps;AAROM    Shoulder Flexion Weight (lbs)  2    Flexion Limitations  cane      Shoulder Exercises: Standing   External Rotation  20 reps    Theraband Level (Shoulder External Rotation)  Level 3 (Green)    Internal Rotation  20 reps    Theraband Level (Shoulder Internal Rotation)  Level 3 (Green)    Flexion  15 reps;AROM    ABduction  15 reps;AROM    Row  20 reps    Theraband Level (Shoulder Row)  Level 4  (Blue)    Other Standing Exercises  Wall push ups x20;        Shoulder Exercises: Pulleys   Flexion  2 minutes    ABduction  1 minute      Shoulder Exercises: Stretch   Corner Stretch  5 reps;10 seconds    Corner Stretch Limitations  doorway 45 and 90 deg    Other Shoulder Stretches  Post shoulder stretch 20 sec 2;     Other Shoulder Stretches  IR behind back , 2 hands  x10; stick: extension and adduction for IR x10 each;       Manual Therapy   Manual Therapy  Joint mobilization;Soft tissue mobilization;Passive ROM    Joint Mobilization  Post, inf glides Gr3;    Passive ROM  PROM, all directions for R GHJ.                PT Short Term Goals - 03/10/19 1726  PT SHORT TERM GOAL #1   Title  He will be independent with inital HEP    Time  2    Period  Weeks    Status  New    Target Date  03/19/19      PT SHORT TERM GOAL #2   Title  Pt to demo improved PROM for IR to at least 55 deg    Time  2    Period  Weeks    Status  New    Target Date  03/19/19      PT SHORT TERM GOAL #3   Title  Pt to demo full PROM for all motions    Time  4    Period  Weeks    Status  New    Target Date  04/02/19        PT Long Term Goals - 03/10/19 1728      PT LONG TERM GOAL #1   Title  Pt to be independent with final HEP    Time  8    Period  Weeks    Status  New    Target Date  04/30/19      PT LONG TERM GOAL #2   Title  Pt to report decreased pain in R shoulder, to 0-2/10 with overhead activity and IADLs.    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    Time  8    Period  Weeks    Status  New    Target Date  04/30/19      PT LONG TERM GOAL #4   Title  Pt to demo improved strength of R shoulder to at least 4+/5 to improve ability for reaching, lifting ,carrying and IADLs.    Time  8    Period  Weeks    Status  New    Target  Date  04/30/19            Plan - 03/31/19 1940    Clinical Impression Statement  Pt progressing well , with PROM and AROM. Still limited for IR and IR behind back. Pt improving with strength and ability for repeated AROM with less compensation. Pt to benefit from continued care.    Examination-Activity Limitations  Bathing;Reach Overhead;Carry;Lift    Examination-Participation Restrictions  Meal Prep;Cleaning;Community Activity;Driving;Shop;Laundry    Stability/Clinical Decision Making  Stable/Uncomplicated    Rehab Potential  Good    PT Frequency  2x / week    PT Duration  8 weeks    PT Treatment/Interventions  ADLs/Self Care Home Management;Cryotherapy;Electrical Stimulation;Iontophoresis 4mg /ml Dexamethasone;Moist Heat;Traction;Ultrasound;Functional mobility training;Therapeutic activities;Therapeutic exercise;Balance training;Neuromuscular re-education;Patient/family education;Manual techniques;DME Instruction;Passive range of motion;Dry needling;Taping;Vasopneumatic Device;Visual/perceptual remediation/compensation;Joint Manipulations    Consulted and Agree with Plan of Care  Patient       Patient will benefit from skilled therapeutic intervention in order to improve the following deficits and impairments:  Decreased range of motion, Impaired UE functional use, Increased muscle spasms, Decreased endurance, Decreased activity tolerance, Pain, Impaired flexibility, Improper body mechanics, Hypomobility, Decreased mobility, Decreased strength  Visit Diagnosis: Acute pain of right shoulder  Stiffness of right shoulder, not elsewhere classified  Muscle weakness (generalized)     Problem List Patient Active Problem List   Diagnosis Date Noted  .  Family history of heart disease 03/06/2019  . Transaminitis 02/22/2019  . Inguinal hernia of left side without obstruction or gangrene 11/27/2018  . Biceps tendon tear 04/03/2018  . Neuroma of third interspace of left foot 02/02/2018   . Diverticulitis of colon 11/08/2016  . Adrenal incidentaloma ([place]) 11/08/2016  . Tarsal tunnel syndrome of left side 11/02/2015  . Erectile dysfunction 07/23/2015  . Plantar fasciitis of left foot 04/08/2015  . Back pain 04/08/2015  . Degenerative disc disease, cervical 04/08/2015  . Nonallopathic lesion of lumbosacral region 04/08/2015  . Nonallopathic lesion-rib cage 04/08/2015  . Nonallopathic lesion of cervical region 04/08/2015  . Tibialis posterior tendon tear, nontraumatic 02/18/2015  . Routine general medical examination at a health care facility 02/10/2012  . Adjustment reaction with anxiety and depression 11/11/2011  . Migraine headache 11/08/2010  . HYPERCHOLESTEROLEMIA 08/19/2009  . COLONIC POLYPS, ADENOMATOUS 08/12/2009   Lyndee Hensen, PT, DPT 7:41 PM  03/31/19   [facility] [place], [place], [address] Phone: [phone]   Fax:  [phone]  Name: Harry Nash MRN: [id number] Date of Birth: 23-Jul-1955

## 2019-04-01 ENCOUNTER — Encounter: Payer: BLUE CROSS/BLUE SHIELD | Admitting: Physical Therapy

## 2019-04-01 ENCOUNTER — Encounter: Payer: Self-pay | Admitting: Physical Therapy

## 2019-04-01 ENCOUNTER — Other Ambulatory Visit: Payer: Self-pay

## 2019-04-01 ENCOUNTER — Ambulatory Visit (INDEPENDENT_AMBULATORY_CARE_PROVIDER_SITE_OTHER): Payer: BLUE CROSS/BLUE SHIELD | Admitting: Physical Therapy

## 2019-04-01 DIAGNOSIS — M25611 Stiffness of right shoulder, not elsewhere classified: Secondary | ICD-10-CM

## 2019-04-01 DIAGNOSIS — M6281 Muscle weakness (generalized): Secondary | ICD-10-CM | POA: Diagnosis not present

## 2019-04-01 DIAGNOSIS — M25511 Pain in right shoulder: Secondary | ICD-10-CM

## 2019-04-01 NOTE — Therapy (Signed)
[facility] [address] [place], [place], [address] Phone: [phone]   Fax:  [phone]  Physical Therapy Treatment  Patient Details  Name: Harry Nash MRN: [id number] Date of Birth: [birth year] Referring Provider (PT): Tamera Punt   Encounter Date: 04/01/2019  PT End of Session - 04/01/19 1447    Visit Number  6    Number of Visits  16    Date for PT Re-Evaluation  04/30/19    Authorization Type  BCBS    PT Start Time  1435    PT Stop Time  1515    PT Time Calculation (min)  40 min    Activity Tolerance  Patient tolerated treatment well    Behavior During Therapy  [facility] for tasks assessed/performed       Past Medical History:  Diagnosis Date  . Depression   . Headache(784.0)   . Kidney stones     History reviewed. No pertinent surgical history.  There were no vitals filed for this visit.  Subjective Assessment - 04/01/19 1446    Subjective  Pt notes much improvment, states doing daily activities without much difficulty    Currently in Pain?  No/denies    Pain Score  0-No pain                       [facility] Adult PT Treatment/Exercise - 04/01/19 1438      Exercises   Exercises  Shoulder      Shoulder Exercises: Supine   External Rotation  20 reps    External Rotation Weight (lbs)  2    Flexion  15 reps;AAROM    Shoulder Flexion Weight (lbs)  2    Flexion Limitations  cane      Shoulder Exercises: Standing   External Rotation  20 reps    Theraband Level (Shoulder External Rotation)  Level 3 (Green)    Internal Rotation  20 reps    Theraband Level (Shoulder Internal Rotation)  Level 3 (Green)    Flexion  AROM;20 reps    Shoulder Flexion Weight (lbs)  2    ABduction  AROM;20 reps    Shoulder ABduction Weight (lbs)  2    Row  20 reps    Theraband Level (Shoulder Row)  Level 4 (Blue)    Other Standing Exercises  Wall push ups x20;      Other Standing Exercises  UE lift offs x10       Shoulder  Exercises: Pulleys   Flexion  --    ABduction  --      Shoulder Exercises: ROM/Strengthening   UBE (Upper Arm Bike)  4 min, fwd/bwd      Shoulder Exercises: Stretch   Corner Stretch  5 reps;10 seconds    Corner Stretch Limitations  doorway 45 and 90 deg    Other Shoulder Stretches  Post shoulder stretch 20 sec 2;     Other Shoulder Stretches  IR behind back , 2 hands  x10; stick: extension and adduction for IR x10 each;       Manual Therapy   Manual Therapy  Joint mobilization;Soft tissue mobilization;Passive ROM    Joint Mobilization  Post, inf glides Gr3;    Passive ROM  PROM, all directions for R GHJ.                PT Short Term Goals - 03/10/19 1726      PT SHORT TERM GOAL #1  Title  He will be independent with inital HEP    Time  2    Period  Weeks    Status  New    Target Date  03/19/19      PT SHORT TERM GOAL #2   Title  Pt to demo improved PROM for IR to at least 55 deg    Time  2    Period  Weeks    Status  New    Target Date  03/19/19      PT SHORT TERM GOAL #3   Title  Pt to demo full PROM for all motions    Time  4    Period  Weeks    Status  New    Target Date  04/02/19        PT Long Term Goals - 03/10/19 1728      PT LONG TERM GOAL #1   Title  Pt to be independent with final HEP    Time  8    Period  Weeks    Status  New    Target Date  04/30/19      PT LONG TERM GOAL #2   Title  Pt to report decreased pain in R shoulder, to 0-2/10 with overhead activity and IADLs.    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    Time  8    Period  Weeks    Status  New    Target Date  04/30/19      PT LONG TERM GOAL #4   Title  Pt to demo improved strength of R shoulder to at least 4+/5 to improve ability for reaching, lifting ,carrying and IADLs.    Time  8    Period  Weeks    Status  New    Target  Date  04/30/19            Plan - 04/01/19 1448    Clinical Impression Statement  Pt progressing well, able to progress ther ex without increased pain. Pt to benefit from continued care, with focus on regaining full ROM, and strengthening for overhead, reaching, carrying, and IADLS.    Examination-Activity Limitations  Bathing;Reach Overhead;Carry;Lift    Examination-Participation Restrictions  Meal Prep;Cleaning;Community Activity;Driving;Shop;Laundry    Stability/Clinical Decision Making  Stable/Uncomplicated    Rehab Potential  Good    PT Frequency  2x / week    PT Duration  8 weeks    PT Treatment/Interventions  ADLs/Self Care Home Management;Cryotherapy;Electrical Stimulation;Iontophoresis 4mg /ml Dexamethasone;Moist Heat;Traction;Ultrasound;Functional mobility training;Therapeutic activities;Therapeutic exercise;Balance training;Neuromuscular re-education;Patient/family education;Manual techniques;DME Instruction;Passive range of motion;Dry needling;Taping;Vasopneumatic Device;Visual/perceptual remediation/compensation;Joint Manipulations    Consulted and Agree with Plan of Care  Patient       Patient will benefit from skilled therapeutic intervention in order to improve the following deficits and impairments:  Decreased range of motion, Impaired UE functional use, Increased muscle spasms, Decreased endurance, Decreased activity tolerance, Pain, Impaired flexibility, Improper body mechanics, Hypomobility, Decreased mobility, Decreased strength  Visit Diagnosis: Acute pain of right shoulder  Stiffness of right shoulder, not elsewhere classified  Muscle weakness (generalized)     Problem List Patient Active Problem List   Diagnosis Date Noted  . Family history of heart disease 03/06/2019  .  Transaminitis 02/22/2019  . Inguinal hernia of left side without obstruction or gangrene 11/27/2018  . Biceps tendon tear 04/03/2018  . Neuroma of third interspace of left foot  02/02/2018  . Diverticulitis of colon 11/08/2016  . Adrenal incidentaloma ([place]) 11/08/2016  . Tarsal tunnel syndrome of left side 11/02/2015  . Erectile dysfunction 07/23/2015  . Plantar fasciitis of left foot 04/08/2015  . Back pain 04/08/2015  . Degenerative disc disease, cervical 04/08/2015  . Nonallopathic lesion of lumbosacral region 04/08/2015  . Nonallopathic lesion-rib cage 04/08/2015  . Nonallopathic lesion of cervical region 04/08/2015  . Tibialis posterior tendon tear, nontraumatic 02/18/2015  . Routine general medical examination at a health care facility 02/10/2012  . Adjustment reaction with anxiety and depression 11/11/2011  . Migraine headache 11/08/2010  . HYPERCHOLESTEROLEMIA 08/19/2009  . COLONIC POLYPS, ADENOMATOUS 08/12/2009    Lyndee Hensen, PT, DPT 9:14 PM  04/01/19    [facility] [place], [place], [address] Phone: [phone]   Fax:  [phone]  Name: Harry Nash MRN: [id number] Date of Birth: 18-May-1955

## 2019-04-02 ENCOUNTER — Encounter: Payer: BLUE CROSS/BLUE SHIELD | Admitting: Physical Therapy

## 2019-04-04 ENCOUNTER — Other Ambulatory Visit: Payer: Self-pay

## 2019-04-04 ENCOUNTER — Ambulatory Visit (INDEPENDENT_AMBULATORY_CARE_PROVIDER_SITE_OTHER): Payer: BLUE CROSS/BLUE SHIELD | Admitting: Physical Therapy

## 2019-04-04 ENCOUNTER — Encounter: Payer: Self-pay | Admitting: Physical Therapy

## 2019-04-04 DIAGNOSIS — M6281 Muscle weakness (generalized): Secondary | ICD-10-CM | POA: Diagnosis not present

## 2019-04-04 DIAGNOSIS — M25511 Pain in right shoulder: Secondary | ICD-10-CM

## 2019-04-04 DIAGNOSIS — M25611 Stiffness of right shoulder, not elsewhere classified: Secondary | ICD-10-CM | POA: Diagnosis not present

## 2019-04-04 NOTE — Therapy (Signed)
[facility] [address] [place], [place], [address] Phone: [phone]   Fax:  [phone]  Physical Therapy Treatment  Patient Details  Name: Harry Nash MRN: [id number] Date of Birth: 1955-11-16 Referring Provider (PT): Tamera Punt   Encounter Date: 04/04/2019  PT End of Session - 04/04/19 1448    Visit Number  7    Number of Visits  16    Date for PT Re-Evaluation  04/30/19    Authorization Type  BCBS    PT Start Time  1432    PT Stop Time  1515    PT Time Calculation (min)  43 min    Activity Tolerance  Patient tolerated treatment well    Behavior During Therapy  [facility] for tasks assessed/performed       Past Medical History:  Diagnosis Date  . Depression   . Headache(784.0)   . Kidney stones     History reviewed. No pertinent surgical history.  There were no vitals filed for this visit.  Subjective Assessment - 04/04/19 1448    Subjective  Pt with no new complaints today    Currently in Pain?  No/denies    Pain Score  0-No pain                       [facility] Adult PT Treatment/Exercise - 04/04/19 1436      Exercises   Exercises  Shoulder      Shoulder Exercises: Supine   Horizontal ABduction  15 reps;AROM    External Rotation  20 reps    External Rotation Weight (lbs)  3    Flexion  15 reps;AAROM    Shoulder Flexion Weight (lbs)  --    Flexion Limitations  --    Other Supine Exercises  Chest press 4 lb bil;       Shoulder Exercises: Standing   External Rotation  --    Theraband Level (Shoulder External Rotation)  --    Internal Rotation  --    Theraband Level (Shoulder Internal Rotation)  --    Flexion  AROM;20 reps    Shoulder Flexion Weight (lbs)  2    ABduction  AROM;20 reps    Shoulder ABduction Weight (lbs)  2    Row  20 reps    Theraband Level (Shoulder Row)  Level 4 (Blue)    Other Standing Exercises  Wall push ups x20;      Other Standing Exercises  --      Shoulder Exercises:  ROM/Strengthening   UBE (Upper Arm Bike)  5 min, fwd/bwd      Shoulder Exercises: Stretch   Corner Stretch  5 reps;10 seconds    Corner Stretch Limitations  doorway 45 and 90 deg    Other Shoulder Stretches  --    Other Shoulder Stretches  IR behind back , 2 hands  x10; stick: extension and adduction for IR x10 each;       Manual Therapy   Manual Therapy  Joint mobilization;Soft tissue mobilization;Passive ROM    Joint Mobilization  Post, inf glides Gr3;    Passive ROM  PROM, all directions for R GHJ.                PT Short Term Goals - 04/04/19 1459      PT SHORT TERM GOAL #1   Title  He will be independent with inital HEP    Time  2    Period  Weeks    Status  Achieved    Target Date  03/19/19      PT SHORT TERM GOAL #2   Title  Pt to demo improved PROM for IR to at least 55 deg    Time  2    Period  Weeks    Status  Achieved    Target Date  03/19/19      PT SHORT TERM GOAL #3   Title  Pt to demo full PROM for all motions    Time  4    Period  Weeks    Status  Partially Met    Target Date  04/02/19        PT Long Term Goals - 03/10/19 1728      PT LONG TERM GOAL #1   Title  Pt to be independent with final HEP    Time  8    Period  Weeks    Status  New    Target Date  04/30/19      PT LONG TERM GOAL #2   Title  Pt to report decreased pain in R shoulder, to 0-2/10 with overhead activity and IADLs.    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    Time  8    Period  Weeks    Status  New    Target Date  04/30/19      PT LONG TERM GOAL #4   Title  Pt to demo improved strength of R shoulder to at least 4+/5 to improve ability for reaching, lifting ,carrying and IADLs.    Time  8    Period  Weeks    Status  New    Target Date  04/30/19            Plan - 04/04/19 1522    Clinical Impression Statement   Pt progressing very well. Improved PROM and AROM today. Improving strength with ther ex. Plan to see pt 1-2 more weeks and progress to d/c .    Examination-Activity Limitations  Bathing;Reach Overhead;Carry;Lift    Examination-Participation Restrictions  Meal Prep;Cleaning;Community Activity;Driving;Shop;Laundry    Stability/Clinical Decision Making  Stable/Uncomplicated    Rehab Potential  Good    PT Frequency  2x / week    PT Duration  8 weeks    PT Treatment/Interventions  ADLs/Self Care Home Management;Cryotherapy;Electrical Stimulation;Iontophoresis '4mg'$ /ml Dexamethasone;Moist Heat;Traction;Ultrasound;Functional mobility training;Therapeutic activities;Therapeutic exercise;Balance training;Neuromuscular re-education;Patient/family education;Manual techniques;DME Instruction;Passive range of motion;Dry needling;Taping;Vasopneumatic Device;Visual/perceptual remediation/compensation;Joint Manipulations    Consulted and Agree with Plan of Care  Patient       Patient will benefit from skilled therapeutic intervention in order to improve the following deficits and impairments:  Decreased range of motion, Impaired UE functional use, Increased muscle spasms, Decreased endurance, Decreased activity tolerance, Pain, Impaired flexibility, Improper body mechanics, Hypomobility, Decreased mobility, Decreased strength  Visit Diagnosis: Acute pain of right shoulder  Stiffness of right shoulder, not elsewhere classified  Muscle weakness (generalized)     Problem List Patient Active Problem List   Diagnosis Date Noted  . Family history of heart disease 03/06/2019  . Transaminitis 02/22/2019  . Inguinal hernia of left side without obstruction or gangrene 11/27/2018  . Biceps tendon tear 04/03/2018  . Neuroma of  third interspace of left foot 02/02/2018  . Diverticulitis of colon 11/08/2016  . Adrenal incidentaloma ([place]) 11/08/2016  . Tarsal tunnel syndrome of left side 11/02/2015  . Erectile  dysfunction 07/23/2015  . Plantar fasciitis of left foot 04/08/2015  . Back pain 04/08/2015  . Degenerative disc disease, cervical 04/08/2015  . Nonallopathic lesion of lumbosacral region 04/08/2015  . Nonallopathic lesion-rib cage 04/08/2015  . Nonallopathic lesion of cervical region 04/08/2015  . Tibialis posterior tendon tear, nontraumatic 02/18/2015  . Routine general medical examination at a health care facility 02/10/2012  . Adjustment reaction with anxiety and depression 11/11/2011  . Migraine headache 11/08/2010  . HYPERCHOLESTEROLEMIA 08/19/2009  . COLONIC POLYPS, ADENOMATOUS 08/12/2009    Lyndee Hensen, PT, DPT 3:23 PM  04/04/19    [facility] [place], [place], [address] Phone: [phone]   Fax:  [phone]  Name: Harry Nash MRN: [id number] Date of Birth: 1955/02/17

## 2019-04-08 ENCOUNTER — Ambulatory Visit (INDEPENDENT_AMBULATORY_CARE_PROVIDER_SITE_OTHER): Payer: BLUE CROSS/BLUE SHIELD | Admitting: Physical Therapy

## 2019-04-08 ENCOUNTER — Encounter: Payer: Self-pay | Admitting: Physical Therapy

## 2019-04-08 ENCOUNTER — Other Ambulatory Visit: Payer: Self-pay

## 2019-04-08 DIAGNOSIS — M6281 Muscle weakness (generalized): Secondary | ICD-10-CM | POA: Diagnosis not present

## 2019-04-08 DIAGNOSIS — M25511 Pain in right shoulder: Secondary | ICD-10-CM | POA: Diagnosis not present

## 2019-04-08 DIAGNOSIS — M25611 Stiffness of right shoulder, not elsewhere classified: Secondary | ICD-10-CM | POA: Diagnosis not present

## 2019-04-08 NOTE — Therapy (Signed)
[facility] [address] [place], [place], [address] Phone: [phone]   Fax:  [phone]  Physical Therapy Treatment  Patient Details  Name: Harry Nash MRN: [id number] Date of Birth: 09-16-1955 Referring Provider (PT): Tamera Punt   Encounter Date: 04/08/2019  PT End of Session - 04/08/19 3614    Visit Number  8    Number of Visits  16    Date for PT Re-Evaluation  04/30/19    Authorization Type  [employer]    PT Start Time  1303    PT Stop Time  1341    PT Time Calculation (min)  38 min    Activity Tolerance  Patient tolerated treatment well    Behavior During Therapy  [facility] for tasks assessed/performed       Past Medical History:  Diagnosis Date  . Depression   . Headache(784.0)   . Kidney stones     History reviewed. No pertinent surgical history.  There were no vitals filed for this visit.  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    Currently in Pain?  No/denies    Pain Score  0-No pain         [facility] PT Assessment - 04/08/19 0001      AROM   Overall AROM Comments  IR behind back: to L1      PROM   Right Shoulder Flexion  160 Degrees    Right Shoulder ABduction  165 Degrees    Right Shoulder Internal Rotation  --   wnl   Right Shoulder External Rotation  --   wnl     Strength   Right Shoulder Flexion  4+/5    Right Shoulder ABduction  4+/5    Right Shoulder Internal Rotation  5/5    Right Shoulder External Rotation  4+/5                   [facility] Adult PT Treatment/Exercise - 04/08/19 1306      Exercises   Exercises  Shoulder      Shoulder Exercises: Supine   Horizontal ABduction  --    External Rotation  --    External Rotation Weight (lbs)  --    Flexion  --    Other Supine Exercises  --      Shoulder Exercises: Sidelying   External Rotation  20 reps    External Rotation  Weight (lbs)  3      Shoulder Exercises: Standing   External Rotation  20 reps    Theraband Level (Shoulder External Rotation)  Level 3 (Green)    Internal Rotation  20 reps    Theraband Level (Shoulder Internal Rotation)  Level 3 (Green)    Flexion  AROM;20 reps    Shoulder Flexion Weight (lbs)  2    ABduction  AROM;20 reps    Shoulder ABduction Weight (lbs)  2    Row  20 reps    Theraband Level (Shoulder Row)  Level 4 (Blue)    Other Standing Exercises  Wall push ups x20;        Shoulder Exercises: ROM/Strengthening   UBE (Upper Arm Bike)  5 min, fwd/bwd      Shoulder Exercises: Stretch   Corner Stretch  5 reps;10 seconds    Corner Stretch Limitations  doorway 45 and 90 deg  Internal Rotation Stretch  5 reps    Internal Rotation Stretch Limitations  behind back     Other Shoulder Stretches  --      Manual Therapy   Manual Therapy  Joint mobilization;Soft tissue mobilization;Passive ROM    Joint Mobilization  Post, inf glides Gr3;    Passive ROM  PROM, all directions for R GHJ.                PT Short Term Goals - 04/04/19 1459      PT SHORT TERM GOAL #1   Title  He will be independent with inital HEP    Time  2    Period  Weeks    Status  Achieved    Target Date  03/19/19      PT SHORT TERM GOAL #2   Title  Pt to demo improved PROM for IR to at least 55 deg    Time  2    Period  Weeks    Status  Achieved    Target Date  03/19/19      PT SHORT TERM GOAL #3   Title  Pt to demo full PROM for all motions    Time  4    Period  Weeks    Status  Partially Met    Target Date  04/02/19        PT Long Term Goals - 03/10/19 1728      PT LONG TERM GOAL #1   Title  Pt to be independent with final HEP    Time  8    Period  Weeks    Status  New    Target Date  04/30/19      PT LONG TERM GOAL #2   Title  Pt to report decreased pain in R shoulder, to 0-2/10 with overhead activity and IADLs.    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    Time  8    Period  Weeks    Status  New    Target Date  04/30/19      PT LONG TERM GOAL #4   Title  Pt to demo improved strength of R shoulder to at least 4+/5 to improve ability for reaching, lifting ,carrying and IADLs.    Time  8    Period  Weeks    Status  New    Target Date  04/30/19            Plan - 04/08/19 1345    Clinical Impression Statement  Pt with much improved PROM and strength measurements. Progressing very well, plan to d/c next visit. Pt with much improved ability for functional activity with UEs at home. Will finalize HEP next visit.    Examination-Activity Limitations  Bathing;Reach Overhead;Carry;Lift    Examination-Participation Restrictions  Meal Prep;Cleaning;Community Activity;Driving;Shop;Laundry    Stability/Clinical Decision Making  Stable/Uncomplicated    Rehab Potential  Good    PT Frequency  2x / week    PT Duration  8 weeks    PT Treatment/Interventions  ADLs/Self Care Home Management;Cryotherapy;Electrical Stimulation;Iontophoresis '4mg'$ /ml Dexamethasone;Moist Heat;Traction;Ultrasound;Functional mobility training;Therapeutic activities;Therapeutic exercise;Balance training;Neuromuscular re-education;Patient/family education;Manual techniques;DME Instruction;Passive range of motion;Dry needling;Taping;Vasopneumatic Device;Visual/perceptual remediation/compensation;Joint Manipulations    Consulted and Agree  with Plan of Care  Patient       Patient will benefit from skilled therapeutic intervention in order to improve the following deficits and impairments:  Decreased range of motion, Impaired UE functional use, Increased muscle spasms, Decreased endurance, Decreased activity tolerance, Pain, Impaired flexibility, Improper body mechanics, Hypomobility, Decreased mobility, Decreased strength  Visit Diagnosis: Acute pain of right  shoulder  Stiffness of right shoulder, not elsewhere classified  Muscle weakness (generalized)     Problem List Patient Active Problem List   Diagnosis Date Noted  . Family history of heart disease 03/06/2019  . Transaminitis 02/22/2019  . Inguinal hernia of left side without obstruction or gangrene 11/27/2018  . Biceps tendon tear 04/03/2018  . Neuroma of third interspace of left foot 02/02/2018  . Diverticulitis of colon 11/08/2016  . Adrenal incidentaloma ([place]) 11/08/2016  . Tarsal tunnel syndrome of left side 11/02/2015  . Erectile dysfunction 07/23/2015  . Plantar fasciitis of left foot 04/08/2015  . Back pain 04/08/2015  . Degenerative disc disease, cervical 04/08/2015  . Nonallopathic lesion of lumbosacral region 04/08/2015  . Nonallopathic lesion-rib cage 04/08/2015  . Nonallopathic lesion of cervical region 04/08/2015  . Tibialis posterior tendon tear, nontraumatic 02/18/2015  . Routine general medical examination at a health care facility 02/10/2012  . Adjustment reaction with anxiety and depression 11/11/2011  . Migraine headache 11/08/2010  . HYPERCHOLESTEROLEMIA 08/19/2009  . COLONIC POLYPS, ADENOMATOUS 08/12/2009    Lyndee Hensen, PT, DPT 1:46 PM  04/08/19    [facility] [place], [place], [address] Phone: [phone]   Fax:  [phone]  Name: Harry Nash MRN: [id number] Date of Birth: 1955-11-19

## 2019-04-11 ENCOUNTER — Ambulatory Visit (INDEPENDENT_AMBULATORY_CARE_PROVIDER_SITE_OTHER): Payer: BLUE CROSS/BLUE SHIELD | Admitting: Physical Therapy

## 2019-04-11 ENCOUNTER — Encounter: Payer: Self-pay | Admitting: Physical Therapy

## 2019-04-11 DIAGNOSIS — M25611 Stiffness of right shoulder, not elsewhere classified: Secondary | ICD-10-CM | POA: Diagnosis not present

## 2019-04-11 DIAGNOSIS — M6281 Muscle weakness (generalized): Secondary | ICD-10-CM

## 2019-04-11 DIAGNOSIS — M25511 Pain in right shoulder: Secondary | ICD-10-CM

## 2019-04-11 NOTE — Therapy (Signed)
[facility] [address] [place], [place], [address] Phone: [phone]   Fax:  [phone]  Physical Therapy Treatment/Discharge   Patient Details  Name: Harry Nash MRN: [id number] Date of Birth: 06-19-1955 Referring Provider (PT): Tamera Punt   Encounter Date: 04/11/2019  PT End of Session - 04/11/19 1401    Visit Number  9    Number of Visits  16    Date for PT Re-Evaluation  04/30/19    Authorization Type  [employer]    PT Start Time  1302    PT Stop Time  1340    PT Time Calculation (min)  38 min    Activity Tolerance  Patient tolerated treatment well    Behavior During Therapy  [facility] for tasks assessed/performed       Past Medical History:  Diagnosis Date  . Depression   . Headache(784.0)   . Kidney stones     History reviewed. No pertinent surgical history.  There were no vitals filed for this visit.  Subjective Assessment - 04/11/19 1359    Subjective  Pt wtih no new complaints. Doing well with HEP.    Currently in Pain?  No/denies    Pain Score  0-No pain         [facility] PT Assessment - 04/11/19 0001      AROM   Right Shoulder Flexion  160 Degrees    Right Shoulder ABduction  165 Degrees    Right Shoulder Internal Rotation  --   wnl   Right Shoulder External Rotation  --   wnl                  [facility] Adult PT Treatment/Exercise - 04/11/19 1309      Exercises   Exercises  Shoulder      Shoulder Exercises: Supine   External Rotation  10 reps    External Rotation Limitations  at 90/90     Flexion  15 reps;AAROM    Flexion Limitations  cane      Shoulder Exercises: Sidelying   External Rotation  20 reps    External Rotation Weight (lbs)  3      Shoulder Exercises: Standing   External Rotation  20 reps    Theraband Level (Shoulder External Rotation)  Level 3 (Green)    Internal Rotation  20 reps    Theraband Level (Shoulder Internal Rotation)  Level 3 (Green)    Flexion  AROM;20 reps    Shoulder Flexion Weight (lbs)  2    ABduction  AROM;20 reps    Shoulder ABduction Weight (lbs)  2    Row  20 reps    Theraband Level (Shoulder Row)  Level 4 (Blue)    Other Standing Exercises  Wall push ups x20;      Other Standing Exercises  90/90 tennis ball taps x20;   light throwing , overhand, with review of mechanics x15;       Shoulder Exercises: ROM/Strengthening   UBE (Upper Arm Bike)  5 min, fwd/bwd      Shoulder Exercises: Stretch   Corner Stretch  --    Corner Stretch Limitations  --    Internal Rotation Stretch  5 reps    Internal Rotation Stretch Limitations  behind back       Manual Therapy   Manual Therapy  Joint mobilization;Soft tissue mobilization;Passive ROM    Joint Mobilization  --    Passive ROM  PROM, all directions for R  GHJ.              PT Education - 04/11/19 1401    Education Details  Final HEP reviewed    Person(s) Educated  Patient    Methods  Explanation;Demonstration;Verbal cues;Handout    Comprehension  Verbalized understanding;Returned demonstration       PT Short Term Goals - 04/11/19 1402      PT SHORT TERM GOAL #1   Title  He will be independent with inital HEP    Time  2    Period  Weeks    Status  Achieved    Target Date  03/19/19      PT SHORT TERM GOAL #2   Title  Pt to demo improved PROM for IR to at least 55 deg    Time  2    Period  Weeks    Status  Achieved    Target Date  03/19/19      PT SHORT TERM GOAL #3   Title  Pt to demo full PROM for all motions    Time  4    Period  Weeks    Status  Achieved    Target Date  04/02/19        PT Long Term Goals - 04/11/19 1402      PT LONG TERM GOAL #1   Title  Pt to be independent with final HEP    Time  8    Period  Weeks    Status  Achieved      PT LONG TERM GOAL #2   Title  Pt to report decreased pain in R shoulder, to 0-2/10 with overhead activity and IADLs.    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    Time  8    Period  Weeks    Status  Achieved      PT LONG TERM GOAL #4   Title  Pt to demo improved strength of R shoulder to at least 4+/5 to improve ability for reaching, lifting ,carrying and IADLs.    Time  8    Period  Weeks    Status  Achieved            Plan - 04/11/19 1403    Clinical Impression Statement  Pt has made very good progress. ROM now WNL, without pain, and with much improved strength. Pt able to return to work, teaching dance, with repeated motoins for shoulder AROM without difficulty. Final HEP reviwed in detial today, pt with good understanding.    Examination-Activity Limitations  Bathing;Reach Overhead;Carry;Lift    Examination-Participation Restrictions  Meal Prep;Cleaning;Community Activity;Driving;Shop;Laundry    Stability/Clinical Decision Making  Stable/Uncomplicated    Rehab Potential  Good    PT Frequency  2x / week    PT Duration  8 weeks    PT Treatment/Interventions  ADLs/Self Care Home Management;Cryotherapy;Electrical Stimulation;Iontophoresis '4mg'$ /ml Dexamethasone;Moist Heat;Traction;Ultrasound;Functional mobility training;Therapeutic activities;Therapeutic exercise;Balance training;Neuromuscular re-education;Patient/family education;Manual techniques;DME Instruction;Passive range of motion;Dry needling;Taping;Vasopneumatic Device;Visual/perceptual remediation/compensation;Joint Manipulations    Consulted and Agree with Plan of Care  Patient       Patient will benefit from skilled therapeutic intervention in order to improve the following deficits and impairments:  Decreased range of motion, Impaired UE functional use, Increased muscle spasms, Decreased endurance, Decreased  activity tolerance, Pain, Impaired flexibility, Improper body mechanics, Hypomobility, Decreased mobility, Decreased strength  Visit Diagnosis: Acute pain of right shoulder  Stiffness of right  shoulder, not elsewhere classified  Muscle weakness (generalized)     Problem List Patient Active Problem List   Diagnosis Date Noted  . Family history of heart disease 03/06/2019  . Transaminitis 02/22/2019  . Inguinal hernia of left side without obstruction or gangrene 11/27/2018  . Biceps tendon tear 04/03/2018  . Neuroma of third interspace of left foot 02/02/2018  . Diverticulitis of colon 11/08/2016  . Adrenal incidentaloma ([place]) 11/08/2016  . Tarsal tunnel syndrome of left side 11/02/2015  . Erectile dysfunction 07/23/2015  . Plantar fasciitis of left foot 04/08/2015  . Back pain 04/08/2015  . Degenerative disc disease, cervical 04/08/2015  . Nonallopathic lesion of lumbosacral region 04/08/2015  . Nonallopathic lesion-rib cage 04/08/2015  . Nonallopathic lesion of cervical region 04/08/2015  . Tibialis posterior tendon tear, nontraumatic 02/18/2015  . Routine general medical examination at a health care facility 02/10/2012  . Adjustment reaction with anxiety and depression 11/11/2011  . Migraine headache 11/08/2010  . HYPERCHOLESTEROLEMIA 08/19/2009  . COLONIC POLYPS, ADENOMATOUS 08/12/2009    Lyndee Hensen, PT, DPT 2:05 PM  04/11/19    [facility], [place], [address] Phone: [phone]   Fax:  [phone]  Name: Harry Nash MRN: [id number] Date of Birth: [birth year]   PHYSICAL THERAPY DISCHARGE SUMMARY  Visits from Start of Care: 9  Plan: Patient agrees to discharge.  Patient goals were met. Patient is being discharged due to meeting the stated rehab goals.  ?????      Lyndee Hensen, PT, DPT 2:05 PM  04/11/19

## 2019-05-07 ENCOUNTER — Telehealth: Payer: Self-pay | Admitting: Family Medicine

## 2019-05-07 ENCOUNTER — Emergency Department (HOSPITAL_COMMUNITY): Payer: BLUE CROSS/BLUE SHIELD

## 2019-05-07 ENCOUNTER — Ambulatory Visit: Payer: BLUE CROSS/BLUE SHIELD | Admitting: Family Medicine

## 2019-05-07 ENCOUNTER — Other Ambulatory Visit: Payer: Self-pay

## 2019-05-07 ENCOUNTER — Observation Stay (HOSPITAL_COMMUNITY): Payer: BLUE CROSS/BLUE SHIELD

## 2019-05-07 ENCOUNTER — Observation Stay (HOSPITAL_COMMUNITY)
Admission: EM | Admit: 2019-05-07 | Discharge: 2019-05-08 | Disposition: A | Discharge status: Home / Self Care | Payer: BLUE CROSS/BLUE SHIELD | Attending: Family Medicine | Admitting: Family Medicine

## 2019-05-07 DIAGNOSIS — Z87891 Personal history of nicotine dependence: Secondary | ICD-10-CM | POA: Diagnosis not present

## 2019-05-07 DIAGNOSIS — G43909 Migraine, unspecified, not intractable, without status migrainosus: Secondary | ICD-10-CM | POA: Diagnosis not present

## 2019-05-07 DIAGNOSIS — E785 Hyperlipidemia, unspecified: Secondary | ICD-10-CM | POA: Insufficient documentation

## 2019-05-07 DIAGNOSIS — G459 Transient cerebral ischemic attack, unspecified: Secondary | ICD-10-CM | POA: Diagnosis not present

## 2019-05-07 DIAGNOSIS — N529 Male erectile dysfunction, unspecified: Secondary | ICD-10-CM | POA: Diagnosis not present

## 2019-05-07 DIAGNOSIS — I351 Nonrheumatic aortic (valve) insufficiency: Secondary | ICD-10-CM | POA: Diagnosis not present

## 2019-05-07 DIAGNOSIS — R531 Weakness: Secondary | ICD-10-CM | POA: Diagnosis not present

## 2019-05-07 DIAGNOSIS — E7800 Pure hypercholesterolemia, unspecified: Secondary | ICD-10-CM | POA: Diagnosis not present

## 2019-05-07 DIAGNOSIS — Z888 Allergy status to other drugs, medicaments and biological substances status: Secondary | ICD-10-CM | POA: Diagnosis not present

## 2019-05-07 DIAGNOSIS — I10 Essential (primary) hypertension: Secondary | ICD-10-CM | POA: Diagnosis not present

## 2019-05-07 DIAGNOSIS — I6523 Occlusion and stenosis of bilateral carotid arteries: Secondary | ICD-10-CM | POA: Diagnosis not present

## 2019-05-07 DIAGNOSIS — I672 Cerebral atherosclerosis: Secondary | ICD-10-CM | POA: Insufficient documentation

## 2019-05-07 DIAGNOSIS — Z7982 Long term (current) use of aspirin: Secondary | ICD-10-CM | POA: Diagnosis not present

## 2019-05-07 DIAGNOSIS — F419 Anxiety disorder, unspecified: Secondary | ICD-10-CM | POA: Insufficient documentation

## 2019-05-07 DIAGNOSIS — Z20822 Contact with and (suspected) exposure to covid-19: Secondary | ICD-10-CM | POA: Diagnosis not present

## 2019-05-07 DIAGNOSIS — Z79899 Other long term (current) drug therapy: Secondary | ICD-10-CM | POA: Diagnosis not present

## 2019-05-07 DIAGNOSIS — F329 Major depressive disorder, single episode, unspecified: Secondary | ICD-10-CM | POA: Diagnosis not present

## 2019-05-07 DIAGNOSIS — Z8249 Family history of ischemic heart disease and other diseases of the circulatory system: Secondary | ICD-10-CM | POA: Insufficient documentation

## 2019-05-07 DIAGNOSIS — R42 Dizziness and giddiness: Secondary | ICD-10-CM | POA: Diagnosis not present

## 2019-05-07 HISTORY — DX: Transient cerebral ischemic attack, unspecified: G45.9

## 2019-05-07 LAB — I-STAT CHEM 8, ED
BUN: 17 mg/dL (ref 8–23)
Calcium, Ion: 1.23 mmol/L (ref 1.15–1.40)
Chloride: 103 mmol/L (ref 98–111)
Creatinine, Ser: 0.9 mg/dL (ref 0.61–1.24)
Glucose, Bld: 104 mg/dL — ABNORMAL HIGH (ref 70–99)
HCT: 43 % (ref 39.0–52.0)
Hemoglobin: 14.6 g/dL (ref 13.0–17.0)
Potassium: 3.9 mmol/L (ref 3.5–5.1)
Sodium: 139 mmol/L (ref 135–145)
TCO2: 30 mmol/L (ref 22–32)

## 2019-05-07 LAB — DIFFERENTIAL
Abs Immature Granulocytes: 0.01 10*3/uL (ref 0.00–0.07)
Basophils Absolute: 0 10*3/uL (ref 0.0–0.1)
Basophils Relative: 0 %
Eosinophils Absolute: 0.2 10*3/uL (ref 0.0–0.5)
Eosinophils Relative: 2 %
Immature Granulocytes: 0 %
Lymphocytes Relative: 24 %
Lymphs Abs: 1.7 10*3/uL (ref 0.7–4.0)
Monocytes Absolute: 0.6 10*3/uL (ref 0.1–1.0)
Monocytes Relative: 8 %
Neutro Abs: 4.7 10*3/uL (ref 1.7–7.7)
Neutrophils Relative %: 66 %

## 2019-05-07 LAB — CBC
HCT: 44.9 % (ref 39.0–52.0)
Hemoglobin: 14.5 g/dL (ref 13.0–17.0)
MCH: 28.6 pg (ref 26.0–34.0)
MCHC: 32.3 g/dL (ref 30.0–36.0)
MCV: 88.6 fL (ref 80.0–100.0)
Platelets: 214 10*3/uL (ref 150–400)
RBC: 5.07 MIL/uL (ref 4.22–5.81)
RDW: 12.4 % (ref 11.5–15.5)
WBC: 7.2 10*3/uL (ref 4.0–10.5)
nRBC: 0 % (ref 0.0–0.2)

## 2019-05-07 LAB — COMPREHENSIVE METABOLIC PANEL
ALT: 29 U/L (ref 0–44)
AST: 27 U/L (ref 15–41)
Albumin: 3.9 g/dL (ref 3.5–5.0)
Alkaline Phosphatase: 55 U/L (ref 38–126)
Anion gap: 5 (ref 5–15)
BUN: 15 mg/dL (ref 8–23)
CO2: 26 mmol/L (ref 22–32)
Calcium: 8.9 mg/dL (ref 8.9–10.3)
Chloride: 106 mmol/L (ref 98–111)
Creatinine, Ser: 0.89 mg/dL (ref 0.61–1.24)
GFR calc Af Amer: >60 mL/min (ref >60)
GFR calc non Af Amer: >60 mL/min (ref >60)
Glucose, Bld: 108 mg/dL — ABNORMAL HIGH (ref 70–99)
Potassium: 3.9 mmol/L (ref 3.5–5.1)
Sodium: 137 mmol/L (ref 135–145)
Total Bilirubin: 1 mg/dL (ref 0.3–1.2)
Total Protein: 6.1 g/dL — ABNORMAL LOW (ref 6.5–8.1)

## 2019-05-07 LAB — SARS CORONAVIRUS 2 (TAT 6-24 HRS): SARS Coronavirus 2: NEGATIVE

## 2019-05-07 LAB — APTT: aPTT: 28 seconds (ref 24–36)

## 2019-05-07 LAB — PROTIME-INR
INR: 1.1 (ref 0.8–1.2)
Prothrombin Time: 14.4 seconds (ref 11.4–15.2)

## 2019-05-07 MED ORDER — ROSUVASTATIN CALCIUM 20 MG PO TABS: 20.0000 mg | ORAL_TABLET | Freq: Every day | ORAL | Status: DC

## 2019-05-07 MED ORDER — ACETAMINOPHEN 650 MG RE SUPP: 650.0000 mg | Freq: Four times a day (QID) | RECTAL | Status: DC | PRN

## 2019-05-07 MED ORDER — ASPIRIN EC 81 MG PO TBEC
81.0000 mg | DELAYED_RELEASE_TABLET | Freq: Every day | ORAL | Status: DC
  Administered 2019-05-08: 81 mg via ORAL
  Filled 2019-05-07: qty 1

## 2019-05-07 MED ORDER — CLOPIDOGREL BISULFATE 300 MG PO TABS
300.0000 mg | ORAL_TABLET | Freq: Every day | ORAL | Status: AC
  Administered 2019-05-07: 18:00:00 300 mg via ORAL
  Filled 2019-05-07: qty 1

## 2019-05-07 MED ORDER — ADULT MULTIVITAMIN W/MINERALS CH: 1.0000 | ORAL_TABLET | Freq: Every day | ORAL | Status: DC

## 2019-05-07 MED ORDER — ENOXAPARIN SODIUM 40 MG/0.4ML ~~LOC~~ SOLN: 40.0000 mg | SUBCUTANEOUS | Status: DC

## 2019-05-07 MED ORDER — ACETAMINOPHEN 325 MG PO TABS
650.0000 mg | ORAL_TABLET | Freq: Four times a day (QID) | ORAL | Status: DC | PRN
  Filled 2019-05-07: qty 2

## 2019-05-07 MED ORDER — SODIUM CHLORIDE 0.9% FLUSH
3.0000 mL | Freq: Once | INTRAVENOUS | Status: AC
Start: 2019-05-07 — End: 2019-05-07
  Administered 2019-05-07: 3 mL via INTRAVENOUS

## 2019-05-07 MED ORDER — ADULT MULTIVITAMIN W/MINERALS CH
1.0000 | ORAL_TABLET | Freq: Every day | ORAL | Status: DC
  Administered 2019-05-08: 1 via ORAL
  Filled 2019-05-07: qty 1

## 2019-05-07 MED ORDER — IOHEXOL 350 MG/ML SOLN
100.0000 mL | Freq: Once | INTRAVENOUS | Status: AC | PRN
  Administered 2019-05-07: 100 mL via INTRAVENOUS

## 2019-05-07 MED ORDER — CLOPIDOGREL BISULFATE 75 MG PO TABS
75.0000 mg | ORAL_TABLET | Freq: Every day | ORAL | Status: DC
  Administered 2019-05-08: 75 mg via ORAL
  Filled 2019-05-07: qty 1

## 2019-05-07 MED ORDER — ROSUVASTATIN CALCIUM 20 MG PO TABS: 40.0000 mg | ORAL_TABLET | Freq: Every day | ORAL | Status: DC

## 2019-05-07 NOTE — Consult Note (Signed)
Neurology Consultation  Reason for Consult: Possible stroke Referring Physician: Dr. Gilford Raid  CC: Transient dizziness along with left leg numbness  History is obtained from: Patient  HPI: Harry Nash is a 64 y.o. male with history of headache and depression.  Patient was brought to the emergency department for fear that he might be having a stroke or TIA.  CT head did show a possible dense MCA sign thus neurology was consulted.    Patient states that he was standing and talking to her son this morning when he suddenly noted that he felt off balance.  He stated that he felt dizzy and he was "walking like a drunk".  He felt as though he could not control his leg secondary to it being extremely numb.  He denies having any weakness in his leg and other neurological abnormalities.  He states that this lasted for approximately an hour and a half.  He continued to walk around hoping that the sensation would dissipate but it did not.  Patient was brought to the emergency department to be further evaluated for possible stroke or TIA.  Currently patient states that he feels back to baseline.  Patient states that he does take a aspirin 81 mg daily.  Along with Crestor daily and has not missed any doses.  Chart review-none  Work up that has been done: CT head to evaluate for mass, hemorrhage, stroke CTA head and neck to evaluate for possible large vessel occlusion   LKW: 11 AM on 05/07/2019 tpa given?: no, symptoms resolved Premorbid modified Rankin scale (mRS): 0 ABD C squared score 5 NIH stroke score 0  Past Medical History:  Diagnosis Date  . Depression   . Headache(784.0)   . Kidney stones     Family History  Problem Relation Age of Onset  . Heart disease Mother   . Heart disease Father   . Hypertension Father    Social History:   reports that he quit smoking about 16 years ago. He has never used smokeless tobacco. He reports current alcohol use of about 1.0 standard drinks of  alcohol per week. He reports that he does not use drugs.  Medications  Current Facility-Administered Medications:  .  [START ON 05/08/2019] aspirin EC tablet 81 mg, 81 mg, Oral, Daily, Marliss Coots, PA-C .  clopidogrel (PLAVIX) tablet 300 mg, 300 mg, Oral, Daily, Marliss Coots, PA-C .  [START ON 05/08/2019] clopidogrel (PLAVIX) tablet 75 mg, 75 mg, Oral, Daily, Marliss Coots, PA-C .  sodium chloride flush (NS) 0.9 % injection 3 mL, 3 mL, Intravenous, Once, Tegeler, Gwenyth Allegra, MD  Current Outpatient Medications:  .  aspirin 81 MG tablet, Take 1 tablet (81 mg total) by mouth daily., Disp: 30 tablet, Rfl:  .  LORazepam (ATIVAN) 0.5 MG tablet, TAKE ONE TABLET TWICE A DAY AS NEEDED FOR ANXIETy (Patient taking differently: Take 0.5 mg by mouth as needed for anxiety. TAKE ONE TABLET TWICE A DAY AS NEEDED FOR ANXIETy), Disp: 90 tablet, Rfl: 1 .  naproxen sodium (ANAPROX) 220 MG tablet, Take 220-440 mg by mouth 2 (two) times daily with a meal., Disp: , Rfl:  .  rizatriptan (MAXALT) 10 MG tablet, TAKE 1 TAB AT ONSET OF SYMPTOMS, MAY REPEAT IN 2 HOURS.DO NOT EXCEED 2 TABS/24 HRS., Disp: 9 tablet, Rfl: 3 .  rosuvastatin (CRESTOR) 10 MG tablet, Take 1 tablet (10 mg total) by mouth daily., Disp: 90 tablet, Rfl: 3 .  sildenafil (VIAGRA) 100 MG tablet, TAKE  1/2 TO 1 TABLET BY MOUTH DAILY AS NEEDED FOR ERECTILE DYSFUNCTION., Disp: 5 tablet, Rfl: 11  ROS:   General ROS: negative for - chills, fatigue, fever, night sweats, weight gain or weight loss Psychological ROS: negative for - behavioral disorder, hallucinations, memory difficulties, mood swings or suicidal ideation Ophthalmic ROS: negative for - blurry vision, double vision, eye pain or loss of vision ENT ROS: negative for - epistaxis, nasal discharge, oral lesions, sore throat, tinnitus or vertigo Allergy and Immunology ROS: negative for - hives or itchy/watery eyes Hematological and Lymphatic ROS: negative for - bleeding problems, bruising or  swollen lymph nodes Endocrine ROS: negative for - galactorrhea, hair pattern changes, polydipsia/polyuria or temperature intolerance Respiratory ROS: negative for - cough, hemoptysis, shortness of breath or wheezing Cardiovascular ROS: negative for - chest pain, dyspnea on exertion, edema or irregular heartbeat Gastrointestinal ROS: negative for - abdominal pain, diarrhea, hematemesis, nausea/vomiting or stool incontinence Genito-Urinary ROS: negative for - dysuria, hematuria, incontinence or urinary frequency/urgency Musculoskeletal ROS: negative for - joint swelling or muscular weakness Neurological ROS: as noted in HPI Dermatological ROS: negative for rash and skin lesion changes  Exam: Current vital signs: BP 131/88 (BP Location: Right Arm)   Pulse 67   Temp 98.1 F (36.7 C) (Oral)   Resp 17   Ht 5\' 11"  (1.803 m)   Wt 88 kg   SpO2 100%   BMI 27.06 kg/m  Vital signs in last 24 hours: Temp:  [98.1 F (36.7 C)] 98.1 F (36.7 C) (04/06 1430) Pulse Rate:  [67-73] 67 (04/06 1549) Resp:  [17-18] 17 (04/06 1549) BP: (131-139)/(86-88) 131/88 (04/06 1549) SpO2:  [98 %-100 %] 100 % (04/06 1549) Weight:  [88 kg] 88 kg (04/06 1440)   Constitutional: Appears well-developed and well-nourished.  Psych: Affect appropriate to situation Eyes: No scleral injection HENT: No OP obstrucion Head: Normocephalic.  Cardiovascular: Normal rate and regular rhythm.  Respiratory: Effort normal, non-labored breathing GI: Soft.  No distension. There is no tenderness.  Skin: WDI Neuro: Mental Status: Patient is awake, alert, oriented to person, place, month, year, and situation. Speech-intact naming, repeating, comprehension Patient is able to give a clear and coherent history. Cranial Nerves: II: Visual Fields are full.  III,IV, VI: EOMI without ptosis or diploplia. Pupils equal, round and reactive to light V: Facial sensation is symmetric to temperature VII: Facial movement is symmetric.   VIII: hearing is intact to voice X: Palat elevates symmetrically XI: Shoulder shrug is symmetric. XII: tongue is midline without atrophy or fasciculations.  Motor: Tone is normal. Bulk is normal. 5/5 strength was present in all four extremities.  Drift-none aterixis-none Sensory: Sensation is symmetric to light touch and temperature in the arms and legs. DSS intact Deep Tendon Reflexes: 2+ and symmetric in the biceps, I cannot elicit patellar or ankle reflexes Plantars: Toes are downgoing bilaterally.  Cerebellar: FNF and HKS are intact bilaterally  Labs I have reviewed labs in epic and the results pertinent to this consultation are:   CBC    Component Value Date/Time   WBC 7.2 05/07/2019 1451   RBC 5.07 05/07/2019 1451   HGB 14.6 05/07/2019 1508   HCT 43.0 05/07/2019 1508   PLT 214 05/07/2019 1451   MCV 88.6 05/07/2019 1451   MCH 28.6 05/07/2019 1451   MCHC 32.3 05/07/2019 1451   RDW 12.4 05/07/2019 1451   LYMPHSABS 1.7 05/07/2019 1451   MONOABS 0.6 05/07/2019 1451   EOSABS 0.2 05/07/2019 1451   BASOSABS 0.0 05/07/2019 1451  CMP     Component Value Date/Time   NA 139 05/07/2019 1508   NA 141 02/21/2019 1246   K 3.9 05/07/2019 1508   CL 103 05/07/2019 1508   CO2 26 05/07/2019 1451   GLUCOSE 104 (H) 05/07/2019 1508   BUN 17 05/07/2019 1508   BUN 14 02/21/2019 1246   CREATININE 0.90 05/07/2019 1508   CREATININE 0.93 07/23/2015 1037   CALCIUM 8.9 05/07/2019 1451   PROT 6.1 (L) 05/07/2019 1451   PROT 6.6 02/21/2019 1246   ALBUMIN 3.9 05/07/2019 1451   ALBUMIN 4.4 02/21/2019 1246   AST 27 05/07/2019 1451   ALT 29 05/07/2019 1451   ALKPHOS 55 05/07/2019 1451   BILITOT 1.0 05/07/2019 1451   BILITOT 0.5 02/21/2019 1246   GFRNONAA >60 05/07/2019 1451   GFRNONAA >89 07/23/2015 1037   GFRAA >60 05/07/2019 1451   GFRAA >89 07/23/2015 1037    Lipid Panel     Component Value Date/Time   CHOL 173 02/21/2019 1246   TRIG 65 02/21/2019 1246   HDL 57  02/21/2019 1246   CHOLHDL 3.0 02/21/2019 1246   CHOLHDL 3.4 07/23/2015 1037   VLDL 23 07/23/2015 1037   LDLCALC 103 (H) 02/21/2019 1246   LDLDIRECT 117 (H) 02/12/2010 2025     Imaging I have reviewed the images obtained:  CT-scan of the brain-hypodensity in the right MCA worrisome for the presence of thrombus.  Recommend CTA of head and neck.  CTA head neck-negative for large vessel occlusion.  No significant arterial stenosis in the head or neck.  Mild to moderate for age cervical carotid atherosclerosis.  Minimal intracranial atherosclerosis  MRI examination of the brain-pending  Etta Quill PA-C [facility] [phone]  M-F  (9:00 am- 5:00 PM)  05/07/2019, 4:50 PM     Assessment:  This is a 64 year old male with sudden onset of dizziness, gait and balance, left leg numbness which lasted for approximately an hour and a half and dissipated.  Currently back to baseline.  I believe he most likely did suffer from a TIA.   Impression: -Left leg numbness resolved -Dizziness and imbalance resolved -TIA  Recommend -Dr. Andria Frames has requested patient to be admitted by family practice -MRI of the brain without contrast -Transthoracic Echo -Will load with 300 mg Plavix with starting on 4/7 aspirin 81 mg plus Plavix 75 mg daily x 3 weeks  -Start or continue Atorvastatin 80 mg/other high intensity statin -BP goal: permissive HTN upto 220/120 mmHg -HBAIC and Lipid profile -Telemetry monitoring -Frequent neuro checks -NPO until passes stroke swallow screen -PT/OT # please page stroke NP  Or  PA  Or MD from 8am -4 pm  as this patient from this time will be  followed by the stroke.   You can look them up on [email or web address]  Password [email or web address]   NEUROHOSPITALIST ADDENDUM Performed a face to face diagnostic evaluation.   I have reviewed the contents of history and physical exam as documented by PA/ARNP/Resident and agree with above documentation.  I have discussed and  formulated the above plan as documented. Edits to the note have been made as needed.  Patient with no significant vascular risk factors-history of migraines presents to the emergency department sudden onset episode of dizziness/feeling of unsteadiness lasting for few minutes along with approximately 1 hour abnormal sensation and incoordination of his right leg.  Symptoms resolved by the time patient arrived.  Blood pressure was around [id number] systolic when checked by his wife. Denies any radiating pain from  his back to his leg.  Denies any other symptoms such as slurred speech, arm weakness or facial droop.  Patient's acute transient deficits are concerning for a transient ischemic attack, very atypical for a migraine or radiculopathy.  Given the absence of facial droop or arm weakness, stroke symptom does not fit the lacunar description-would worry about embolic phenomenon.  I certainly think that he needs a TTE with bubble study.  Will defer to stroke team to pursue TEE, loop monitor depending on the results of TTE.  CT angiogram does not show significant stenosis. Start patient on dual antiplatelet therapy for high risk TIA, mild ischemic stroke for 3 weeks and then transition back to aspirin.  Rest of stroke work-up pending  Stroke team to follow    Karena Addison Rivan Siordia MD [facility] RV:[id number]   If 7pm to 7am, please call on call as listed on AMION.

## 2019-05-07 NOTE — ED Notes (Signed)
Pt returned from CT at this time. Placed pt back on monitor, pt placed in position of comfort and advised of wait status.   

## 2019-05-07 NOTE — Telephone Encounter (Signed)
I am aware  

## 2019-05-07 NOTE — Telephone Encounter (Signed)
Spoke to wife. Asked if she told the desk that pt was sent there for a possible stroke. "Yes, we did, they would not let me stay with him." While we are talking she got a text saying pt was getting his head scanned. That made her feel better. Wife was worried he would not be looked at until all the other people were checked out. Wife wants to make sure that Dr. Andria Frames is aware that pt is at the hospital and is being looked at. Ottis Stain, CMA

## 2019-05-07 NOTE — Telephone Encounter (Signed)
Called patient.  Yes, go immediately to ER because of possibility of a stroke/TIA.  Patient understands.

## 2019-05-07 NOTE — ED Provider Notes (Signed)
[facility] EMERGENCY DEPARTMENT Provider Note   CSN: [id number] Arrival date & time: 05/07/19  1422     History Chief Complaint  Patient presents with  . Transient Ischemic Attack    Abdo Habeeb Gosselin is a 64 y.o. male.  HPI 64 year old male presents with transient left leg weakness and numbness and dizziness.  Started around 11 AM.  He was talking to his son when he all of a sudden noticed he was dizzy and felt like he was drunk.  No vision changes.  No headache associated with this.  His left leg felt like it was asleep and he was having a hard time walking.  Overall lasted about an hour.  Currently feels completely back to normal.  Never had any arm or facial symptoms.  No speech changes.  Has a history of hypercholesterolemia and is on atorvastatin.  Denies hypertension or other significant medical disease.   Past Medical History:  Diagnosis Date  . Depression   . Headache(784.0)   . Kidney stones     Patient Active Problem List   Diagnosis Date Noted  . Family history of heart disease 03/06/2019  . Transaminitis 02/22/2019  . Inguinal hernia of left side without obstruction or gangrene 11/27/2018  . Biceps tendon tear 04/03/2018  . Neuroma of third interspace of left foot 02/02/2018  . Diverticulitis of colon 11/08/2016  . Adrenal incidentaloma ([place]) 11/08/2016  . Tarsal tunnel syndrome of left side 11/02/2015  . Erectile dysfunction 07/23/2015  . Plantar fasciitis of left foot 04/08/2015  . Back pain 04/08/2015  . Degenerative disc disease, cervical 04/08/2015  . Nonallopathic lesion of lumbosacral region 04/08/2015  . Nonallopathic lesion-rib cage 04/08/2015  . Nonallopathic lesion of cervical region 04/08/2015  . Tibialis posterior tendon tear, nontraumatic 02/18/2015  . Routine general medical examination at a health care facility 02/10/2012  . Adjustment reaction with anxiety and depression 11/11/2011  . Migraine headache 11/08/2010  .  HYPERCHOLESTEROLEMIA 08/19/2009  . COLONIC POLYPS, ADENOMATOUS 08/12/2009    No past surgical history on file.     Family History  Problem Relation Age of Onset  . Heart disease Mother   . Heart disease Father   . Hypertension Father     Social History   Tobacco Use  . Smoking status: Former Smoker    Quit date: 02/01/2003    Years since quitting: 16.2  . Smokeless tobacco: Never Used  Substance Use Topics  . Alcohol use: Yes    Alcohol/week: 1.0 standard drinks    Types: 1 Cans of beer per week    Comment: moderate  . Drug use: No    Home Medications Prior to Admission medications   Medication Sig Start Date End Date Taking? Authorizing Provider  aspirin 81 MG tablet Take 1 tablet (81 mg total) by mouth daily. 07/24/15  Yes Hensel, Jamal Collin, MD  LORazepam (ATIVAN) 0.5 MG tablet TAKE ONE TABLET TWICE A DAY AS NEEDED FOR ANXIETy Patient taking differently: Take 0.5 mg by mouth as needed for anxiety. TAKE ONE TABLET TWICE A DAY AS NEEDED FOR ANXIETy 02/21/19   Hensel, Jamal Collin, MD  naproxen sodium (ANAPROX) 220 MG tablet Take 220-440 mg by mouth 2 (two) times daily with a meal.    [provider]  rizatriptan (MAXALT) 10 MG tablet TAKE 1 TAB AT ONSET OF SYMPTOMS, MAY REPEAT IN 2 HOURS.DO NOT EXCEED 2 TABS/24 HRS. 02/26/18   [name], MD  rosuvastatin (CRESTOR) 10 MG tablet Take  1 tablet (10 mg total) by mouth daily. 02/21/19   [name], MD  sildenafil (VIAGRA) 100 MG tablet TAKE 1/2 TO 1 TABLET BY MOUTH DAILY AS NEEDED FOR ERECTILE DYSFUNCTION. 05/14/18   [name], MD    Allergies    Gabapentin  Review of Systems   Review of Systems  Eyes: Negative for visual disturbance.  Neurological: Positive for dizziness, weakness and numbness. Negative for headaches.  All other systems reviewed and are negative.   Physical Exam Updated Vital Signs BP 131/88 (BP Location: Right Arm)   Pulse 67   Temp 98.1 F (36.7 C) (Oral)   Resp 17   Ht 5'  11" (1.803 m)   Wt 88 kg   SpO2 100%   BMI 27.06 kg/m   Physical Exam Vitals and nursing note reviewed.  Constitutional:      General: He is not in acute distress.    Appearance: He is well-developed. He is not ill-appearing or diaphoretic.  HENT:     Head: Normocephalic and atraumatic.     Right Ear: External ear normal.     Left Ear: External ear normal.     Nose: Nose normal.  Eyes:     General:        Right eye: No discharge.        Left eye: No discharge.     Extraocular Movements: Extraocular movements intact.     Pupils: Pupils are equal, round, and reactive to light.  Cardiovascular:     Rate and Rhythm: Normal rate and regular rhythm.     Heart sounds: Normal heart sounds.  Pulmonary:     Effort: Pulmonary effort is normal.     Breath sounds: Normal breath sounds.  Abdominal:     Palpations: Abdomen is soft.     Tenderness: There is no abdominal tenderness.  Musculoskeletal:     Cervical back: Neck supple.  Skin:    General: Skin is warm and dry.  Neurological:     Mental Status: He is alert.     Comments: CN 3-12 grossly intact. 5/5 strength in all 4 extremities. Grossly normal sensation. Normal finger to nose. Normal gait  Psychiatric:        Mood and Affect: Mood is not anxious.     ED Results / Procedures / Treatments   Labs (all labs ordered are listed, but only abnormal results are displayed) Labs Reviewed  COMPREHENSIVE METABOLIC PANEL - Abnormal; Notable for the following components:      Result Value   Glucose, Bld 108 (*)    Total Protein 6.1 (*)    All other components within normal limits  I-STAT CHEM 8, ED - Abnormal; Notable for the following components:   Glucose, Bld 104 (*)    All other components within normal limits  PROTIME-INR  APTT  CBC  DIFFERENTIAL  CBG MONITORING, ED    EKG EKG Interpretation  Date/Time:  Tuesday May 07 2019 14:41:57 EDT Ventricular Rate:  74 PR Interval:  140 QRS Duration: 84 QT  Interval:  384 QTC Calculation: 426 R Axis:   26 Text Interpretation: Normal sinus rhythm no acute ST/T changes no significant change since 2013 Confirmed by Sherwood Gambler [phone]) on 05/07/2019 3:58:47 PM   Radiology CT Angio Head W or Wo Contrast  Result Date: 05/07/2019 CLINICAL DATA:  64 year old male with sudden onset dizziness and left leg numbness at 1100 hours, now resolved. EXAM: CT ANGIOGRAPHY HEAD AND NECK TECHNIQUE: Multidetector CT imaging  of the head and neck was performed using the standard protocol during bolus administration of intravenous contrast. Multiplanar CT image reconstructions and MIPs were obtained to evaluate the vascular anatomy. Carotid stenosis measurements (when applicable) are obtained utilizing NASCET criteria, using the distal internal carotid diameter as the denominator. CONTRAST:  166mL OMNIPAQUE IOHEXOL 350 MG/ML SOLN COMPARISON:  Head CT without contrast earlier today. FINDINGS: CTA NECK Skeleton: Advanced cervical spine degeneration. Mild spondylolisthesis at C3-C4. Multilevel mild spinal stenosis suspected. No acute osseous abnormality identified. Upper chest: Negative. Other neck: Negative. Aortic arch: Mild Calcified aortic atherosclerosis. 3 vessel arch configuration. Right carotid system: Negative right CCA and right carotid bifurcation. Distal right ICA bulb calcified plaque without stenosis. Left carotid system: Minimal soft plaque in the anterior left CCA without stenosis. Mild soft and calcified plaque at the left ICA origin without stenosis. Vertebral arteries: Negative proximal right subclavian artery and right vertebral artery origin. Patent right vertebral artery to the skull base without plaque or stenosis. Mild soft plaque in the proximal left subclavian artery without stenosis. Normal left vertebral artery origin. The left vertebral is mildly non dominant and patent to the skull base without plaque or stenosis. CTA HEAD Posterior circulation: The left  vertebral artery is diminutive beyond the left PICA. Normal right PICA origin. No distal vertebral plaque or stenosis. Patent vertebrobasilar junction. Diminutive basilar artery is patent without stenosis. Fetal type bilateral PCA origins. Patent SCA origins. Bilateral PCA branches are within normal limits. Anterior circulation: Both ICA siphons are patent. Mild siphon plaque with no stenosis. Normal ophthalmic and posterior communicating artery origins. Patent carotid termini. Normal MCA and ACA origins. Tortuous ACA A1 segments. Normal anterior communicating artery. Bilateral ACA branches are within normal limits. Left MCA M1 segment and bifurcation are patent without stenosis. Left MCA branches are within normal limits. Right MCA M1 segment and trifurcation are patent without stenosis, with a dominant posterior right M2 branch. No right MCA branch occlusion or stenosis identified. Venous sinuses: Patent. Dominant right transverse and sigmoid sinuses. Anatomic variants: Fetal type bilateral PCA origins with mildly dominant right vertebral artery, right transverse and sigmoid sinuses. Review of the MIP images confirms the above findings IMPRESSION: 1. Negative for large vessel occlusion. No significant arterial stenosis in the head or neck. 2. Mild to moderate for age cervical carotid atherosclerosis. Minimal intracranial atherosclerosis. 3. Multilevel cervical spine degeneration and mild spinal stenosis. Electronically Signed   By: Genevie Ann M.D.   On: 05/07/2019 16:22   CT HEAD WO CONTRAST  Result Date: 05/07/2019 CLINICAL DATA:  Onset left leg weakness today. EXAM: CT HEAD WITHOUT CONTRAST TECHNIQUE: Contiguous axial images were obtained from the base of the skull through the vertex without intravenous contrast. COMPARISON:  None. FINDINGS: Brain: No evidence of acute infarction, hemorrhage, hydrocephalus, extra-axial collection or mass lesion/mass effect. Vascular: The right MCA appears hyper dense relative to  the left worrisome for the presence of thrombus. Skull: Intact.  No focal lesion. Sinuses/Orbits: Negative. Other: None. IMPRESSION: Hyperdense right MCA worrisome for the presence of thrombus. CTA is recommended for further evaluation. Critical Value/emergent results were called by telephone at the time of interpretation on 05/07/2019 at 3:29 pm to provider [facility] , who verbally acknowledged these results. Electronically Signed   By: Inge Rise M.D.   On: 05/07/2019 15:30   CT Angio Neck W and/or Wo Contrast  Result Date: 05/07/2019 CLINICAL DATA:  64 year old male with sudden onset dizziness and left leg numbness at 1100 hours, now resolved.  EXAM: CT ANGIOGRAPHY HEAD AND NECK TECHNIQUE: Multidetector CT imaging of the head and neck was performed using the standard protocol during bolus administration of intravenous contrast. Multiplanar CT image reconstructions and MIPs were obtained to evaluate the vascular anatomy. Carotid stenosis measurements (when applicable) are obtained utilizing NASCET criteria, using the distal internal carotid diameter as the denominator. CONTRAST:  139mL OMNIPAQUE IOHEXOL 350 MG/ML SOLN COMPARISON:  Head CT without contrast earlier today. FINDINGS: CTA NECK Skeleton: Advanced cervical spine degeneration. Mild spondylolisthesis at C3-C4. Multilevel mild spinal stenosis suspected. No acute osseous abnormality identified. Upper chest: Negative. Other neck: Negative. Aortic arch: Mild Calcified aortic atherosclerosis. 3 vessel arch configuration. Right carotid system: Negative right CCA and right carotid bifurcation. Distal right ICA bulb calcified plaque without stenosis. Left carotid system: Minimal soft plaque in the anterior left CCA without stenosis. Mild soft and calcified plaque at the left ICA origin without stenosis. Vertebral arteries: Negative proximal right subclavian artery and right vertebral artery origin. Patent right vertebral artery to the skull base  without plaque or stenosis. Mild soft plaque in the proximal left subclavian artery without stenosis. Normal left vertebral artery origin. The left vertebral is mildly non dominant and patent to the skull base without plaque or stenosis. CTA HEAD Posterior circulation: The left vertebral artery is diminutive beyond the left PICA. Normal right PICA origin. No distal vertebral plaque or stenosis. Patent vertebrobasilar junction. Diminutive basilar artery is patent without stenosis. Fetal type bilateral PCA origins. Patent SCA origins. Bilateral PCA branches are within normal limits. Anterior circulation: Both ICA siphons are patent. Mild siphon plaque with no stenosis. Normal ophthalmic and posterior communicating artery origins. Patent carotid termini. Normal MCA and ACA origins. Tortuous ACA A1 segments. Normal anterior communicating artery. Bilateral ACA branches are within normal limits. Left MCA M1 segment and bifurcation are patent without stenosis. Left MCA branches are within normal limits. Right MCA M1 segment and trifurcation are patent without stenosis, with a dominant posterior right M2 branch. No right MCA branch occlusion or stenosis identified. Venous sinuses: Patent. Dominant right transverse and sigmoid sinuses. Anatomic variants: Fetal type bilateral PCA origins with mildly dominant right vertebral artery, right transverse and sigmoid sinuses. Review of the MIP images confirms the above findings IMPRESSION: 1. Negative for large vessel occlusion. No significant arterial stenosis in the head or neck. 2. Mild to moderate for age cervical carotid atherosclerosis. Minimal intracranial atherosclerosis. 3. Multilevel cervical spine degeneration and mild spinal stenosis. Electronically Signed   By: Genevie Ann M.D.   On: 05/07/2019 16:22    Procedures Procedures (including critical care time)  Medications Ordered in ED Medications  sodium chloride flush (NS) 0.9 % injection 3 mL (has no administration  in time range)  iohexol (OMNIPAQUE) 350 MG/ML injection 100 mL (100 mLs Intravenous Contrast Given 05/07/19 1612)    ED Course  I have reviewed the triage vital signs and the nursing notes.  Pertinent labs & imaging results that were available during my care of the patient were reviewed by me and considered in my medical decision making (see chart for details).  Clinical Course as of May 06 1636  Tue May 07, 2019  1552 D/w Dr. Lorraine Lax. Agrees with CTA, no need for CT perfusion. See what CTA shows and likely admit   [SG]    Clinical Course User Index [SG] Sherwood Gambler, MD   MDM Rules/Calculators/A&P  Clinically patient seems to have had a TIA.  He is completely asymptomatic now.  CT angiography does not show obvious clot.  Neurology is currently consulting.  Plan for disposition per neurology recommendations.  Care to Dr. Gilford Raid. Final Clinical Impression(s) / ED Diagnoses Final diagnoses:  None    Rx / DC Orders ED Discharge Orders    None       Sherwood Gambler, MD 05/07/19 [phone]

## 2019-05-07 NOTE — ED Notes (Signed)
Pt ambulated to the room with a steady gait and no assistance.

## 2019-05-07 NOTE — ED Notes (Addendum)
Attempted to call report. Secretary said nurse will call back.

## 2019-05-07 NOTE — ED Notes (Signed)
Pt transported to CT via wheelchair at this time.   

## 2019-05-07 NOTE — ED Triage Notes (Signed)
Sent from PCP for possible TIA, LSW 1100am. Pt suddenly felt very dizzy, numbness of L leg lasting approx 1 hour. Symptoms completley resolved at this time.

## 2019-05-07 NOTE — H&P (Addendum)
[facility] Admission History and Physical Service Pager: [phone]  Patient name: Harry Nash Medical record number: [id number] Date of birth: February 08, 1955 Age: 64 y.o. Gender: male  Primary Care Provider: [name], MD Consultants: Neurology Code Status: Full Emergency Contact: Eda Paschal [phone]  Chief Complaint: Dizziness, left leg numbness  Assessment and Plan: Benford Poniatowski [facility] is a 64 y.o. male presenting with transient left leg numbness and gait instability and dizziness that started around 11 AM. PMH is significant for depression/anxiety, colonic polyps, degenerative disc disease, erectile dysfunction, hypercholesterolemia, migraine.  Transient Weakness/Numbness + Dizziness: Patient presents to the ED today after experiencing sudden onset numbness and gait instability of his left lower extremity and dizziness that started around 11 AM this morning.  He contacted his PCP by phone and was instructed to report to the ED immediately.  He had no slurred speech or facial droop.  Symptoms have since resolved and was able to ambulate without difficulty in the ED.  Neuro exam within normal limits.  No significant cardiovascular disease other than hypercholesterolemia currently on rosuvastatin and erectile dysfunction. He does have a 30 pack year smoking history.  CT head without contrast was notable for hyperdense right MCA worrisome for the presence of thrombus, however CTA was negative for large vessel occlusion or arterial stenosis in the head or neck.  He does have mild to moderate cervical carotid arthrosclerosis.  Labs obtained include CBC and CMP that were within normal limits. CBG of 104 on admission. EKG NSR.  Differential at this time is concerning for TIA, particularly given that symptoms have resolved.  Neurology consult in the ED and agreed with admission for stroke work-up. - Admit to FPTS, telemetry, attending Dr. McDiarmid -  Neurology consulted, appreciate recs - Continuous cardiac monitor, pulse ox  - Echocardiogram - MRI brain without contrast   - Follow-up Labs: A1C - Neuro exam q2 hours x 12 then q4hrs  - PT/OT consulted - SLP consulted - NPO until swallow study - Lovenox for VTE prophylaxis - Vitals per floor - I/O's - Tylenol PRN - increase home med to Rosuvastatin 40mg  QD  - Plavix 300mg  loading dose, then ASA 81mg  + Plavix 75mg  QD x 3 weeks - BP goal: permissive HTN up to 220/188mmHg  Hypercholesterolemia: Last lipid panel 02/21/19: Chol 173, HDL 57, Trig 65, LDL 103. Currently on Rosuavstatin 10mg  QD - increase to Rosuvastatin 40mg  QD  Migraine:  Chronic, stable. Home meds: Rizatriptan PRN. - consider adding if needed  Anxiety/Depression: Chronic, stable. Home meds: Ativan 0.5mg  BID PRN. - hold at this time, continue adding if needed  FEN/GI: NPO until passes swallow study Prophylaxis: Lovenox  Disposition: Telemetry, obs, pending work up  History of Present Illness:  Harry Nash is a 64 y.o. male presenting after experiencing transient dizziness and left leg numbness and unsteadiness that lasted ~1 hour. He notes that he felt the "whole leg fell asleep and had difficulty coordinating his movements". He notes this started late morning this morning. He notes he thought this was related to his hip but denies any history of hip issues. He reached out to his PCP this morning and was recommended he report to ED for further evaluation.   He denies ever having this before. Denies any infectious symptoms. Denies any cardiovascular disease. Compliant with medications. Notes family history of heart disease of siblings and parents. Notes his father had a heart attack at 21 yo. Brother had heart attack in his 33's. Denies  any family history of strokes.  Smoking history includes 1 PPD x  30 years, quit about 15 years ago. Alcohol: 1 beer per night.  Drugs: none   Review Of Systems: Per HPI with  the following additions:   Review of Systems  Constitutional: Negative for chills and fever.  HENT: Negative for congestion and sore throat.   Respiratory: Negative for cough, shortness of breath and wheezing.   Cardiovascular: Negative for chest pain.  Gastrointestinal: Negative for abdominal pain, constipation, diarrhea, nausea and vomiting.  Musculoskeletal: Negative for back pain and falls.  Neurological: Positive for dizziness, sensory change and focal weakness. Negative for speech change, seizures, loss of consciousness, weakness and headaches.    Patient Active Problem List   Diagnosis Date Noted  . Family history of heart disease 03/06/2019  . Transaminitis 02/22/2019  . Inguinal hernia of left side without obstruction or gangrene 11/27/2018  . Biceps tendon tear 04/03/2018  . Neuroma of third interspace of left foot 02/02/2018  . Diverticulitis of colon 11/08/2016  . Adrenal incidentaloma ([place]) 11/08/2016  . Tarsal tunnel syndrome of left side 11/02/2015  . Erectile dysfunction 07/23/2015  . Plantar fasciitis of left foot 04/08/2015  . Back pain 04/08/2015  . Degenerative disc disease, cervical 04/08/2015  . Nonallopathic lesion of lumbosacral region 04/08/2015  . Nonallopathic lesion-rib cage 04/08/2015  . Nonallopathic lesion of cervical region 04/08/2015  . Tibialis posterior tendon tear, nontraumatic 02/18/2015  . Routine general medical examination at a health care facility 02/10/2012  . Adjustment reaction with anxiety and depression 11/11/2011  . Migraine headache 11/08/2010  . HYPERCHOLESTEROLEMIA 08/19/2009  . COLONIC POLYPS, ADENOMATOUS 08/12/2009    Past Medical History: Past Medical History:  Diagnosis Date  . Depression   . Headache(784.0)   . Kidney stones     Past Surgical History: No past surgical history on file.  Social History: Social History   Tobacco Use  . Smoking status: Former Smoker    Quit date: 02/01/2003    Years since quitting:  16.2  . Smokeless tobacco: Never Used  Substance Use Topics  . Alcohol use: Yes    Alcohol/week: 1.0 standard drinks    Types: 1 Cans of beer per week    Comment: moderate  . Drug use: No   Additional social history: Smoking history includes 1 PPD x  30 years, quit about 15 years ago. Alcohol: 1 beer per night.  Drugs: none  Please also refer to relevant sections of EMR.  Family History: Family History  Problem Relation Age of Onset  . Heart disease Mother   . Heart disease Father   . Hypertension Father    Allergies and Medications: Allergies  Allergen Reactions  . Gabapentin Other (See Comments)    Clouded mentation   No current facility-administered medications on file prior to encounter.   Current Outpatient Medications on File Prior to Encounter  Medication Sig Dispense Refill  . aspirin 81 MG tablet Take 1 tablet (81 mg total) by mouth daily. 30 tablet   . LORazepam (ATIVAN) 0.5 MG tablet TAKE ONE TABLET TWICE A DAY AS NEEDED FOR ANXIETy (Patient taking differently: Take 0.5 mg by mouth as needed for anxiety. TAKE ONE TABLET TWICE A DAY AS NEEDED FOR ANXIETy) 90 tablet 1  . naproxen sodium (ANAPROX) 220 MG tablet Take 220-440 mg by mouth 2 (two) times daily with a meal.    . rizatriptan (MAXALT) 10 MG tablet TAKE 1 TAB AT ONSET OF SYMPTOMS, MAY REPEAT IN 2  HOURS.DO NOT EXCEED 2 TABS/24 HRS. 9 tablet 3  . rosuvastatin (CRESTOR) 10 MG tablet Take 1 tablet (10 mg total) by mouth daily. 90 tablet 3  . sildenafil (VIAGRA) 100 MG tablet TAKE 1/2 TO 1 TABLET BY MOUTH DAILY AS NEEDED FOR ERECTILE DYSFUNCTION. 5 tablet 11    Objective: BP 131/88 (BP Location: Right Arm)   Pulse 67   Temp 98.1 F (36.7 C) (Oral)   Resp 17   Ht 5\' 11"  (1.803 m)   Wt 88 kg   SpO2 100%   BMI 27.06 kg/m  Exam: ** Exam performed by Dr. Janus Molder and Dr. Tarry Kos ** General: Appears well. No acute distress. Appears stated age. Eyes: PERRL. No conjunctivae.  ENTM: Patent nares. No missing  teeth noted.  Neck: Normal range of motion. Tender with active movement. No visible masses. Cardiovascular: RRR, normal heart sounds, no murmur Respiratory: CTAB. Normal effort Gastrointestinal: Soft, non tender, no organomegaly, +BS MSK: 5/5 strength in U+L ext. Moves all ext. Spontaneously.  Derm: No rashes noted Neuro: PERRL. CN II-XII grossly intact. No focal deficit. Negative arm drop test.  Psych: Normal affect  Labs and Imaging: CBC BMET  Recent Labs  Lab 05/07/19 1451 05/07/19 1451 05/07/19 1508  WBC 7.2  --   --   HGB 14.5   < > 14.6  HCT 44.9   < > 43.0  PLT 214  --   --    < > = values in this interval not displayed.   Recent Labs  Lab 05/07/19 1451 05/07/19 1451 05/07/19 1508  NA 137   < > 139  K 3.9   < > 3.9  CL 106   < > 103  CO2 26  --   --   BUN 15   < > 17  CREATININE 0.89   < > 0.90  GLUCOSE 108*   < > 104*  CALCIUM 8.9  --   --    < > = values in this interval not displayed.     CT HEAD WITHOUT CONTRAST COMPARISON:  None. IMPRESSION: Hyperdense right MCA worrisome for the presence of thrombus. CTA is recommended for further evaluation.  CT ANGIOGRAPHY HEAD AND NECK COMPARISON:  Head CT without contrast earlier today. IMPRESSION: 1. Negative for large vessel occlusion. No significant arterial stenosis in the head or neck. 2. Mild to moderate for age cervical carotid atherosclerosis. Minimal intracranial atherosclerosis. 3. Multilevel cervical spine degeneration and mild spinal stenosis.  Gerlene Fee, DO 05/07/2019, 4:49 PM PGY-1, [place] [facility] pager: [phone], text pages welcome  Mitzi Davenport, DO 05/06/29, 4.49 PM PGY2, [place]

## 2019-05-07 NOTE — Telephone Encounter (Signed)
Pt. Wife called reporting pt. suddenly felt dizzy and numbness in left leg  (leg feels weird but strong ) requesting a call back at [phone]   Want to to know if doctor recommend he go to the ER.

## 2019-05-07 NOTE — ED Notes (Signed)
Pt transported to MRI via stretcher at this time.  °

## 2019-05-07 NOTE — Progress Notes (Deleted)
   I, Harry Nash, LAT, ATC, am serving as scribe for Dr. Lynne Leader.  Harry Nash is a 64 y.o. male who presents to [facility] today for L leg pain and numbness/tingling.  He was last seen by Dr. Tamala Julian on 11/27/18 for L lower quadrant and L hip pain and R shoulder pain.  Since his last visit, pt reports   Pertinent review of systems: ***  Relevant historical information: ***   Exam:  There were no vitals taken for this visit. General: Well Developed, well nourished, and in no acute distress.   MSK: ***    Lab and Radiology Results No results found for this or any previous visit (from the past 72 hour(s)). No results found.     Assessment and Plan: 64 y.o. male with ***   PDMP not reviewed this encounter. No orders of the defined types were placed in this encounter.  No orders of the defined types were placed in this encounter.    Discussed warning signs or symptoms. Please see discharge instructions. Patient expresses understanding.   ***

## 2019-05-07 NOTE — ED Provider Notes (Signed)
Pt signed out by Dr. Regenia Skeeter pending neurology recommendation.  Neurology recommends admission for TIA work up.  Pt is a Dr. Andria Frames pt, so the [facility] residents were called and will admit.   Isla Pence, MD 05/07/19 [phone]

## 2019-05-07 NOTE — Telephone Encounter (Signed)
Wife of patient called stating husband is at hospital and there are 69 people in the ED waiting room. Pls advise on what to do. Phone number [phone]

## 2019-05-08 ENCOUNTER — Encounter (HOSPITAL_COMMUNITY): Payer: Self-pay | Admitting: Family Medicine

## 2019-05-08 ENCOUNTER — Ambulatory Visit (HOSPITAL_COMMUNITY): Payer: BLUE CROSS/BLUE SHIELD | Attending: Neurology

## 2019-05-08 DIAGNOSIS — E785 Hyperlipidemia, unspecified: Secondary | ICD-10-CM | POA: Diagnosis not present

## 2019-05-08 DIAGNOSIS — G43909 Migraine, unspecified, not intractable, without status migrainosus: Secondary | ICD-10-CM | POA: Diagnosis not present

## 2019-05-08 DIAGNOSIS — Z20822 Contact with and (suspected) exposure to covid-19: Secondary | ICD-10-CM | POA: Diagnosis not present

## 2019-05-08 DIAGNOSIS — I639 Cerebral infarction, unspecified: Secondary | ICD-10-CM | POA: Insufficient documentation

## 2019-05-08 DIAGNOSIS — I351 Nonrheumatic aortic (valve) insufficiency: Secondary | ICD-10-CM | POA: Diagnosis not present

## 2019-05-08 DIAGNOSIS — Z8249 Family history of ischemic heart disease and other diseases of the circulatory system: Secondary | ICD-10-CM | POA: Diagnosis not present

## 2019-05-08 DIAGNOSIS — F329 Major depressive disorder, single episode, unspecified: Secondary | ICD-10-CM | POA: Diagnosis not present

## 2019-05-08 DIAGNOSIS — N529 Male erectile dysfunction, unspecified: Secondary | ICD-10-CM | POA: Diagnosis not present

## 2019-05-08 DIAGNOSIS — E7800 Pure hypercholesterolemia, unspecified: Secondary | ICD-10-CM

## 2019-05-08 DIAGNOSIS — G459 Transient cerebral ischemic attack, unspecified: Secondary | ICD-10-CM | POA: Diagnosis not present

## 2019-05-08 DIAGNOSIS — E663 Overweight: Secondary | ICD-10-CM | POA: Insufficient documentation

## 2019-05-08 DIAGNOSIS — Z79899 Other long term (current) drug therapy: Secondary | ICD-10-CM | POA: Diagnosis not present

## 2019-05-08 DIAGNOSIS — Z888 Allergy status to other drugs, medicaments and biological substances status: Secondary | ICD-10-CM | POA: Diagnosis not present

## 2019-05-08 DIAGNOSIS — I10 Essential (primary) hypertension: Secondary | ICD-10-CM | POA: Diagnosis not present

## 2019-05-08 DIAGNOSIS — Z87891 Personal history of nicotine dependence: Secondary | ICD-10-CM | POA: Diagnosis not present

## 2019-05-08 DIAGNOSIS — I672 Cerebral atherosclerosis: Secondary | ICD-10-CM | POA: Diagnosis not present

## 2019-05-08 DIAGNOSIS — F419 Anxiety disorder, unspecified: Secondary | ICD-10-CM | POA: Diagnosis not present

## 2019-05-08 DIAGNOSIS — Z7982 Long term (current) use of aspirin: Secondary | ICD-10-CM | POA: Diagnosis not present

## 2019-05-08 LAB — BASIC METABOLIC PANEL
Anion gap: 9 (ref 5–15)
BUN: 12 mg/dL (ref 8–23)
CO2: 25 mmol/L (ref 22–32)
Calcium: 9 mg/dL (ref 8.9–10.3)
Chloride: 107 mmol/L (ref 98–111)
Creatinine, Ser: 0.9 mg/dL (ref 0.61–1.24)
GFR calc Af Amer: >60 mL/min (ref >60)
GFR calc non Af Amer: >60 mL/min (ref >60)
Glucose, Bld: 94 mg/dL (ref 70–99)
Potassium: 4.1 mmol/L (ref 3.5–5.1)
Sodium: 141 mmol/L (ref 135–145)

## 2019-05-08 LAB — RAPID URINE DRUG SCREEN, HOSP PERFORMED
Amphetamines: NOT DETECTED
Barbiturates: NOT DETECTED
Benzodiazepines: NOT DETECTED
Cocaine: NOT DETECTED
Opiates: NOT DETECTED
Tetrahydrocannabinol: POSITIVE — AB

## 2019-05-08 LAB — CBC
HCT: 45.7 % (ref 39.0–52.0)
Hemoglobin: 15.1 g/dL (ref 13.0–17.0)
MCH: 29 pg (ref 26.0–34.0)
MCHC: 33 g/dL (ref 30.0–36.0)
MCV: 87.7 fL (ref 80.0–100.0)
Platelets: 215 10*3/uL (ref 150–400)
RBC: 5.21 MIL/uL (ref 4.22–5.81)
RDW: 12.3 % (ref 11.5–15.5)
WBC: 6 10*3/uL (ref 4.0–10.5)
nRBC: 0 % (ref 0.0–0.2)

## 2019-05-08 LAB — HIV ANTIBODY (ROUTINE TESTING W REFLEX): HIV Screen 4th Generation wRfx: NONREACTIVE

## 2019-05-08 MED ORDER — CLOPIDOGREL BISULFATE 75 MG PO TABS: 75.0000 mg | ORAL_TABLET | Freq: Every day | ORAL | 0 refills | Status: DC

## 2019-05-08 MED ORDER — ROSUVASTATIN CALCIUM 20 MG PO TABS: 20.0000 mg | ORAL_TABLET | Freq: Every day | ORAL | 0 refills | Status: DC

## 2019-05-08 NOTE — Progress Notes (Addendum)
STROKE TEAM PROGRESS NOTE   INTERVAL HISTORY His echo tech is at the bedside for TTE testing.  Pt lying in bed, AAOx3. Stated that he is at his baseline now. Yesterday, he was talking to his 64 year old son and had sudden onset lightheadedness, feeling of imbalance, left leg numbness and incoordination. After one hour, symptoms gradually went away. So far stroke work up neg.   Vitals:   05/07/19 2100 05/07/19 2300 05/08/19 0100 05/08/19 0500  BP: 137/88 120/86 119/69 114/66  Pulse: 72 73 60 62  Resp: 18 19 17 18   Temp: 97.9 F (36.6 C) 97.7 F (36.5 C) 98.6 F (37 C) 97.7 F (36.5 C)  TempSrc: Oral Oral Oral Oral  SpO2: 97% 95% 98% 98%  Weight:      Height:        CBC:  Recent Labs  Lab 05/07/19 1451 05/07/19 1451 05/07/19 1508 05/08/19 0517  WBC 7.2  --   --  6.0  NEUTROABS 4.7  --   --   --   HGB 14.5   < > 14.6 15.1  HCT 44.9   < > 43.0 45.7  MCV 88.6  --   --  87.7  PLT 214  --   --  215   < > = values in this interval not displayed.    Basic Metabolic Panel:  Recent Labs  Lab 05/07/19 1451 05/07/19 1451 05/07/19 1508 05/08/19 0517  NA 137   < > 139 141  K 3.9   < > 3.9 4.1  CL 106   < > 103 107  CO2 26  --   --  25  GLUCOSE 108*   < > 104* 94  BUN 15   < > 17 12  CREATININE 0.89   < > 0.90 0.90  CALCIUM 8.9  --   --  9.0   < > = values in this interval not displayed.   Lipid Panel:     Component Value Date/Time   CHOL 173 02/21/2019 1246   TRIG 65 02/21/2019 1246   HDL 57 02/21/2019 1246   CHOLHDL 3.0 02/21/2019 1246   CHOLHDL 3.4 07/23/2015 1037   VLDL 23 07/23/2015 1037   LDLCALC 103 (H) 02/21/2019 1246   HgbA1c: No results found for: HGBA1C Urine Drug Screen:     Component Value Date/Time   LABOPIA NONE DETECTED 05/07/2019 2330   COCAINSCRNUR NONE DETECTED 05/07/2019 2330   LABBENZ NONE DETECTED 05/07/2019 2330   AMPHETMU NONE DETECTED 05/07/2019 2330   THCU POSITIVE (A) 05/07/2019 2330   LABBARB NONE DETECTED 05/07/2019 2330     Alcohol Level     Component Value Date/Time   ETH <11 07/02/2012 1540    IMAGING past 24 hours CT Angio Head W or Wo Contrast  Result Date: 05/07/2019 CLINICAL DATA:  64 year old male with sudden onset dizziness and left leg numbness at 1100 hours, now resolved. EXAM: CT ANGIOGRAPHY HEAD AND NECK TECHNIQUE: Multidetector CT imaging of the head and neck was performed using the standard protocol during bolus administration of intravenous contrast. Multiplanar CT image reconstructions and MIPs were obtained to evaluate the vascular anatomy. Carotid stenosis measurements (when applicable) are obtained utilizing NASCET criteria, using the distal internal carotid diameter as the denominator. CONTRAST:  172mL OMNIPAQUE IOHEXOL 350 MG/ML SOLN COMPARISON:  Head CT without contrast earlier today. FINDINGS: CTA NECK Skeleton: Advanced cervical spine degeneration. Mild spondylolisthesis at C3-C4. Multilevel mild spinal stenosis suspected. No acute osseous abnormality identified. Upper  chest: Negative. Other neck: Negative. Aortic arch: Mild Calcified aortic atherosclerosis. 3 vessel arch configuration. Right carotid system: Negative right CCA and right carotid bifurcation. Distal right ICA bulb calcified plaque without stenosis. Left carotid system: Minimal soft plaque in the anterior left CCA without stenosis. Mild soft and calcified plaque at the left ICA origin without stenosis. Vertebral arteries: Negative proximal right subclavian artery and right vertebral artery origin. Patent right vertebral artery to the skull base without plaque or stenosis. Mild soft plaque in the proximal left subclavian artery without stenosis. Normal left vertebral artery origin. The left vertebral is mildly non dominant and patent to the skull base without plaque or stenosis. CTA HEAD Posterior circulation: The left vertebral artery is diminutive beyond the left PICA. Normal right PICA origin. No distal vertebral plaque or stenosis.  Patent vertebrobasilar junction. Diminutive basilar artery is patent without stenosis. Fetal type bilateral PCA origins. Patent SCA origins. Bilateral PCA branches are within normal limits. Anterior circulation: Both ICA siphons are patent. Mild siphon plaque with no stenosis. Normal ophthalmic and posterior communicating artery origins. Patent carotid termini. Normal MCA and ACA origins. Tortuous ACA A1 segments. Normal anterior communicating artery. Bilateral ACA branches are within normal limits. Left MCA M1 segment and bifurcation are patent without stenosis. Left MCA branches are within normal limits. Right MCA M1 segment and trifurcation are patent without stenosis, with a dominant posterior right M2 branch. No right MCA branch occlusion or stenosis identified. Venous sinuses: Patent. Dominant right transverse and sigmoid sinuses. Anatomic variants: Fetal type bilateral PCA origins with mildly dominant right vertebral artery, right transverse and sigmoid sinuses. Review of the MIP images confirms the above findings IMPRESSION: 1. Negative for large vessel occlusion. No significant arterial stenosis in the head or neck. 2. Mild to moderate for age cervical carotid atherosclerosis. Minimal intracranial atherosclerosis. 3. Multilevel cervical spine degeneration and mild spinal stenosis. Electronically Signed   By: Genevie Ann M.D.   On: 05/07/2019 16:22   CT HEAD WO CONTRAST  Result Date: 05/07/2019 CLINICAL DATA:  Onset left leg weakness today. EXAM: CT HEAD WITHOUT CONTRAST TECHNIQUE: Contiguous axial images were obtained from the base of the skull through the vertex without intravenous contrast. COMPARISON:  None. FINDINGS: Brain: No evidence of acute infarction, hemorrhage, hydrocephalus, extra-axial collection or mass lesion/mass effect. Vascular: The right MCA appears hyper dense relative to the left worrisome for the presence of thrombus. Skull: Intact.  No focal lesion. Sinuses/Orbits: Negative. Other:  None. IMPRESSION: Hyperdense right MCA worrisome for the presence of thrombus. CTA is recommended for further evaluation. Critical Value/emergent results were called by telephone at the time of interpretation on 05/07/2019 at 3:29 pm to provider [facility] , who verbally acknowledged these results. Electronically Signed   By: Inge Rise M.D.   On: 05/07/2019 15:30   CT Angio Neck W and/or Wo Contrast  Result Date: 05/07/2019 CLINICAL DATA:  64 year old male with sudden onset dizziness and left leg numbness at 1100 hours, now resolved. EXAM: CT ANGIOGRAPHY HEAD AND NECK TECHNIQUE: Multidetector CT imaging of the head and neck was performed using the standard protocol during bolus administration of intravenous contrast. Multiplanar CT image reconstructions and MIPs were obtained to evaluate the vascular anatomy. Carotid stenosis measurements (when applicable) are obtained utilizing NASCET criteria, using the distal internal carotid diameter as the denominator. CONTRAST:  182mL OMNIPAQUE IOHEXOL 350 MG/ML SOLN COMPARISON:  Head CT without contrast earlier today. FINDINGS: CTA NECK Skeleton: Advanced cervical spine degeneration. Mild spondylolisthesis at C3-C4. Multilevel  mild spinal stenosis suspected. No acute osseous abnormality identified. Upper chest: Negative. Other neck: Negative. Aortic arch: Mild Calcified aortic atherosclerosis. 3 vessel arch configuration. Right carotid system: Negative right CCA and right carotid bifurcation. Distal right ICA bulb calcified plaque without stenosis. Left carotid system: Minimal soft plaque in the anterior left CCA without stenosis. Mild soft and calcified plaque at the left ICA origin without stenosis. Vertebral arteries: Negative proximal right subclavian artery and right vertebral artery origin. Patent right vertebral artery to the skull base without plaque or stenosis. Mild soft plaque in the proximal left subclavian artery without stenosis. Normal left  vertebral artery origin. The left vertebral is mildly non dominant and patent to the skull base without plaque or stenosis. CTA HEAD Posterior circulation: The left vertebral artery is diminutive beyond the left PICA. Normal right PICA origin. No distal vertebral plaque or stenosis. Patent vertebrobasilar junction. Diminutive basilar artery is patent without stenosis. Fetal type bilateral PCA origins. Patent SCA origins. Bilateral PCA branches are within normal limits. Anterior circulation: Both ICA siphons are patent. Mild siphon plaque with no stenosis. Normal ophthalmic and posterior communicating artery origins. Patent carotid termini. Normal MCA and ACA origins. Tortuous ACA A1 segments. Normal anterior communicating artery. Bilateral ACA branches are within normal limits. Left MCA M1 segment and bifurcation are patent without stenosis. Left MCA branches are within normal limits. Right MCA M1 segment and trifurcation are patent without stenosis, with a dominant posterior right M2 branch. No right MCA branch occlusion or stenosis identified. Venous sinuses: Patent. Dominant right transverse and sigmoid sinuses. Anatomic variants: Fetal type bilateral PCA origins with mildly dominant right vertebral artery, right transverse and sigmoid sinuses. Review of the MIP images confirms the above findings IMPRESSION: 1. Negative for large vessel occlusion. No significant arterial stenosis in the head or neck. 2. Mild to moderate for age cervical carotid atherosclerosis. Minimal intracranial atherosclerosis. 3. Multilevel cervical spine degeneration and mild spinal stenosis. Electronically Signed   By: Genevie Ann M.D.   On: 05/07/2019 16:22   MR BRAIN WO CONTRAST  Result Date: 05/07/2019 CLINICAL DATA:  64 year old male with sudden onset dizziness and left leg numbness at 1100 hours today, now resolved. EXAM: MRI HEAD WITHOUT CONTRAST TECHNIQUE: Multiplanar, multiecho pulse sequences of the brain and surrounding structures  were obtained without intravenous contrast. COMPARISON:  Plain head CT, CTA head and neck earlier today. FINDINGS: Brain: No restricted diffusion to suggest acute infarction. No midline shift, mass effect, evidence of mass lesion, ventriculomegaly, extra-axial collection or acute intracranial hemorrhage. Cervicomedullary junction and pituitary are within normal limits. Pearline Cables and white matter signal is normal for age throughout the brain. No encephalomalacia or chronic cerebral blood products identified. Vascular: Major intracranial vascular flow voids are preserved. Skull and upper cervical spine: Negative visible cervical spine and bone marrow signal. Sinuses/Orbits: Negative orbits. Trace paranasal sinus mucosal thickening. Other: Trace fluid in the left mastoid air cells. Negative nasopharynx. Visible internal auditory structures appear normal. Stylomastoid foramina appear normal. Visible face and scalp soft tissues appear negative. IMPRESSION: Normal noncontrast MRI appearance of the brain. Electronically Signed   By: Genevie Ann M.D.   On: 05/07/2019 19:33    PHYSICAL EXAM  Temp:  [97.7 F (36.5 C)-98.6 F (37 C)] 98.1 F (36.7 C) (04/07 0900) Pulse Rate:  [60-73] 62 (04/07 0500) Resp:  [17-20] 20 (04/07 0900) BP: (114-149)/(66-97) 130/91 (04/07 0900) SpO2:  [93 %-100 %] 93 % (04/07 0900) Weight:  [88 kg] 88 kg (04/06 1440)  General -  Well nourished, well developed, in no apparent distress.  Ophthalmologic - fundi not visualized due to noncooperation.  Cardiovascular - Regular rhythm and rate.  Mental Status -  Level of arousal and orientation to time, place, and person were intact. Language including expression, naming, repetition, comprehension was assessed and found intact. Attention span and concentration were normal. Fund of Knowledge was assessed and was intact.  Cranial Nerves II - XII - II - Visual field intact OU. III, IV, VI - Extraocular movements intact. V - Facial sensation  intact bilaterally. VII - Facial movement intact bilaterally. VIII - Hearing & vestibular intact bilaterally. X - Palate elevates symmetrically. XI - Chin turning & shoulder shrug intact bilaterally. XII - Tongue protrusion intact.  Motor Strength - The patient's strength was normal in all extremities and pronator drift was absent.  Bulk was normal and fasciculations were absent.   Motor Tone - Muscle tone was assessed at the neck and appendages and was normal.  Reflexes - The patient's reflexes were symmetrical in all extremities and he had no pathological reflexes.  Sensory - Light touch, temperature/pinprick were assessed and were symmetrical.    Coordination - The patient had normal movements in the hands and feet with no ataxia or dysmetria.  Tremor was absent.  Gait and Station - deferred.   ASSESSMENT/PLAN Mr. Harry Nash is a 64 y.o. male with history of HA and depression presenting with transient lightheadedness, feeling of imbalance and left leg numbness incoordination. Resolved in one hour.   TIA vs. anxiety  CT head No acute abnormality    CTA head & neck unremarkable  MRI  Normal MRI  2D Echo pending  LDL 103  HgbA1c pending  lovenox for VTE prophylaxis  aspirin 81 mg daily prior to admission, now on aspirin 81 mg daily and clopidogrel 75 mg daily. Continue DAPT for 3 weeks and then plavix alone  Therapy recommendations:  none  Disposition:  home  Hyperlipidemia  Home meds:  crestor 10  LDL 103, goal < 70  Now on crestor 20  Continue statin at discharge  Other Stroke Risk Factors  Former Cigarette smoker - quit 16 years ago  Substance abuse - UDS:  THC POSITIVE. Patient advised to stop using due to stroke risk.  Other [facility] day # 0  OK to d/c home from neuro standpoint. Neurology will sign off. Please call with questions. Pt will follow up with stroke clinic NP at [facility] in about 4 weeks. Thanks for the  consult.  Rosalin Hawking, MD PhD Stroke Neurology 05/08/2019 10:44 AM    To contact Stroke Continuity provider, please refer to [email or web address] After hours, contact General Neurology

## 2019-05-08 NOTE — Discharge Summary (Signed)
[facility] Discharge Summary  Patient name: Harry Nash Medical record number: [id number] Date of birth: 29-Oct-1955 Age: 64 y.o. Gender: male Date of Admission: 05/07/2019  Date of Discharge: 05/08/2019 Admitting Physician: Blane Ohara McDiarmid, MD  Primary Care Provider: [name], MD Consultants: Neuro  Indication for Hospitalization: Concern for TIA  Discharge Diagnoses/Problem List:  TIA Numbness Weakness HLD Migraine Anxiety/Depression   Disposition: Home  Discharge Condition: Stable  Discharge Exam:   General: Appears well, no acute distress. Age appropriate. Cardiac: RRR, normal heart sounds, no murmurs Respiratory: CTAB, normal effort Extremities: No edema or cyanosis. Neuro: alert and oriented, no focal deficits  Brief Hospital Course:  Harry Nash is a 64 y.o. male presenting with transient left leg numbness and gait instability and dizziness .PMH is significant for depression/anxiety, colonic polyps, degenerative disc disease, erectile dysfunction, hypercholesterolemia, migraine. His hospital course is outlined below.    TIA Patient had experienced sudden onset of left lower extremity numbness, dizziness and gait instability. Additionally details in H&P. He exhibited no signs of slurred speech or facial drooping.  He remained hemodynamically stable.  EKG showed NSR without ST changes. CT head with worrisome for right MCA thrombus. MRI head was normal in appearance. CTA head/neck was negative for occlusions. Showed some multilevel cervical degeneration and mild stenosis.  Neurology was consulted and he was given a loading dose of Plavix 300 mg and started on DAPT with ASA 81 mg and Plavix 75 mg for three weeks.  He was started on high dose statin.  Last Lipid Panel from 01/21 showed LDL 103, while on low dose statin.  Permissive HTN was allowed for 24-48 hrs and he required no prn management.  ECHO with bubble study showed EF of  60-65% with normal wall motion.  Issues for Follow Up:  1. Needs to ensure continuation of DAPT for 3 weeks. 2. A1c pending at time of discharge. 3. UDS +THC, Neuro recommended cessation due to increased stroke risk  Significant Procedures: ECHO  Significant Labs and Imaging:  Recent Labs  Lab 05/07/19 1451 05/07/19 1508 05/08/19 0517  WBC 7.2  --  6.0  HGB 14.5 14.6 15.1  HCT 44.9 43.0 45.7  PLT 214  --  215   Recent Labs  Lab 05/07/19 1451 05/07/19 1451 05/07/19 1508 05/08/19 0517  NA 137  --  139 141  K 3.9   < > 3.9 4.1  CL 106  --  103 107  CO2 26  --   --  25  GLUCOSE 108*  --  104* 94  BUN 15  --  17 12  CREATININE 0.89  --  0.90 0.90  CALCIUM 8.9  --   --  9.0  ALKPHOS 55  --   --   --   AST 27  --   --   --   ALT 29  --   --   --   ALBUMIN 3.9  --   --   --    < > = values in this interval not displayed.   CT HEAD WITHOUT CONTRAST COMPARISON: None. IMPRESSION: Hyperdense right MCA worrisome for the presence of thrombus. CTA is recommended for further evaluation.  CT ANGIOGRAPHY HEAD AND NECK COMPARISON: Head CT without contrast earlier today. IMPRESSION: 1. Negative for large vessel occlusion. No significant arterial stenosis in the head or neck. 2. Mild to moderate for age cervical carotid atherosclerosis. Minimal intracranial atherosclerosis. 3. Multilevel cervical spine degeneration and  mild spinal stenosis  ECHO IMPRESSIONS  1. Left ventricular ejection fraction, by estimation, is 60 to 65%. The  left ventricle has normal function. The left ventricle has no regional  wall motion abnormalities. There is mild left ventricular hypertrophy.  Left ventricular diastolic parameters  are consistent with Grade I diastolic dysfunction (impaired relaxation).  2. Right ventricular systolic function is normal. The right ventricular  size is normal. There is normal pulmonary artery systolic pressure.  3. Bubble study negative for significant  atrial shunt, however, unable to  exclude small late intrahepatic/intrapulmonary shunt.  4. The mitral valve is grossly normal. Trivial mitral valve  regurgitation.  5. The aortic valve is tricuspid. Aortic valve regurgitation is trivial.  Mild aortic valve sclerosis is present, with no evidence of aortic valve  stenosis.  6. The inferior vena cava is normal in size with greater than 50%  respiratory variability, suggesting right atrial pressure of 3 mmHg.   Results/Tests Pending at Time of Discharge: A1c  Discharge Medications:  Allergies as of 05/08/2019      Reactions   Gabapentin Other (See Comments)   Clouded mentation      Medication List    TAKE these medications   aspirin 81 MG tablet Take 1 tablet (81 mg total) by mouth daily. Notes to patient: Tomorrow, 05/09/19.   clopidogrel 75 MG tablet Commonly known as: PLAVIX Take 1 tablet (75 mg total) by mouth daily. Start taking on: May 09, 2019 Notes to patient: Tomorrow, 05/09/19.   LORazepam 0.5 MG tablet Commonly known as: ATIVAN TAKE ONE TABLET TWICE A DAY AS NEEDED FOR ANXIETy What changed:   how much to take  how to take this  when to take this  reasons to take this   multivitamin with minerals Tabs tablet Take 1 tablet by mouth daily. Notes to patient: Tomorrow, 05/09/19.   rizatriptan 10 MG tablet Commonly known as: MAXALT TAKE 1 TAB AT ONSET OF SYMPTOMS, MAY REPEAT IN 2 HOURS.DO NOT EXCEED 2 TABS/24 HRS.   rosuvastatin 20 MG tablet Commonly known as: CRESTOR Take 1 tablet (20 mg total) by mouth daily at 6 PM. What changed:   medication strength  how much to take  when to take this Notes to patient: Tonight, 05/08/19.   sildenafil 100 MG tablet Commonly known as: VIAGRA TAKE 1/2 TO 1 TABLET BY MOUTH DAILY AS NEEDED FOR ERECTILE DYSFUNCTION. What changed: See the new instructions.       Discharge Instructions: Please refer to Patient Instructions section of EMR for full details.  Patient  was counseled important signs and symptoms that should prompt return to medical care, changes in medications, dietary instructions, activity restrictions, and follow up appointments.   Follow-Up Appointments: Follow-up Information    [facility]. Schedule an appointment as soon as possible for a visit in 4 week(s).   Specialty: Neurology Contact information: [address] [place] [phone]       [name], MD. Go on 05/13/2019.   Specialty: Family Medicine Why: at 9:50am for hospital follow up. Please arrive 15 minutes early to ensure you are able to be seen on time.  Contact information: [place] [address] [phone]          Gerlene Fee, DO 05/08/2019, 3:19 PM PGY-1, [place]

## 2019-05-08 NOTE — Progress Notes (Signed)
Nsg Discharge Note  Admit Date:  05/07/2019 Discharge date: 05/08/2019   [facility]man Jewels [place] to be D/C'd Home per MD order.  AVS completed.  Copy for chart, and copy for patient signed, and dated. Patient/caregiver able to verbalize understanding.  Discharge Medication: Allergies as of 05/08/2019      Reactions   Gabapentin Other (See Comments)   Clouded mentation      Medication List    TAKE these medications   aspirin 81 MG tablet Take 1 tablet (81 mg total) by mouth daily. Notes to patient: Tomorrow, 05/09/19.   clopidogrel 75 MG tablet Commonly known as: PLAVIX Take 1 tablet (75 mg total) by mouth daily. Start taking on: May 09, 2019 Notes to patient: Tomorrow, 05/09/19.   LORazepam 0.5 MG tablet Commonly known as: ATIVAN TAKE ONE TABLET TWICE A DAY AS NEEDED FOR ANXIETy What changed:   how much to take  how to take this  when to take this  reasons to take this   multivitamin with minerals Tabs tablet Take 1 tablet by mouth daily. Notes to patient: Tomorrow, 05/09/19.   rizatriptan 10 MG tablet Commonly known as: MAXALT TAKE 1 TAB AT ONSET OF SYMPTOMS, MAY REPEAT IN 2 HOURS.DO NOT EXCEED 2 TABS/24 HRS.   rosuvastatin 20 MG tablet Commonly known as: CRESTOR Take 1 tablet (20 mg total) by mouth daily at 6 PM. What changed:   medication strength  how much to take  when to take this Notes to patient: Tonight, 05/08/19.   sildenafil 100 MG tablet Commonly known as: VIAGRA TAKE 1/2 TO 1 TABLET BY MOUTH DAILY AS NEEDED FOR ERECTILE DYSFUNCTION. What changed: See the new instructions.       Discharge Assessment: Vitals:   05/08/19 0900 05/08/19 1237  BP: (!) 130/91 132/83  Pulse:  75  Resp: 20 18  Temp: 98.1 F (36.7 C) 98.3 F (36.8 C)  SpO2: 93% 97%   Skin clean, dry and intact without evidence of skin break down, no evidence of skin tears noted. IV catheter discontinued intact. Site without signs and symptoms of complications - no redness or edema noted  at insertion site, patient denies c/o pain - only slight tenderness at site.  Dressing with slight pressure applied.  D/c Instructions-Education: Discharge instructions given to patient/family with verbalized understanding. D/c education completed with patient/family including follow up instructions, medication list, d/c activities limitations if indicated, with other d/c instructions as indicated by MD - patient able to verbalize understanding, all questions fully answered. Patient instructed to return to ED, call 911, or call MD for any changes in condition.  Patient escorted via [place], and D/C home via private auto.  Joni Reining, RN 05/08/2019 3:53 PM

## 2019-05-08 NOTE — Evaluation (Signed)
Physical Therapy Evaluation Patient Details Name: Harry Nash MRN: [id number] DOB: 1955/08/28 Today's Date: 05/08/2019   History of Present Illness  64 y.o. male with history of HA and depression presenting with transient lightheadedness, feeling of imbalance and left leg numbness incoordination. MRI -.   Clinical Impression  Pt is likely at baseline functioning and should be safe and independent at home. There are no further acute PT needs.  Will sign off at this time.     Follow Up Recommendations No PT follow up    Equipment Recommendations  None recommended by PT    Recommendations for Other Services       Precautions / Restrictions Precautions Precautions: None      Mobility  Bed Mobility Overal bed mobility: Independent                Transfers Overall transfer level: Independent                  Ambulation/Gait Ambulation/Gait assistance: Independent Gait Distance (Feet): 400 Feet Assistive device: None Gait Pattern/deviations: WFL(Within Functional Limits)   Gait velocity interpretation: >4.37 ft/sec, indicative of normal walking speed General Gait Details: steady with age appropriate gait speed and ability to accept moderate challenge to balance without deviation.  Stairs Stairs: Yes Stairs assistance: Independent Stair Management: No rails;Alternating pattern;Forwards Number of Stairs: 9 General stair comments: safe without rail  Wheelchair Mobility    Modified Rankin (Stroke Patients Only) Modified Rankin (Stroke Patients Only) Pre-Morbid Rankin Score: No symptoms Modified Rankin: No symptoms     Balance Overall balance assessment: No apparent balance deficits (not formally assessed)                               Standardized Balance Assessment Standardized Balance Assessment : Dynamic Gait Index   Dynamic Gait Index Level Surface: Normal Change in Gait Speed: Normal Gait with Horizontal Head Turns:  Normal Gait with Vertical Head Turns: Normal Gait and Pivot Turn: Normal Step Over Obstacle: Normal Step Around Obstacles: Normal Steps: Normal Total Score: 24       Pertinent Vitals/Pain Pain Assessment: No/denies pain    Home Living Family/patient expects to be discharged to:: Private residence Living Arrangements: Spouse/significant other Available Help at Discharge: Family;Available PRN/intermittently Type of Home: House Home Access: Stairs to enter Entrance Stairs-Rails: Right Entrance Stairs-Number of Steps: 3-4 Home Layout: Two level;Bed/bath upstairs;1/2 bath on main level Home Equipment: None      Prior Function Level of Independence: Independent         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[employer]; 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[employer]   Dominant Hand: Right    Extremity/Trunk Assessment   Upper Extremity Assessment Upper Extremity Assessment: Overall WFL for tasks assessed    Lower Extremity Assessment Lower Extremity Assessment: Overall WFL for tasks assessed    Cervical / Trunk Assessment Cervical / Trunk Assessment: Normal  Communication   Communication: No difficulties  Cognition Arousal/Alertness: Awake/alert Behavior During Therapy: WFL for tasks assessed/performed Overall Cognitive Status: Within Functional Limits for tasks assessed                                        General Comments General comments (skin integrity, edema, etc.): educated on signs/symptoms of stroke and to become aware of his personal risk factors  and work to modify them.    Exercises     Assessment/Plan    PT Assessment Patent does not need any further PT services  PT Problem List         PT Treatment Interventions      PT Goals (Current goals can be found in the Care Plan section)  Acute Rehab PT Goals Patient Stated Goal: to go home PT Goal Formulation: All assessment and education complete, DC therapy    Frequency      Barriers to discharge        Co-evaluation               AM-PAC PT "6 Clicks" Mobility  Outcome Measure Help needed turning from your back to your side while in a flat bed without using bedrails?: None Help needed moving from lying on your back to sitting on the side of a flat bed without using bedrails?: None Help needed moving to and from a bed to a chair (including a wheelchair)?: None Help needed standing up from a chair using your arms (e.g., wheelchair or bedside chair)?: None Help needed to walk in hospital room?: None Help needed climbing 3-5 steps with a railing? : None 6 Click Score: 24    End of Session   Activity Tolerance: Patient tolerated treatment well Patient left: in bed;with call bell/phone within reach Nurse Communication: Mobility status PT Visit Diagnosis: Unsteadiness on feet (R26.81)    Time: [id number] PT Time Calculation (min) (ACUTE ONLY): 10 min   Charges:   PT Evaluation $PT Eval Low Complexity: 1 Low          05/08/2019  Ginger Carne., PT Acute Rehabilitation Services [phone]  (pager) [phone]  (office)  Harry Nash 05/08/2019, 5:13 PM

## 2019-05-08 NOTE — Plan of Care (Signed)
Patient teaching back BEFAST symptoms to report, also verbalizes understanding for follow up care.

## 2019-05-08 NOTE — Discharge Instructions (Signed)
You were hospitalized at [facility] due to concerns for a stroke. We suspect you may have had a TIA (which is a small stroke that resolves on its own). Your work up in the hospital did not find any reversable reasons to explain your stroke.   Going forward, it will be important for you to focus on life style modifications such as avoiding excessive alcohol, smoking, good blood pressure control, and taking your medications as prescribed. Please continue your baby aspirin daily. We have started you on a medicine called Plavix.You will take this daily for 3 weeks. We have also increased your cholesterol medicine.   Please be sure to follow up with your PCP on 4/12 at 9:50am for a hospital follow up.  Neurology would also like you to follow up with them in 4 weeks for continued monitoring. You should hear from their office to get this scheduled, if you do not please call their office.   Please seek medical care if you have a recurrence of any weakness, numbness, facial droop, vision changes, difficulty with speaking or understanding speech. Take care and thank you for allowing [place] to take care of you.

## 2019-05-08 NOTE — Hospital Course (Addendum)
Harry Nash is a 63 y.o. male presenting with transient left leg numbness and gait instability and dizziness .PMH is significant for depression/anxiety, colonic polyps, degenerative disc disease, erectile dysfunction, hypercholesterolemia, migraine. His hospital course is outlined below.    TIA Patient had experienced sudden onset of left lower extremity numbness, dizziness and gait instability. Additionally details in H&P. He exhibited no signs of slurred speech or facial drooping.  He remained hemodynamically stable.  EKG showed NSR without ST changes. CT head with worrisome for right MCA thrombus. MRI head was normal in appearance. CTA head/neck was negative for occlusions. Showed some multilevel cervical degeneration and mild stenosis.  Neurology was consulted and he was given a loading dose of Plavix 300 mg and started on DAPT with ASA 81 mg and Plavix 75 mg for three weeks.  He was started on high dose statin.  Last Lipid Panel from 01/21 showed LDL 103, while on low dose statin.  Permissive HTN was allowed for 24-48 hrs and he required no prn management.  ECHO with bubble study showed EF of 60-65% with normal wall motion.

## 2019-05-08 NOTE — Social Work (Signed)
CSW met with pt bedside. CSW introduced self to pt and explained role. CSW completed sbirt and pt scored a 3 on the scale. Pt stated that he has 1 beer every couple of days with his dinner. Pt stated that he smokes marijuana occasionally. Pt stated that he does not have a an issue with his alcohol use or marijuana use.  CSW offered resources and pt declined.  Emeterio Reeve, Latanya Presser, [place] Licensed Clinical Social Worker [phone]

## 2019-05-08 NOTE — Progress Notes (Signed)
Occupational Therapy Evaluation Patient Details Name: Harry Nash MRN: [id number] DOB: 1955-06-07 Today's Date: 05/08/2019    History of Present Illness 64 y.o. male with history of HA and depression presenting with transient lightheadedness, feeling of imbalance and left leg numbness incoordination. MRI -.    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 Pt appears back to baseline regarding ADL and mobility. Reviewed warning signs/symptoms of CVA using BeFAST.  OT signing off.     Follow Up Recommendations  No OT follow up    Equipment Recommendations  None recommended by OT    Recommendations for Other Services       Precautions / Restrictions Precautions Precautions: None      Mobility Bed Mobility Overal bed mobility: Independent                Transfers Overall transfer level: Independent                    Balance Overall balance assessment: No apparent balance deficits (not formally assessed)                                         ADL either performed or assessed with clinical judgement   ADL Overall ADL's : Independent;At baseline                                             Vision Baseline Vision/History: Wears glasses Wears Glasses: At all times Patient Visual Report: No change from baseline Vision Assessment?: No apparent visual deficits     Perception     Praxis Praxis Praxis tested?: Within functional limits    Pertinent Vitals/Pain Pain Assessment: No/denies pain     Hand Dominance Right   Extremity/Trunk Assessment Upper Extremity Assessment Upper Extremity Assessment: Overall WFL for tasks assessed   Lower Extremity Assessment Lower Extremity Assessment: Defer to PT evaluation   Cervical / Trunk Assessment Cervical / Trunk Assessment: Normal   Communication  Communication Communication: No difficulties   Cognition Arousal/Alertness: Awake/alert Behavior During Therapy: WFL for tasks assessed/performed Overall Cognitive Status: Within Functional Limits for tasks assessed                                     General Comments  Educated on warning signs/symptoms of CVA using BeFAST    Exercises     Shoulder Instructions      Home Living Family/patient expects to be discharged to:: Private residence Living Arrangements: Spouse/significant other Available Help at Discharge: Family;Available PRN/intermittently Type of Home: House Home Access: Stairs to enter [employer] of Steps: 3-4 Entrance Stairs-Rails: Right Home Layout: Two level;Bed/bath upstairs;1/2 bath on main level Alternate Level Stairs-Number of Steps: flight   Bathroom Shower/Tub: Occupational psychologist: Standard Bathroom Accessibility: Yes How Accessible: Accessible via walker Home Equipment: None          Prior Functioning/Environment Level of Independence: Independent        Comments: 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[employer]; 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[employer]        OT Problem List:  OT Treatment/Interventions:      OT Goals(Current goals can be found in the care plan section) Acute Rehab OT Goals Patient Stated Goal: to go home OT Goal Formulation: All assessment and education complete, DC therapy  OT Frequency:     Barriers to D/C:            Co-evaluation              AM-PAC OT "6 Clicks" Daily Activity     Outcome Measure Help from another person eating meals?: None Help from another person taking care of personal grooming?: None Help from another person toileting, which includes using toliet, bedpan, or urinal?: None Help from another person bathing (including washing, rinsing, drying)?: None Help from another person to put on and taking off regular upper body clothing?: None Help from another person to put  on and taking off regular lower body clothing?: None 6 Click Score: 24   End of Session Nurse Communication: Mobility status  Activity Tolerance: Patient tolerated treatment well Patient left: in bed;with call bell/phone within reach  OT Visit Diagnosis: Unsteadiness on feet (R26.81)                Time: [id number] OT Time Calculation (min): 15 min Charges:  OT General Charges $OT Visit: 1 Visit OT Evaluation $OT Eval Low Complexity: [place], OT/L   Acute OT Clinical Specialist Acute Rehabilitation Services Pager [phone] Office [phone]   [facility] 05/08/2019, 3:27 PM

## 2019-05-08 NOTE — Plan of Care (Signed)
  Problem: Education: Goal: Knowledge of disease or condition will improve Outcome: Progressing  Patient able to verbalize genetic predispositions that contribute to stroke, risk factors such as coagulopathies, and strategies such as reporting abnormal neurological symptoms early.

## 2019-05-08 NOTE — Progress Notes (Signed)
Echocardiogram 2D Echocardiogram has been performed.  Oneal Deputy Che Below 05/08/2019, 11:02 AM

## 2019-05-09 LAB — HEMOGLOBIN A1C
Hgb A1c MFr Bld: 5.3 % (ref 4.8–5.6)
Mean Plasma Glucose: 105 mg/dL

## 2019-05-13 ENCOUNTER — Encounter: Payer: Self-pay | Admitting: Family Medicine

## 2019-05-13 ENCOUNTER — Other Ambulatory Visit: Payer: Self-pay

## 2019-05-13 ENCOUNTER — Ambulatory Visit (INDEPENDENT_AMBULATORY_CARE_PROVIDER_SITE_OTHER): Payer: BLUE CROSS/BLUE SHIELD | Admitting: Family Medicine

## 2019-05-13 DIAGNOSIS — Z8249 Family history of ischemic heart disease and other diseases of the circulatory system: Secondary | ICD-10-CM

## 2019-05-13 DIAGNOSIS — G459 Transient cerebral ischemic attack, unspecified: Secondary | ICD-10-CM | POA: Diagnosis not present

## 2019-05-13 DIAGNOSIS — M4802 Spinal stenosis, cervical region: Secondary | ICD-10-CM | POA: Diagnosis not present

## 2019-05-13 DIAGNOSIS — E7800 Pure hypercholesterolemia, unspecified: Secondary | ICD-10-CM | POA: Diagnosis not present

## 2019-05-13 NOTE — Assessment & Plan Note (Addendum)
Continue dual antiplatelet therapy and high intensity statin.  After patient left and while I was completing note, found neuro note in hospital that post DAPT he should be continued on plavix.  Patient was notified via phone.

## 2019-05-13 NOTE — Patient Instructions (Addendum)
I will look to the recommendations of the neurologist as to whether you drop the plavix or aspirin.  You should be on both through 05/29/19.  Make sure the neurologist knows you were and are taking 81 mg aspirin.  They sometimes like the higher 325 mg dose.  You are now considered secondary prevention - so we have stricter recommendations on cholesterol.  We will want your bad cholesterol (LDL) less than 70 when we remeasure in 90.  You will be due for that blood test in early July.  The order is in.  Just call for a lab appointment. You have mild to moderate cerebrovascular arteriosclerosis.  Even if this was not a TIA, we would still treat you as secondary prevention. Let me know if you have irregular or racing heart rate (palpitations).  The reason is that a common heart rhythm problem, a fib, can occur intermittently and is a cause of strokes and TIAs.  We would use different anticoagulant.  So, if you experience palpitations, we will do more testing to look for the episodic a fib (atrial fibrillation.)     Neck arthritis.  Not much you can do about the neck pain.  If you start developing arm pain, let me know.  That is called radiculopathy - or a pinched nerve coming out of your neck.  We can do things about that.  No rush.  Not an emergency.  You had mild spinal stenosis - or narrowing of the spinal canal.  If you ever develop bowel, bladder or leg problems, come in immediately.  Pressure on the spinal cord is a surgical emergency.  By your x rays, you are a long way from having that severe of narrowing.

## 2019-05-13 NOTE — Assessment & Plan Note (Signed)
On crestor,  Will need LDL recheck in 3 months.

## 2019-05-13 NOTE — Assessment & Plan Note (Signed)
Mild spinal stenosis seen as incidental finding with TIA workup.  He has no radiculopathy symptoms and no leg, bowel or bladder symptoms.  Educated about what things to look for in the long run - see AVS.

## 2019-05-13 NOTE — Progress Notes (Signed)
    SUBJECTIVE:   CHIEF COMPLAINT / HPI:   Hospital FU, presumed TIA. Patient is fully back to normal.  MRI was normal, no permanent injury.  Story is good for TIA.  And he had cerebrovascular disease documented on his CTA.  Issues: 1. No need for rehab.  He is asymptomatic. 2. He was on ASA 81 mg.  He will be on Dual antiplatelet therapy for 3 weeks.  Unclear to me whether he goes back on ASA alone (dose?) or remains on plavix as an ASA failure at the end of three weeks.  He is uncertain about when he is scheduled for neuro follow up. 3. Cholesterol.  Now secondary prevention.  Goal LDL is less than 70.  On rosuvastatin.  Will need repeat LDL in 3 months. 4. He has no history of palpitations.  He has not been referred for event monitoring for occult a fib.  Also had questions of neck arthritis seen on CTA of neck.  Denies arm pain.  Also denies leg, bowel or bladder symptoms.   OBJECTIVE:   BP 112/72   Pulse 79   Ht 5\' 11"  (1.803 m)   Wt 192 lb (87.1 kg)   SpO2 97%   BMI 26.78 kg/m   Cardiac RRR without m or g Neuro exam is normal  ASSESSMENT/PLAN:   HYPERCHOLESTEROLEMIA On crestor,  Will need LDL recheck in 3 months.  Transient ischemic attack (TIA) Continue dual antiplatelet therapy and high intensity statin.  After patient left and while I was completing note, found neuro note in hospital that post DAPT he should be continued on plavix.  Patient was notified via phone.  Cervical spinal stenosis Mild spinal stenosis seen as incidental finding with TIA workup.  He has no radiculopathy symptoms and no leg, bowel or bladder symptoms.  Educated about what things to look for in the long run - see AVS.     [name], [place]

## 2019-05-15 ENCOUNTER — Encounter: Payer: Self-pay | Admitting: Family Medicine

## 2019-06-03 ENCOUNTER — Encounter: Payer: Self-pay | Admitting: Adult Health

## 2019-06-03 ENCOUNTER — Other Ambulatory Visit: Payer: Self-pay | Admitting: Family Medicine

## 2019-06-04 ENCOUNTER — Other Ambulatory Visit: Payer: Self-pay | Admitting: Family Medicine

## 2019-06-04 ENCOUNTER — Encounter: Payer: Self-pay | Admitting: Family Medicine

## 2019-06-04 DIAGNOSIS — G459 Transient cerebral ischemic attack, unspecified: Secondary | ICD-10-CM

## 2019-06-11 ENCOUNTER — Inpatient Hospital Stay: Payer: BLUE CROSS/BLUE SHIELD | Admitting: Adult Health

## 2019-07-23 ENCOUNTER — Encounter: Payer: Self-pay | Admitting: Family Medicine

## 2019-08-22 ENCOUNTER — Encounter: Payer: Self-pay | Admitting: Family Medicine

## 2019-08-28 ENCOUNTER — Ambulatory Visit: Payer: Self-pay

## 2019-08-28 ENCOUNTER — Other Ambulatory Visit: Payer: Self-pay

## 2019-08-28 ENCOUNTER — Ambulatory Visit: Payer: BLUE CROSS/BLUE SHIELD | Admitting: Family Medicine

## 2019-08-28 VITALS — BP 138/86 | HR 81 | Ht 71.0 in | Wt 186.0 lb

## 2019-08-28 DIAGNOSIS — M75111 Incomplete rotator cuff tear or rupture of right shoulder, not specified as traumatic: Secondary | ICD-10-CM

## 2019-08-28 DIAGNOSIS — G8929 Other chronic pain: Secondary | ICD-10-CM | POA: Diagnosis not present

## 2019-08-28 DIAGNOSIS — M25511 Pain in right shoulder: Secondary | ICD-10-CM

## 2019-08-28 DIAGNOSIS — M4802 Spinal stenosis, cervical region: Secondary | ICD-10-CM

## 2019-08-28 DIAGNOSIS — M5030 Other cervical disc degeneration, unspecified cervical region: Secondary | ICD-10-CM | POA: Diagnosis not present

## 2019-08-28 MED ORDER — GABAPENTIN 100 MG PO CAPS: 200.0000 mg | ORAL_CAPSULE | Freq: Every day | ORAL | 0 refills | Status: DC

## 2019-08-28 NOTE — Patient Instructions (Signed)
PT [place] Gabapentin 200 mg Keep hands in peripheral vision See me in 6 weeks

## 2019-08-28 NOTE — Progress Notes (Signed)
[place] Phone: [phone] Subjective:   Harry Nash, am serving as a scribe for [name]. This visit occurred during the SARS-CoV-2 public health emergency.  Safety protocols were in place, including screening questions prior to the visit, additional usage of staff PPE, and extensive cleaning of exam room while observing appropriate contact time as indicated for disinfecting solutions.   I'm seeing this patient by the request  of:  [name], MD  CC:   [id number]   11/27/2018 MRI present patient does have a rotator cuff tear and is failed all conservative therapy at this time.  I would like patient to follow-up with surgeon.  Patient will be referred for orthopedic surgery to discuss surgical intervention.  I believe the patient does have a hernia, patient seems to be stable.  Has pain medications, discussed keeping stool soft at the moment, referred to general surgery to further evaluation and potential treatment.  Update 08/28/2019 Harry Nash is a 64 y.o. male coming in with complaint of neck pain. Patient states pain is left sided cervical spine that is sharp. Feels his movement is restricted and notices clicking and popping. Had right shoulder surgery in November and wore sling for 3 weeks.   Feels like sight of right shoulder surgery is irritated. Is worried about tendon tear in the area of repair.   Patient was seen in the emergency room on April 6 for what appeared to be more of a TIA.  Recent imaging includes an MRI of the brain done on May 07, 2019.  This was independently visualized by me showing Nash significant abnormality, CT scan of the neck with and without contrast showed that the blood supply was normal but it does have a little degenerative changes of the spine with mild spinal stenosis diffusely.  Past Medical History:  Diagnosis Date  . Depression   .  Headache(784.0)   . Kidney stones   . TIA (transient ischemic attack)    Past Surgical History:  Procedure Laterality Date  . HERNIA REPAIR    . ROTATOR CUFF REPAIR     Social History   Socioeconomic History  . Marital status: Married    Spouse name: Not on file  . Number of children: 3  . Years of education: Not on file  . Highest education level: Not on file  Occupational History  . Not on file  Tobacco Use  . Smoking status: Former Smoker    Quit date: 02/01/2003    Years since quitting: 16.5  . Smokeless tobacco: Never Used  Vaping Use  . Vaping Use: Never used  Substance and Sexual Activity  . Alcohol use: Yes    Alcohol/week: 1.0 standard drink    Types: 1 Cans of beer per week    Comment: moderate  . Drug use: Nash  . Sexual activity: Yes  Other Topics Concern  . Not on file  Social History Narrative  . Not on file   Social Determinants of Health   Financial Resource Strain:   . Difficulty of Paying Living Expenses:   Food Insecurity:   . Worried About Charity fundraiser in the Last Year:   . Arboriculturist in the Last Year:   Transportation Needs:   . Film/video editor (Medical):   Marland Kitchen Lack of Transportation (Non-Medical):   Physical Activity:   . Days of Exercise per Week:   . Minutes  of Exercise per Session:   Stress:   . Feeling of Stress :   Social Connections:   . Frequency of Communication with Friends and Family:   . Frequency of Social Gatherings with Friends and Family:   . Attends Religious Services:   . Active Member of Clubs or Organizations:   . Attends Archivist Meetings:   Marland Kitchen Marital Status:    Allergies  Allergen Reactions  . Gabapentin Other (See Comments)    Clouded mentation   Family History  Problem Relation Age of Onset  . Heart disease Mother   . Heart disease Father   . Hypertension Father      Current Outpatient Medications (Cardiovascular):  .  rosuvastatin (CRESTOR) 20 MG tablet, TAKE 1 TABLET(20  MG) BY MOUTH DAILY AT 6 PM .  sildenafil (VIAGRA) 100 MG tablet, TAKE 1/2 TO 1 TABLET BY MOUTH DAILY AS NEEDED FOR ERECTILE DYSFUNCTION. (Patient taking differently: Take 50 mg by mouth as needed for erectile dysfunction. )   Current Outpatient Medications (Analgesics):  .  rizatriptan (MAXALT) 10 MG tablet, TAKE 1 TAB AT ONSET OF SYMPTOMS, MAY REPEAT IN 2 HOURS.DO NOT EXCEED 2 TABS/24 HRS.  Current Outpatient Medications (Hematological):  .  clopidogrel (PLAVIX) 75 MG tablet, TAKE 1 TABLET(75 MG) BY MOUTH DAILY  Current Outpatient Medications (Other):  Marland Kitchen  LORazepam (ATIVAN) 0.5 MG tablet, TAKE ONE TABLET TWICE A DAY AS NEEDED FOR ANXIETy (Patient taking differently: Take 0.5 mg by mouth as needed for anxiety. TAKE ONE TABLET TWICE A DAY AS NEEDED FOR ANXIETy) .  Multiple Vitamin (MULTIVITAMIN WITH MINERALS) TABS tablet, Take 1 tablet by mouth daily. Marland Kitchen  gabapentin (NEURONTIN) 100 MG capsule, Take 2 capsules (200 mg total) by mouth at bedtime.   Reviewed prior external information including notes and imaging from  primary care provider As well as notes that were available from care everywhere and other healthcare systems.  Past medical history, social, surgical and family history all reviewed in electronic medical record.  Nash pertanent information unless stated regarding to the chief complaint.   Review of Systems:  Nash headache, visual changes, nausea, vomiting, diarrhea, constipation, dizziness, abdominal pain, skin rash, fevers, chills, night sweats, weight loss, swollen lymph nodes, body aches, joint swelling, chest pain, shortness of breath, mood changes. POSITIVE muscle aches  Objective  Blood pressure (!) 138/86, pulse 81, height 5\' 11"  (1.803 m), weight 186 lb (84.4 kg), SpO2 95 %.   General: Nash apparent distress alert and oriented x3 mood and affect normal, dressed appropriately.  HEENT: Pupils equal, extraocular movements intact  Respiratory: Patient's speak in full sentences  and does not appear short of breath  Cardiovascular: Nash lower extremity edema, non tender, Nash erythema  Neuro: Cranial nerves II through XII are intact, neurovascularly intact in all extremities with 2+ DTRs and 2+ pulses.  Gait normal with good balance and coordination.  MSK: Neck exam shows that there is a loss of lordosis.  Patient has significant decrease in sidebending and rotation to the left.  Patient does have some mild crepitus.  Positive Spurling's but actually radicular symptoms in the C7 distribution on the left side.  Right shoulder exam shows the patient does have positive impingement with Neer and Hawkins.  Rotator cuff strength though seems to be intact.  Mild positive crossover and minimal discomfort over the acromioclavicular joint  Limited musculoskeletal ultrasound was performed and interpreted by Lyndal Pulley  Limited ultrasound of patient's shoulder shows that patient does have  a new what appears to be small acute partial thickness tear of the subscapularis.  Patient's surgical repair did supraspinatus appears normal.  Improvement in the acromioclavicular joint as well.  Bicep tendon is not in the bicipital groove looks like surgical intervention Impression: Small subscapularis tear    Impression and Recommendations:     The above documentation has been reviewed and is accurate and complete Lyndal Pulley, DO       Note: This dictation was prepared with Dragon dictation along with smaller phrase technology. Any transcriptional errors that result from this process are unintentional.

## 2019-08-29 ENCOUNTER — Encounter: Payer: Self-pay | Admitting: Family Medicine

## 2019-08-29 DIAGNOSIS — M75101 Unspecified rotator cuff tear or rupture of right shoulder, not specified as traumatic: Secondary | ICD-10-CM | POA: Insufficient documentation

## 2019-08-29 NOTE — Assessment & Plan Note (Signed)
Known cervical stenosis.  Discussed potentially formal physical therapy which is helped in the past and I do believe that epidural would be potentially beneficial at some point.  Gabapentin restarted him taking 100 mg at night written prescription for 212 outpatient to titrate appropriately.  Follow-up again in 4 to 8 weeks

## 2019-08-29 NOTE — Assessment & Plan Note (Signed)
Appears that patient's rotator cuff tears of the subscapularis but this is low-grade and small.  Likely hopefully will improve with physical therapy.  Do not need to do anything more aggressive at this time.  Worsening symptoms consider injection first before intense imaging.

## 2019-09-11 ENCOUNTER — Ambulatory Visit: Payer: BLUE CROSS/BLUE SHIELD | Admitting: Physical Therapy

## 2019-09-13 ENCOUNTER — Ambulatory Visit: Payer: BLUE CROSS/BLUE SHIELD | Admitting: Physical Therapy

## 2019-09-19 ENCOUNTER — Ambulatory Visit: Payer: BLUE CROSS/BLUE SHIELD | Admitting: Physical Therapy

## 2019-10-01 DIAGNOSIS — L821 Other seborrheic keratosis: Secondary | ICD-10-CM | POA: Diagnosis not present

## 2019-10-01 DIAGNOSIS — L723 Sebaceous cyst: Secondary | ICD-10-CM | POA: Diagnosis not present

## 2019-10-09 ENCOUNTER — Ambulatory Visit: Payer: BLUE CROSS/BLUE SHIELD | Admitting: Family Medicine

## 2019-10-09 ENCOUNTER — Other Ambulatory Visit: Payer: Self-pay

## 2019-10-09 ENCOUNTER — Encounter: Payer: Self-pay | Admitting: Family Medicine

## 2019-10-09 DIAGNOSIS — M75111 Incomplete rotator cuff tear or rupture of right shoulder, not specified as traumatic: Secondary | ICD-10-CM | POA: Diagnosis not present

## 2019-10-09 NOTE — Progress Notes (Signed)
[name] Phone: [phone] Subjective:   [name] No, am serving as a scribe for [name]. This visit occurred during the SARS-CoV-2 public health emergency.  Safety protocols were in place, including screening questions prior to the visit, additional usage of staff PPE, and extensive cleaning of exam room while observing appropriate contact time as indicated for disinfecting solutions.   I'm seeing this patient by the request  of:  [name], MD  CC: Right shoulder pain follow-up  [id number]   08/28/2019 Appears that patient's rotator cuff tears of the subscapularis but this is low-grade and small.  Likely hopefully will improve with physical therapy.  Do not need to do anything more aggressive at this time.  Worsening symptoms consider injection first before intense imaging.  Known cervical stenosis.  Discussed potentially formal physical therapy which is helped in the past and I do believe that epidural would be potentially beneficial at some point.  Gabapentin restarted him taking 100 mg at night written prescription for 212 outpatient to titrate appropriately.  Follow-up again in 4 to 8 weeks  Update 10/09/2019 Harry Nash is a 64 y.o. male coming in with complaint of neck and right shoulder pain. Patient states that his shoulder pain has improved. Neck pain is also improving. Less muscle spasms and feels he sleeps better.  Patient states that the shoulder is feeling approximately 90% better.  Very mild weakness but nothing else.  Patient feels like he is making progress and now does have time for himself to start doing the exercises       Past Medical History:  Diagnosis Date  . Depression   . Headache(784.0)   . Kidney stones   . TIA (transient ischemic attack)    Past Surgical History:  Procedure Laterality Date  . HERNIA REPAIR    . ROTATOR CUFF REPAIR     Social History    Socioeconomic History  . Marital status: Married    Spouse name: Not on file  . Number of children: 3  . Years of education: Not on file  . Highest education level: Not on file  Occupational History  . Not on file  Tobacco Use  . Smoking status: Former Smoker    Quit date: 02/01/2003    Years since quitting: 16.6  . Smokeless tobacco: Never Used  Vaping Use  . Vaping Use: Never used  Substance and Sexual Activity  . Alcohol use: Yes    Alcohol/week: 1.0 standard drink    Types: 1 Cans of beer per week    Comment: moderate  . Drug use: No  . Sexual activity: Yes  Other Topics Concern  . Not on file  Social History Narrative  . Not on file   Social Determinants of Health   Financial Resource Strain:   . Difficulty of Paying Living Expenses: Not on file  Food Insecurity:   . Worried About Charity fundraiser in the Last Year: Not on file  . Ran Out of Food in the Last Year: Not on file  Transportation Needs:   . Lack of Transportation (Medical): Not on file  . Lack of Transportation (Non-Medical): Not on file  Physical Activity:   . Days of Exercise per Week: Not on file  . Minutes of Exercise per Session: Not on file  Stress:   . Feeling of Stress : Not on file  Social Connections:   . Frequency  of Communication with Friends and Family: Not on file  . Frequency of Social Gatherings with Friends and Family: Not on file  . Attends Religious Services: Not on file  . Active Member of Clubs or Organizations: Not on file  . Attends Archivist Meetings: Not on file  . Marital Status: Not on file   Allergies  Allergen Reactions  . Gabapentin Other (See Comments)    Clouded mentation   Family History  Problem Relation Age of Onset  . Heart disease Mother   . Heart disease Father   . Hypertension Father      Current Outpatient Medications (Cardiovascular):  .  rosuvastatin (CRESTOR) 20 MG tablet, TAKE 1 TABLET(20 MG) BY MOUTH DAILY AT 6 PM .   sildenafil (VIAGRA) 100 MG tablet, TAKE 1/2 TO 1 TABLET BY MOUTH DAILY AS NEEDED FOR ERECTILE DYSFUNCTION. (Patient taking differently: Take 50 mg by mouth as needed for erectile dysfunction. )   Current Outpatient Medications (Analgesics):  .  rizatriptan (MAXALT) 10 MG tablet, TAKE 1 TAB AT ONSET OF SYMPTOMS, MAY REPEAT IN 2 HOURS.DO NOT EXCEED 2 TABS/24 HRS.  Current Outpatient Medications (Hematological):  .  clopidogrel (PLAVIX) 75 MG tablet, TAKE 1 TABLET(75 MG) BY MOUTH DAILY  Current Outpatient Medications (Other):  .  gabapentin (NEURONTIN) 100 MG capsule, Take 2 capsules (200 mg total) by mouth at bedtime. Marland Kitchen  LORazepam (ATIVAN) 0.5 MG tablet, TAKE ONE TABLET TWICE A DAY AS NEEDED FOR ANXIETy (Patient taking differently: Take 0.5 mg by mouth as needed for anxiety. TAKE ONE TABLET TWICE A DAY AS NEEDED FOR ANXIETy) .  Multiple Vitamin (MULTIVITAMIN WITH MINERALS) TABS tablet, Take 1 tablet by mouth daily.   Reviewed prior external information including notes and imaging from  primary care provider As well as notes that were available from care everywhere and other healthcare systems.  Past medical history, social, surgical and family history all reviewed in electronic medical record.  No pertanent information unless stated regarding to the chief complaint.   Review of Systems:  No headache, visual changes, nausea, vomiting, diarrhea, constipation, dizziness, abdominal pain, skin rash, fevers, chills, night sweats, weight loss, swollen lymph nodes, body aches, joint swelling, chest pain, shortness of breath, mood changes. POSITIVE muscle aches  Objective  Blood pressure 118/88, pulse 78, height 5\' 11"  (1.803 m), weight 185 lb (83.9 kg), SpO2 97 %.   General: No apparent distress alert and oriented x3 mood and affect normal, dressed appropriately.  HEENT: Pupils equal, extraocular movements intact  Respiratory: Patient's speak in full sentences and does not appear short of breath    Cardiovascular: No lower extremity edema, non tender, no erythema  Neuro: Cranial nerves II through XII are intact, neurovascularly intact in all extremities with 2+ DTRs and 2+ pulses.  Gait normal with good balance and coordination.  MSK: Right shoulder exam shows the patient does have some improvement in range of motion, very minimal discomfort with empty can but 5-5 strength.  Very mild impingement still noted with Hawkins.    Impression and Recommendations:     The above documentation has been reviewed and is accurate and complete Lyndal Pulley, DO       Note: This dictation was prepared with Dragon dictation along with smaller phrase technology. Any transcriptional errors that result from this process are unintentional.

## 2019-10-09 NOTE — Assessment & Plan Note (Signed)
Patient seems to be doing much better overall.  Patient has very minimal discomfort.  Encourage patient to use the gabapentin more on an as-needed basis.  Discussed icing regimen and home exercise, increase activity slowly.  Follow-up again as needed

## 2019-12-11 ENCOUNTER — Other Ambulatory Visit: Payer: Self-pay | Admitting: Family Medicine

## 2019-12-19 DIAGNOSIS — L814 Other melanin hyperpigmentation: Secondary | ICD-10-CM | POA: Diagnosis not present

## 2019-12-19 DIAGNOSIS — Z85828 Personal history of other malignant neoplasm of skin: Secondary | ICD-10-CM | POA: Diagnosis not present

## 2019-12-19 DIAGNOSIS — D225 Melanocytic nevi of trunk: Secondary | ICD-10-CM | POA: Diagnosis not present

## 2019-12-19 DIAGNOSIS — L821 Other seborrheic keratosis: Secondary | ICD-10-CM | POA: Diagnosis not present

## 2020-02-13 DIAGNOSIS — Z1152 Encounter for screening for COVID-19: Secondary | ICD-10-CM | POA: Diagnosis not present

## 2020-02-20 NOTE — Progress Notes (Signed)
[facility] [address] [place] Phone: [phone] Subjective:   I [name] am serving as a Education administrator for [name].  This visit occurred during the SARS-CoV-2 public health emergency.  Safety protocols were in place, including screening questions prior to the visit, additional usage of staff PPE, and extensive cleaning of exam room while observing appropriate contact time as indicated for disinfecting solutions.   I'm seeing this patient by the request  of:  [name], MD  CC: Left shoulder pain  [id number]   10/09/2019 Patient seems to be doing much better overall.  Patient has very minimal discomfort.  Encourage patient to use the gabapentin more on an as-needed basis.  Discussed icing regimen and home exercise, increase activity slowly.  Follow-up again as needed  Update 02/21/2020 Lamorris Knoblock Savich is a 65 y.o. male coming in with complaint of left shoulder injury. Last seen for right RTC tear. Patient states he fell on ice and landed directly on the shoulder. States the next days the shoulder got worse and his ROM is limited.  Patient states that it is fairly severe.  Unfortunately feels somewhat similar to his contralateral side but did have a rotator cuff tear.  Patient denies any radiation down the arm, denies weakness but has not tried doing much yet     Past Medical History:  Diagnosis Date   Depression    Headache(784.0)    Kidney stones    TIA (transient ischemic attack)    Past Surgical History:  Procedure Laterality Date   HERNIA REPAIR     ROTATOR CUFF REPAIR     Social History   Socioeconomic History   Marital status: Married    Spouse name: Not on file   Number of children: 3   Years of education: Not on file   Highest education level: Not on file  Occupational History   Not on file  Tobacco Use   Smoking status: Former Smoker    Quit date: 02/01/2003    Years since  quitting: 17.0   Smokeless tobacco: Never Used  Vaping Use   Vaping Use: Never used  Substance and Sexual Activity   Alcohol use: Yes    Alcohol/week: 1.0 standard drink    Types: 1 Cans of beer per week    Comment: moderate   Drug use: No   Sexual activity: Yes  Other Topics Concern   Not on file  Social History Narrative   Not on file   Social Determinants of Health   Financial Resource Strain: Not on file  Food Insecurity: Not on file  Transportation Needs: Not on file  Physical Activity: Not on file  Stress: Not on file  Social Connections: Not on file   Allergies  Allergen Reactions   Gabapentin Other (See Comments)    Clouded mentation   Family History  Problem Relation Age of Onset   Heart disease Mother    Heart disease Father    Hypertension Father      Current Outpatient Medications (Cardiovascular):    rosuvastatin (CRESTOR) 20 MG tablet, TAKE 1 TABLET(20 MG) BY MOUTH DAILY AT 6 PM   sildenafil (VIAGRA) 100 MG tablet, TAKE 1/2 TO 1 TABLET BY MOUTH DAILY AS NEEDED FOR ERECTILE DYSFUNCTION. (Patient taking differently: Take 50 mg by mouth as needed for erectile dysfunction.)   Current Outpatient Medications (Analgesics):    rizatriptan (MAXALT) 10 MG tablet, TAKE 1 TAB AT ONSET OF SYMPTOMS,  MAY REPEAT IN 2 HOURS.DO NOT EXCEED 2 TABS/24 HRS.  Current Outpatient Medications (Hematological):    clopidogrel (PLAVIX) 75 MG tablet, TAKE 1 TABLET(75 MG) BY MOUTH DAILY  Current Outpatient Medications (Other):    gabapentin (NEURONTIN) 100 MG capsule, TAKE 2 CAPSULES(200 MG) BY MOUTH AT BEDTIME   LORazepam (ATIVAN) 0.5 MG tablet, TAKE ONE TABLET TWICE A DAY AS NEEDED FOR ANXIETy (Patient taking differently: Take 0.5 mg by mouth as needed for anxiety. TAKE ONE TABLET TWICE A DAY AS NEEDED FOR ANXIETy)   Multiple Vitamin (MULTIVITAMIN WITH MINERALS) TABS tablet, Take 1 tablet by mouth daily.   Reviewed prior external information including notes  and imaging from  primary care provider As well as notes that were available from care everywhere and other healthcare systems.  Past medical history, social, surgical and family history all reviewed in electronic medical record.  No pertanent information unless stated regarding to the chief complaint.   Review of Systems:  No headache, visual changes, nausea, vomiting, diarrhea, constipation, dizziness, abdominal pain, skin rash, fevers, chills, night sweats, weight loss, swollen lymph nodes, body aches, joint swelling, chest pain, shortness of breath, mood changes. POSITIVE muscle aches  Objective  Blood pressure 116/80, pulse 75, height 5\' 11"  (1.803 m), weight 186 lb (84.4 kg), SpO2 97 %.   General: No apparent distress alert and oriented x3 mood and affect normal, dressed appropriately.  HEENT: Pupils equal, extraocular movements intact  Respiratory: Patient's speak in full sentences and does not appear short of breath  Cardiovascular: No lower extremity edema, non tender, no erythema  Gait normal with good balance and coordination.  MSK: Left shoulder shows the patient does have fairly good range of motion.  Patient though does have pain with internal and external range of motion.  Patient does have 4-5 strength of the rotator cuff compared to the contralateral side.  Mild positive crossover.  Swelling noted over the acromioclavicular joint.  No tenderness over the distal clavicle.  Good grip strength.  Limited musculoskeletal ultrasound was performed and interpreted by Lyndal Pulley  Limited ultrasound shows the patient does have hypoechoic changes and a questionable acute tear of the anterior bundle of the supraspinatus.  Subscapularis appears to be unremarkable.  Mild irregularity of the proximal humerus but no true cortical defect noted hypoechoic changes consistent with a fusion of the acromioclavicular joint Impression: Questionable small partial-thickness tear of the supraspinatus  and acute with likely grade 1 AC sprain    Impression and Recommendations:     The above documentation has been reviewed and is accurate and complete Lyndal Pulley, DO

## 2020-02-21 ENCOUNTER — Other Ambulatory Visit: Payer: Self-pay

## 2020-02-21 ENCOUNTER — Ambulatory Visit: Payer: Self-pay

## 2020-02-21 ENCOUNTER — Ambulatory Visit: Payer: BLUE CROSS/BLUE SHIELD | Admitting: Family Medicine

## 2020-02-21 ENCOUNTER — Encounter: Payer: Self-pay | Admitting: Family Medicine

## 2020-02-21 ENCOUNTER — Ambulatory Visit (INDEPENDENT_AMBULATORY_CARE_PROVIDER_SITE_OTHER): Payer: BLUE CROSS/BLUE SHIELD

## 2020-02-21 VITALS — BP 116/80 | HR 75 | Ht 71.0 in | Wt 186.0 lb

## 2020-02-21 DIAGNOSIS — G8929 Other chronic pain: Secondary | ICD-10-CM | POA: Diagnosis not present

## 2020-02-21 DIAGNOSIS — M25512 Pain in left shoulder: Secondary | ICD-10-CM | POA: Diagnosis not present

## 2020-02-21 DIAGNOSIS — M75102 Unspecified rotator cuff tear or rupture of left shoulder, not specified as traumatic: Secondary | ICD-10-CM | POA: Insufficient documentation

## 2020-02-21 DIAGNOSIS — S46012A Strain of muscle(s) and tendon(s) of the rotator cuff of left shoulder, initial encounter: Secondary | ICD-10-CM | POA: Diagnosis not present

## 2020-02-21 NOTE — Assessment & Plan Note (Signed)
Patient does have what appears to be a acute tear noted with some reactive bursitis noted.  Seems to be the supraspinatus.  He certainly seems to be partial.  Discussed icing regimen, home exercises and topical anti-inflammatories.  Patient will get x-rays to rule out anything such as a occult fracture.  Encourage patient to work hard and patient wants to avoid surgery like the contralateral side.  Follow-up with me again in 5 to 6 weeks.  Patient declined formal physical therapy

## 2020-02-21 NOTE — Patient Instructions (Addendum)
Good to see you Appears to be acute on chronic tear Xray today Exercise 3 times a week start on Monday Arnica or voltaren gel twice daily to the shoulder  Ice 20 mins 2-3 times a day Limit lifting to 20 lbs max See me again in 5-6 weeks

## 2020-04-02 NOTE — Progress Notes (Signed)
[facility] [address] [place] Phone: [phone] Subjective:   I [name] am serving as a Education administrator for [name].  This visit occurred during the SARS-CoV-2 public health emergency.  Safety protocols were in place, including screening questions prior to the visit, additional usage of staff PPE, and extensive cleaning of exam room while observing appropriate contact time as indicated for disinfecting solutions.   I'm seeing this patient by the request  of:  [name], MD  CC: Left shoulder pain follow-up  [id number]   02/21/2020 Patient does have what appears to be a acute tear noted with some reactive bursitis noted.  Seems to be the supraspinatus.  He certainly seems to be partial.  Discussed icing regimen, home exercises and topical anti-inflammatories.  Patient will get x-rays to rule out anything such as a occult fracture.  Encourage patient to work hard and patient wants to avoid surgery like the contralateral side.  Follow-up with me again in 5 to 6 weeks.  Patient declined formal physical therapy  Update 04/03/2020 Desiderio Dolata Avery is a 65 y.o. male coming in with complaint of left shoulder pain. Patient states the shoulder is worse. States he tries not to do too much. Still challenging to lift overhead.  Patient states that actually no significant improvement overall and still having pain even at night.  Patient denies any radiation down the arm.  Patient states some mild increase in neck pain but thinks is how he is compensating.     Past Medical History:  Diagnosis Date  . Depression   . Headache(784.0)   . Kidney stones   . TIA (transient ischemic attack)    Past Surgical History:  Procedure Laterality Date  . HERNIA REPAIR    . ROTATOR CUFF REPAIR     Social History   Socioeconomic History  . Marital status: Married    Spouse name: Not on file  . Number of children: 3  . Years of education:  Not on file  . Highest education level: Not on file  Occupational History  . Not on file  Tobacco Use  . Smoking status: Former Smoker    Quit date: 02/01/2003    Years since quitting: 17.1  . Smokeless tobacco: Never Used  Vaping Use  . Vaping Use: Never used  Substance and Sexual Activity  . Alcohol use: Yes    Alcohol/week: 1.0 standard drink    Types: 1 Cans of beer per week    Comment: moderate  . Drug use: No  . Sexual activity: Yes  Other Topics Concern  . Not on file  Social History Narrative  . Not on file   Social Determinants of Health   Financial Resource Strain: Not on file  Food Insecurity: Not on file  Transportation Needs: Not on file  Physical Activity: Not on file  Stress: Not on file  Social Connections: Not on file   Allergies  Allergen Reactions  . Gabapentin Other (See Comments)    Clouded mentation   Family History  Problem Relation Age of Onset  . Heart disease Mother   . Heart disease Father   . Hypertension Father      Current Outpatient Medications (Cardiovascular):  .  rosuvastatin (CRESTOR) 20 MG tablet, TAKE 1 TABLET(20 MG) BY MOUTH DAILY AT 6 PM .  sildenafil (VIAGRA) 100 MG tablet, TAKE 1/2 TO 1 TABLET BY MOUTH DAILY AS NEEDED FOR ERECTILE DYSFUNCTION. (Patient taking differently:  Take 50 mg by mouth as needed for erectile dysfunction.)   Current Outpatient Medications (Analgesics):  .  rizatriptan (MAXALT) 10 MG tablet, TAKE 1 TAB AT ONSET OF SYMPTOMS, MAY REPEAT IN 2 HOURS.DO NOT EXCEED 2 TABS/24 HRS.  Current Outpatient Medications (Hematological):  .  clopidogrel (PLAVIX) 75 MG tablet, TAKE 1 TABLET(75 MG) BY MOUTH DAILY  Current Outpatient Medications (Other):  .  gabapentin (NEURONTIN) 100 MG capsule, TAKE 2 CAPSULES(200 MG) BY MOUTH AT BEDTIME .  LORazepam (ATIVAN) 0.5 MG tablet, TAKE ONE TABLET TWICE A DAY AS NEEDED FOR ANXIETy (Patient taking differently: Take 0.5 mg by mouth as needed for anxiety. TAKE ONE TABLET TWICE  A DAY AS NEEDED FOR ANXIETy) .  Multiple Vitamin (MULTIVITAMIN WITH MINERALS) TABS tablet, Take 1 tablet by mouth daily.   Reviewed prior external information including notes and imaging from  primary care provider As well as notes that were available from care everywhere and other healthcare systems.  Past medical history, social, surgical and family history all reviewed in electronic medical record.  No pertanent information unless stated regarding to the chief complaint.   Review of Systems:  No headache, visual changes, nausea, vomiting, diarrhea, constipation, dizziness, abdominal pain, skin rash, fevers, chills, night sweats, weight loss, swollen lymph nodes,  joint swelling, chest pain, shortness of breath, mood changes. POSITIVE muscle aches, body aches  Objective  Blood pressure 120/86, pulse 82, height 5\' 11"  (1.803 m), weight 186 lb (84.4 kg), SpO2 98 %.   General: No apparent distress alert and oriented x3 mood and affect normal, dressed appropriately.  HEENT: Pupils equal, extraocular movements intact  Respiratory: Patient's speak in full sentences and does not appear short of breath  Cardiovascular: No lower extremity edema, non tender, no erythema  Gait normal with good balance and coordination.  MSK: Left shoulder exam shows the patient does have some loss of motion in all planes of the least 5 degrees.  Patient does have positive impingement with Neer and Hawkins.  Rotator cuff 4 out of 5 strength compared to contralateral side.  Patient does have a lot of voluntary guarding on exam today.  Limited musculoskeletal ultrasound was performed and interpreted byt Lyndal Pulley  Limited ultrasound of patient's right shoulder shows the patient does have some degenerative tearing and potential mild retraction of the supraspinatus but seems to be partial thickness.  Patient does have some calcific changes of the posterior labrum noted.  Acromioclavicular joint does have a trace  effusion noted and mild arthritic changes. Impression: Continued changes of labral pathology.  As well as rotator cuff pathology.   Impression and Recommendations:     The above documentation has been reviewed and is accurate and complete Lyndal Pulley, DO

## 2020-04-03 ENCOUNTER — Ambulatory Visit: Payer: Self-pay

## 2020-04-03 ENCOUNTER — Encounter: Payer: Self-pay | Admitting: Family Medicine

## 2020-04-03 ENCOUNTER — Ambulatory Visit: Payer: BLUE CROSS/BLUE SHIELD | Admitting: Family Medicine

## 2020-04-03 ENCOUNTER — Other Ambulatory Visit: Payer: Self-pay

## 2020-04-03 VITALS — BP 120/86 | HR 82 | Ht 71.0 in | Wt 186.0 lb

## 2020-04-03 DIAGNOSIS — M25512 Pain in left shoulder: Secondary | ICD-10-CM

## 2020-04-03 DIAGNOSIS — G8929 Other chronic pain: Secondary | ICD-10-CM

## 2020-04-03 DIAGNOSIS — S46012D Strain of muscle(s) and tendon(s) of the rotator cuff of left shoulder, subsequent encounter: Secondary | ICD-10-CM | POA: Diagnosis not present

## 2020-04-03 NOTE — Patient Instructions (Signed)
Good to see you Left shoulder MRA  We will write you in mychart when we get it back Will discuss if PRP is possible

## 2020-04-03 NOTE — Assessment & Plan Note (Signed)
Patient continues to have weakness with empty can test.  Patient continues to actually have significant amount of pain.  Does have some mild limited range of motion.  MR arthrogram ordered today to further evaluate for possible rotator cuff and labral pathology.  Ultrasound does show likely small partial tears of the supraspinatus and patient could be a candidate for potential PRP but would like the advanced imaging first for further evaluation.

## 2020-04-30 ENCOUNTER — Ambulatory Visit
Admission: RE | Admit: 2020-04-30 | Discharge: 2020-04-30 | Disposition: A | Discharge status: Home / Self Care | Payer: BLUE CROSS/BLUE SHIELD | Source: Ambulatory Visit | Attending: Family Medicine | Admitting: Family Medicine

## 2020-04-30 DIAGNOSIS — G8929 Other chronic pain: Secondary | ICD-10-CM

## 2020-04-30 DIAGNOSIS — S46011A Strain of muscle(s) and tendon(s) of the rotator cuff of right shoulder, initial encounter: Secondary | ICD-10-CM | POA: Diagnosis not present

## 2020-04-30 DIAGNOSIS — M25512 Pain in left shoulder: Secondary | ICD-10-CM

## 2020-04-30 MED ORDER — IOPAMIDOL (ISOVUE-M 200) INJECTION 41%
12.0000 mL | Freq: Once | INTRAMUSCULAR | Status: AC
  Administered 2020-04-30: 12 mL via INTRA_ARTICULAR

## 2020-05-01 ENCOUNTER — Encounter: Payer: Self-pay | Admitting: Family Medicine

## 2020-05-04 ENCOUNTER — Other Ambulatory Visit: Payer: Self-pay

## 2020-05-04 DIAGNOSIS — M75121 Complete rotator cuff tear or rupture of right shoulder, not specified as traumatic: Secondary | ICD-10-CM

## 2020-05-07 ENCOUNTER — Encounter: Payer: Self-pay | Admitting: Family Medicine

## 2020-05-12 ENCOUNTER — Other Ambulatory Visit: Payer: Self-pay | Admitting: Family Medicine

## 2020-05-13 DIAGNOSIS — M75122 Complete rotator cuff tear or rupture of left shoulder, not specified as traumatic: Secondary | ICD-10-CM | POA: Diagnosis not present

## 2020-05-25 ENCOUNTER — Other Ambulatory Visit: Payer: Self-pay | Admitting: Family Medicine

## 2020-05-28 DIAGNOSIS — H5213 Myopia, bilateral: Secondary | ICD-10-CM | POA: Diagnosis not present

## 2020-06-01 ENCOUNTER — Encounter: Payer: Self-pay | Admitting: Family Medicine

## 2020-06-09 DIAGNOSIS — M7542 Impingement syndrome of left shoulder: Secondary | ICD-10-CM | POA: Diagnosis not present

## 2020-06-09 DIAGNOSIS — S43432A Superior glenoid labrum lesion of left shoulder, initial encounter: Secondary | ICD-10-CM | POA: Diagnosis not present

## 2020-06-09 DIAGNOSIS — G8918 Other acute postprocedural pain: Secondary | ICD-10-CM | POA: Diagnosis not present

## 2020-06-09 DIAGNOSIS — M659 Synovitis and tenosynovitis, unspecified: Secondary | ICD-10-CM | POA: Diagnosis not present

## 2020-06-09 DIAGNOSIS — M75122 Complete rotator cuff tear or rupture of left shoulder, not specified as traumatic: Secondary | ICD-10-CM | POA: Diagnosis not present

## 2020-06-09 DIAGNOSIS — M94212 Chondromalacia, left shoulder: Secondary | ICD-10-CM | POA: Diagnosis not present

## 2020-06-16 DIAGNOSIS — M75122 Complete rotator cuff tear or rupture of left shoulder, not specified as traumatic: Secondary | ICD-10-CM | POA: Diagnosis not present

## 2020-06-24 DIAGNOSIS — M25612 Stiffness of left shoulder, not elsewhere classified: Secondary | ICD-10-CM | POA: Diagnosis not present

## 2020-06-24 DIAGNOSIS — R531 Weakness: Secondary | ICD-10-CM | POA: Diagnosis not present

## 2020-06-24 DIAGNOSIS — M75102 Unspecified rotator cuff tear or rupture of left shoulder, not specified as traumatic: Secondary | ICD-10-CM | POA: Diagnosis not present

## 2020-07-13 DIAGNOSIS — M25512 Pain in left shoulder: Secondary | ICD-10-CM | POA: Diagnosis not present

## 2020-07-20 ENCOUNTER — Other Ambulatory Visit: Payer: Self-pay

## 2020-07-20 ENCOUNTER — Ambulatory Visit: Payer: BLUE CROSS/BLUE SHIELD | Admitting: Family Medicine

## 2020-07-20 ENCOUNTER — Encounter: Payer: Self-pay | Admitting: Family Medicine

## 2020-07-20 DIAGNOSIS — Z8673 Personal history of transient ischemic attack (TIA), and cerebral infarction without residual deficits: Secondary | ICD-10-CM | POA: Insufficient documentation

## 2020-07-20 DIAGNOSIS — Z0000 Encounter for general adult medical examination without abnormal findings: Secondary | ICD-10-CM | POA: Diagnosis not present

## 2020-07-20 DIAGNOSIS — E7800 Pure hypercholesterolemia, unspecified: Secondary | ICD-10-CM | POA: Diagnosis not present

## 2020-07-20 DIAGNOSIS — Z125 Encounter for screening for malignant neoplasm of prostate: Secondary | ICD-10-CM

## 2020-07-20 DIAGNOSIS — M25512 Pain in left shoulder: Secondary | ICD-10-CM | POA: Diagnosis not present

## 2020-07-20 MED ORDER — VARICELLA VIRUS VACCINE LIVE 1350 PFU/0.5ML IJ SUSR: 0.5000 mL | Freq: Once | INTRAMUSCULAR | 1 refills | Status: AC

## 2020-07-20 NOTE — Assessment & Plan Note (Signed)
Aware of I recommendation.  Desires screen

## 2020-07-20 NOTE — Assessment & Plan Note (Addendum)
Healthy male with no at risk behaviors.   Major chronic problem is secondary ASCVD risk reduction. Needs 4th COVID, both shingrix and likely a colonoscopy to catch up on HPDP recommendations.

## 2020-07-20 NOTE — Progress Notes (Signed)
    SUBJECTIVE:   CHIEF COMPLAINT / HPI:   Annual exam. Global: He is at a really nice moment in life.  Two kids age 65 and 25.  He home schooled both last year with COVID.  Cherishes the relationship with them and his loving wife.  All is good.    Has now had surgery on both shoulders and had inguinal hernia repaired.   Acute issues: None Chronic issues: Plans to see urology for likely vasectomy. History of TIA.  Secondary prevention on statin and clopidigrol.  No recurrent sx.    HPDP: Needs lipids and liver testing. Needs 4th Covid shot - and we need records of the first three Never had shingrix.   Desires PSA testing.   Hx of colon polyps.  Seen previously by [facility]  My last recorded colonoscopy was 03/2011.  Patient is pretty sure he had an interim colonoscopy. Healthy weight, healthy diet, active with exercise   PERTINENT  PMH / PSH: Denies CP, DOE, Abd pain, worrisome skin concerns, bleeding.  No change in bowel, bladder, wt or appetite.  OBJECTIVE:   BP 114/68   Pulse 81   Ht 5\' 11"  (1.803 m)   Wt 184 lb 9.6 oz (83.7 kg)   SpO2 98%   BMI 25.75 kg/m   Lungs clear Cardiac RRR  Abd benign Ext no swelling Neuro: Motor, sensory, gait, affect and mentation and grossly normal.  ASSESSMENT/PLAN:   Routine general medical examination at a health care facility Healthy male with no at risk behaviors.   Major chronic problem is secondary ASCVD risk reduction. Needs 4th COVID, both shingrix and likely a colonoscopy to catch up on HPDP recommendations.  Prostate cancer screening Aware of I recommendation.  Desires screen  HYPERCHOLESTEROLEMIA Check lipids on statin.     [name], MD [place]

## 2020-07-20 NOTE — Assessment & Plan Note (Addendum)
Check lipids on statin. Goal LDL<70.  Called patient and we will increase crestor to 40 mg daily.  Recheck direct LDL in 3 months.

## 2020-07-20 NOTE — Patient Instructions (Addendum)
I sent in a prescription for the shingles vaccine.  Remember, get it at a time when it is OK to be sick for a day or two. I will need your COVID vaccine records and we can give you the 4th shot (2nd booster here.)  Call [facility] and try to get your colonoscopy this year.  It is officially due 03/2021 (by my records, your last colonoscopy was 03/2011).  Let me know if I am missing a colonoscopy.   I will call with blood test results. Keep up the good work. Enjoy those kids.

## 2020-07-21 LAB — CMP14+EGFR
ALT: 25 IU/L (ref 0–44)
AST: 22 IU/L (ref 0–40)
Albumin/Globulin Ratio: 2.1 (ref 1.2–2.2)
Albumin: 4.5 g/dL (ref 3.8–4.8)
Alkaline Phosphatase: 76 IU/L (ref 44–121)
BUN/Creatinine Ratio: 15 (ref 10–24)
BUN: 14 mg/dL (ref 8–27)
Bilirubin Total: 0.6 mg/dL (ref 0.0–1.2)
CO2: 25 mmol/L (ref 20–29)
Calcium: 9.7 mg/dL (ref 8.6–10.2)
Chloride: 99 mmol/L (ref 96–106)
Creatinine, Ser: 0.95 mg/dL (ref 0.76–1.27)
Globulin, Total: 2.1 g/dL (ref 1.5–4.5)
Glucose: 89 mg/dL (ref 65–99)
Potassium: 4.4 mmol/L (ref 3.5–5.2)
Sodium: 139 mmol/L (ref 134–144)
Total Protein: 6.6 g/dL (ref 6.0–8.5)
eGFR: 89 mL/min/{1.73_m2} (ref >59)

## 2020-07-21 LAB — CBC
Hematocrit: 44.3 % (ref 37.5–51.0)
Hemoglobin: 14.9 g/dL (ref 13.0–17.7)
MCH: 29 pg (ref 26.6–33.0)
MCHC: 33.6 g/dL (ref 31.5–35.7)
MCV: 86 fL (ref 79–97)
Platelets: 218 10*3/uL (ref 150–450)
RBC: 5.14 x10E6/uL (ref 4.14–5.80)
RDW: 11.6 % (ref 11.6–15.4)
WBC: 6.2 10*3/uL (ref 3.4–10.8)

## 2020-07-21 LAB — LIPID PANEL
Chol/HDL Ratio: 2.6 ratio (ref 0.0–5.0)
Cholesterol, Total: 159 mg/dL (ref 100–199)
HDL: 62 mg/dL (ref >39)
LDL Chol Calc (NIH): 81 mg/dL (ref 0–99)
Triglycerides: 86 mg/dL (ref 0–149)
VLDL Cholesterol Cal: 16 mg/dL (ref 5–40)

## 2020-07-21 LAB — PSA: Prostate Specific Ag, Serum: 0.8 ng/mL (ref 0.0–4.0)

## 2020-07-21 MED ORDER — ROSUVASTATIN CALCIUM 40 MG PO TABS: 40.0000 mg | ORAL_TABLET | Freq: Every day | ORAL | 3 refills | Status: DC

## 2020-07-21 NOTE — Addendum Note (Signed)
Addended by: [name] on: 07/21/2020 11:50 AM   Modules accepted: Orders

## 2020-07-27 DIAGNOSIS — M25512 Pain in left shoulder: Secondary | ICD-10-CM | POA: Diagnosis not present

## 2020-08-24 DIAGNOSIS — M25512 Pain in left shoulder: Secondary | ICD-10-CM | POA: Diagnosis not present

## 2020-08-26 DIAGNOSIS — M25512 Pain in left shoulder: Secondary | ICD-10-CM | POA: Diagnosis not present

## 2020-09-01 DIAGNOSIS — M25512 Pain in left shoulder: Secondary | ICD-10-CM | POA: Diagnosis not present

## 2020-09-03 DIAGNOSIS — M25512 Pain in left shoulder: Secondary | ICD-10-CM | POA: Diagnosis not present

## 2020-09-07 DIAGNOSIS — M25512 Pain in left shoulder: Secondary | ICD-10-CM | POA: Diagnosis not present

## 2020-09-12 DIAGNOSIS — Z20822 Contact with and (suspected) exposure to covid-19: Secondary | ICD-10-CM | POA: Diagnosis not present

## 2020-09-19 ENCOUNTER — Telehealth: Payer: Self-pay | Admitting: Family Medicine

## 2020-09-19 DIAGNOSIS — J069 Acute upper respiratory infection, unspecified: Secondary | ICD-10-CM | POA: Diagnosis not present

## 2020-09-19 NOTE — Telephone Encounter (Signed)
**  AFTER HOURS CALL**   Patient's wife answers and reports that her husband (patient) is having bronchitis-like symptoms. She states since her original page, she has been able to arrange a virtual visit through her insurance company and her husband has been prescribed a cough medication and inhaler. They were planning to pick up these prescriptions soon. She reports that when his symptoms began he had mildly elevated HR just above 100 bpm and pulse oximetry readings down to 94%. These readings have since improved to less than 100 bpm and between 96-97%. Wife's questions were answered regarding clarification on the expectations of calling the after hours line. She and patient were thankful for the call.   No further interventions ordered at this time.   Eulis Foster, MD  [facility], PGY-3

## 2020-09-21 ENCOUNTER — Encounter: Payer: Self-pay | Admitting: Family Medicine

## 2020-09-21 DIAGNOSIS — R059 Cough, unspecified: Secondary | ICD-10-CM

## 2020-09-21 DIAGNOSIS — M25512 Pain in left shoulder: Secondary | ICD-10-CM | POA: Diagnosis not present

## 2020-09-23 ENCOUNTER — Ambulatory Visit (INDEPENDENT_AMBULATORY_CARE_PROVIDER_SITE_OTHER): Payer: BLUE CROSS/BLUE SHIELD | Admitting: Family Medicine

## 2020-09-23 ENCOUNTER — Other Ambulatory Visit: Payer: Self-pay

## 2020-09-23 VITALS — BP 132/80 | HR 90 | Temp 96.7°F

## 2020-09-23 DIAGNOSIS — J069 Acute upper respiratory infection, unspecified: Secondary | ICD-10-CM

## 2020-09-23 DIAGNOSIS — M25512 Pain in left shoulder: Secondary | ICD-10-CM | POA: Diagnosis not present

## 2020-09-23 NOTE — Patient Instructions (Addendum)
It was great seeing you today!  Today you came in for cold symptoms for the past 12 days. I do recommend continuing your regimen with the Tessalon Perles, Flonase, Sudafed.  If symptoms do not improve after another week, please give [place] a call at the clinic and we can put in for a chest x-ray.  Feel free to call with any questions or concerns at any time, at [phone].   Take care,  Dr. Shary Key [facility]

## 2020-09-23 NOTE — Progress Notes (Signed)
    SUBJECTIVE:   CHIEF COMPLAINT / HPI:   Harry Nash is a 65 yo who presents for cold symptoms for the past 12 days. Endorses coughing, runny nose. Sometimes productive cough. Occasionally feels it is difficult to breath. Feels the congestion. States his HR was elevated at home. O2 went down to 94%. States he is currently using flonase, sudafed, benzonoate. Medications helping some with cough and with drying up his nose   States child and wife all with similar symptoms   COVID tested yesterday. Have been doing it every other day and has been negative. States he is COVID vaccinated x 2.    OBJECTIVE:   BP 132/80   Pulse 90   Temp (!) 96.7 F (35.9 C)   SpO2 96%    Physical exam  General: well appearing, NAD Cardiovascular: RRR, no murmurs Lungs: CTAB. Normal WOB Abdomen: soft, non-distended, non-tender Skin: warm, dry. No edema  ASSESSMENT/PLAN:   No problem-specific Assessment & Plan notes found for this encounter.   Cough and congestion Patient with 12 days of cough, runny nose, and congestion that is mildly improving after using Flonase, Sudafed, Tessalon Perles.  He has remained afebrile.  On physical exam lungs were without any focal findings.  Discussed that patient is on a good medication regimen, and that symptoms can linger from a viral infection for a while.  Given the length of his symptoms, did offer a chest x-ray, but patient opted to wait it out a little longer.  Gave strict return precautions, and advised that if he does not improve to give [place] a call and we can order the chest x-ray.  [place]

## 2020-09-30 DIAGNOSIS — R059 Cough, unspecified: Secondary | ICD-10-CM | POA: Insufficient documentation

## 2020-10-02 ENCOUNTER — Other Ambulatory Visit: Payer: Self-pay

## 2020-10-02 ENCOUNTER — Ambulatory Visit
Admission: RE | Admit: 2020-10-02 | Discharge: 2020-10-02 | Disposition: A | Discharge status: Home / Self Care | Payer: BLUE CROSS/BLUE SHIELD | Source: Ambulatory Visit | Attending: Family Medicine | Admitting: Family Medicine

## 2020-10-02 DIAGNOSIS — R059 Cough, unspecified: Secondary | ICD-10-CM | POA: Diagnosis not present

## 2020-10-06 ENCOUNTER — Encounter: Payer: Self-pay | Admitting: Family Medicine

## 2020-10-14 ENCOUNTER — Encounter: Payer: Self-pay | Admitting: Family Medicine

## 2020-10-24 ENCOUNTER — Other Ambulatory Visit: Payer: Self-pay | Admitting: Family Medicine

## 2020-12-02 DIAGNOSIS — L821 Other seborrheic keratosis: Secondary | ICD-10-CM | POA: Diagnosis not present

## 2020-12-02 DIAGNOSIS — L820 Inflamed seborrheic keratosis: Secondary | ICD-10-CM | POA: Diagnosis not present

## 2020-12-02 DIAGNOSIS — Z85828 Personal history of other malignant neoplasm of skin: Secondary | ICD-10-CM | POA: Diagnosis not present

## 2020-12-02 DIAGNOSIS — L814 Other melanin hyperpigmentation: Secondary | ICD-10-CM | POA: Diagnosis not present

## 2020-12-02 DIAGNOSIS — D225 Melanocytic nevi of trunk: Secondary | ICD-10-CM | POA: Diagnosis not present

## 2020-12-14 ENCOUNTER — Ambulatory Visit (INDEPENDENT_AMBULATORY_CARE_PROVIDER_SITE_OTHER): Payer: BLUE CROSS/BLUE SHIELD | Admitting: Family Medicine

## 2020-12-14 ENCOUNTER — Other Ambulatory Visit: Payer: Self-pay

## 2020-12-14 ENCOUNTER — Ambulatory Visit: Payer: Self-pay

## 2020-12-14 ENCOUNTER — Encounter: Payer: Self-pay | Admitting: Family Medicine

## 2020-12-14 VITALS — BP 140/88 | HR 88 | Ht 71.0 in | Wt 189.6 lb

## 2020-12-14 DIAGNOSIS — M79604 Pain in right leg: Secondary | ICD-10-CM | POA: Diagnosis not present

## 2020-12-14 DIAGNOSIS — S7011XA Contusion of right thigh, initial encounter: Secondary | ICD-10-CM | POA: Diagnosis not present

## 2020-12-14 NOTE — Patient Instructions (Signed)
Referral to [place] physical therapy sent they will call you to schedule if you do not hear from them in a month   Body helix full thigh compression  I recommend you obtained a compression sleeve to help with your joint problems. There are many options on the market however I recommend obtaining a full thigh Body Helix compression sleeve.  You can find information (including how to appropriate measure yourself for sizing) can be found at www.Body http://www.lambert.com/.  Many of these products are health savings account (HSA) eligible.   You can use the compression sleeve at any time throughout the day but is most important to use while being active as well as for 2 hours post-activity.   It is appropriate to ice following activity with the compression sleeve in place.   Follow up in 1 month

## 2020-12-14 NOTE — Progress Notes (Signed)
   I, [name], LAT, ATC, am serving as scribe for Dr. Lynne Leader.  Harry Nash is a 65 y.o. male who presents to [place] at [facility] today for R leg pain. Pt was previously seen by Dr. Tamala Julian on 04/03/20 for chronic L shoulder pain. Today, pt c/o R thigh pain since last night when he hit his thigh against his bed post. Pt locates pain to his R lateral thigh.   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   Exam:  BP 140/88 (BP Location: Right Arm, Patient Position: Sitting, Cuff Size: Normal)   Pulse 88   Ht 5\' 11"  (1.803 m)   Wt 189 lb 9.6 oz (86 kg)   SpO2 95%   BMI 26.44 kg/m  General: Well Developed, well nourished, and in no acute distress.   MSK: Right thigh slightly swollen otherwise normal-appearing Tender palpation anterior proximal thigh. Pain with resisted knee extension and hip flexion. Antalgic gait    Lab and Radiology Results  Diagnostic Limited MSK Ultrasound of: Right quad Muscles intact. Deep anterior quad small area of hypoechoic structure consistent with hematoma measuring 2 x 3 x 3 cm Impression: Deep thigh hematoma      Assessment and Plan: 65 y.o. male with right thigh hematoma.  Plan for compression and PT.  Also recommend home exercise program.  Patient is an excellent PT candidate.  Plan to refer to [facility] rehab as he has been there previously. Additionally will recommend compression sleeve. Recheck in 1 month.   PDMP not reviewed this encounter. Orders Placed This Encounter  Procedures   [place] LIMITED JOINT SPACE STRUCTURES LOW RIGHT(NO LINKED CHARGES)    Standing Status:   Future    Number of Occurrences:   1    Standing  Expiration Date:   12/14/2021    Order Specific Question:   Reason for Exam (SYMPTOM  OR DIAGNOSIS REQUIRED)    Answer:   right lleg pain    Order Specific Question:   Preferred imaging location?    Answer:   [place]   Ambulatory referral to Physical Therapy    Referral Priority:   Routine    Referral Type:   Physical Medicine    Referral Reason:   Specialty Services Required    Referred to Provider:   Rehabilitation, [place]    Requested Specialty:   Physical Therapy    Number of Visits Requested:   1   No orders of the defined types were placed in this encounter.    Discussed warning signs or symptoms. Please see discharge instructions. Patient expresses understanding.   The above documentation has been reviewed and is accurate and complete Lynne Leader, M.D.

## 2020-12-18 DIAGNOSIS — M79604 Pain in right leg: Secondary | ICD-10-CM | POA: Diagnosis not present

## 2020-12-18 DIAGNOSIS — R269 Unspecified abnormalities of gait and mobility: Secondary | ICD-10-CM | POA: Diagnosis not present

## 2020-12-18 DIAGNOSIS — S7011XA Contusion of right thigh, initial encounter: Secondary | ICD-10-CM | POA: Diagnosis not present

## 2020-12-29 DIAGNOSIS — S7011XA Contusion of right thigh, initial encounter: Secondary | ICD-10-CM | POA: Diagnosis not present

## 2020-12-29 DIAGNOSIS — M79604 Pain in right leg: Secondary | ICD-10-CM | POA: Diagnosis not present

## 2020-12-29 DIAGNOSIS — R269 Unspecified abnormalities of gait and mobility: Secondary | ICD-10-CM | POA: Diagnosis not present

## 2020-12-31 DIAGNOSIS — R269 Unspecified abnormalities of gait and mobility: Secondary | ICD-10-CM | POA: Diagnosis not present

## 2020-12-31 DIAGNOSIS — S7011XA Contusion of right thigh, initial encounter: Secondary | ICD-10-CM | POA: Diagnosis not present

## 2020-12-31 DIAGNOSIS — M79604 Pain in right leg: Secondary | ICD-10-CM | POA: Diagnosis not present

## 2021-01-04 DIAGNOSIS — M79604 Pain in right leg: Secondary | ICD-10-CM | POA: Diagnosis not present

## 2021-01-04 DIAGNOSIS — R269 Unspecified abnormalities of gait and mobility: Secondary | ICD-10-CM | POA: Diagnosis not present

## 2021-01-04 DIAGNOSIS — S7011XA Contusion of right thigh, initial encounter: Secondary | ICD-10-CM | POA: Diagnosis not present

## 2021-01-06 DIAGNOSIS — M79604 Pain in right leg: Secondary | ICD-10-CM | POA: Diagnosis not present

## 2021-01-06 DIAGNOSIS — R269 Unspecified abnormalities of gait and mobility: Secondary | ICD-10-CM | POA: Diagnosis not present

## 2021-01-06 DIAGNOSIS — S7011XA Contusion of right thigh, initial encounter: Secondary | ICD-10-CM | POA: Diagnosis not present

## 2021-01-12 DIAGNOSIS — R269 Unspecified abnormalities of gait and mobility: Secondary | ICD-10-CM | POA: Diagnosis not present

## 2021-01-12 DIAGNOSIS — M79604 Pain in right leg: Secondary | ICD-10-CM | POA: Diagnosis not present

## 2021-01-12 DIAGNOSIS — S7011XA Contusion of right thigh, initial encounter: Secondary | ICD-10-CM | POA: Diagnosis not present

## 2021-01-15 ENCOUNTER — Other Ambulatory Visit: Payer: Self-pay

## 2021-01-15 ENCOUNTER — Encounter: Payer: Self-pay | Admitting: Plastic Surgery

## 2021-01-15 ENCOUNTER — Ambulatory Visit (INDEPENDENT_AMBULATORY_CARE_PROVIDER_SITE_OTHER): Payer: BLUE CROSS/BLUE SHIELD | Admitting: Plastic Surgery

## 2021-01-15 VITALS — BP 132/82 | HR 92 | Ht 71.0 in | Wt 188.8 lb

## 2021-01-15 DIAGNOSIS — D489 Neoplasm of uncertain behavior, unspecified: Secondary | ICD-10-CM | POA: Diagnosis not present

## 2021-01-15 NOTE — Progress Notes (Signed)
° °  Referring Provider [place], Jennefer Bravo, MD [address], [address] [place],  [place] [id number]   CC:  Skin lesions x 2 that are bothersome.   Harry Nash is an 65 y.o. male.  HPI: Patient has a left forehead and a left lesion that are bothersome to him and would like excision.  Allergies  Allergen Reactions   Gabapentin Other (See Comments)    Clouded mentation    Outpatient Encounter Medications as of 01/15/2021  Medication Sig   clopidogrel (PLAVIX) 75 MG tablet TAKE 1 TABLET(75 MG) BY MOUTH DAILY   LORazepam (ATIVAN) 0.5 MG tablet TAKE ONE TABLET TWICE A DAY AS NEEDED FOR ANXIETy (Patient taking differently: Take 0.5 mg by mouth as needed for anxiety. TAKE ONE TABLET TWICE A DAY AS NEEDED FOR ANXIETy)   rosuvastatin (CRESTOR) 40 MG tablet Take 1 tablet (40 mg total) by mouth daily.   sildenafil (VIAGRA) 100 MG tablet TAKE 1/2 TO 1 TABLET BY MOUTH DAILY AS NEEDED FOR ERECTILE DYSFUNCTION. (Patient taking differently: Take 50 mg by mouth as needed for erectile dysfunction.)   No facility-administered encounter medications on file as of 01/15/2021.     Past Medical History:  Diagnosis Date   Depression    Headache(784.0)    Kidney stones    TIA (transient ischemic attack)     Past Surgical History:  Procedure Laterality Date   HERNIA REPAIR     ROTATOR CUFF REPAIR      Family History  Problem Relation Age of Onset   Heart disease Mother    Heart disease Father    Hypertension Father     Social History   Social History Narrative   Not on file     Review of Systems General: Denies fevers, chills, weight loss CV: Denies chest pain, shortness of breath, palpitations   Physical Exam Vitals with BMI 01/15/2021 12/14/2020 09/23/2020  Height 5\' 11"  5\' 11"  -  Weight 188 lbs 13 oz 189 lbs 10 oz -  BMI 60.45 40.98 -  Systolic 119 147 829  Diastolic 82 88 80  Pulse 92 88 90  Some encounter information is confidential and restricted. Go to Review  Flowsheets activity to see all data.    General:  No acute distress,  Alert and oriented, Non-Toxic, Normal speech and affect HEENT: 1 cm lesion left forehead, cystic lesion Skin: 1 cm left back lesion to the left of midline, cystic  Assessment/Plan Lesions are most likely sebaceous cyst.  Excision in office is indicated.  [name] 01/15/2021, 10:55 AM

## 2021-01-19 DIAGNOSIS — M79604 Pain in right leg: Secondary | ICD-10-CM | POA: Diagnosis not present

## 2021-01-19 DIAGNOSIS — S7011XA Contusion of right thigh, initial encounter: Secondary | ICD-10-CM | POA: Diagnosis not present

## 2021-01-19 DIAGNOSIS — R269 Unspecified abnormalities of gait and mobility: Secondary | ICD-10-CM | POA: Diagnosis not present

## 2021-01-27 ENCOUNTER — Other Ambulatory Visit: Payer: Self-pay

## 2021-01-27 ENCOUNTER — Ambulatory Visit (INDEPENDENT_AMBULATORY_CARE_PROVIDER_SITE_OTHER): Payer: BLUE CROSS/BLUE SHIELD | Admitting: Plastic Surgery

## 2021-01-27 ENCOUNTER — Other Ambulatory Visit (HOSPITAL_COMMUNITY)
Admission: RE | Admit: 2021-01-27 | Discharge: 2021-01-27 | Disposition: A | Discharge status: Home / Self Care | Payer: BLUE CROSS/BLUE SHIELD | Source: Ambulatory Visit | Attending: Plastic Surgery | Admitting: Plastic Surgery

## 2021-01-27 VITALS — BP 163/78 | HR 85 | Ht 71.0 in | Wt 190.2 lb

## 2021-01-27 DIAGNOSIS — D489 Neoplasm of uncertain behavior, unspecified: Secondary | ICD-10-CM | POA: Diagnosis not present

## 2021-01-28 NOTE — Progress Notes (Signed)
Operative Note   DATE OF OPERATION: 01/28/2021  LOCATION:    SURGICAL DEPARTMENT: Plastic Surgery  PREOPERATIVE DIAGNOSES:  left forehead and central back neoplasms  POSTOPERATIVE DIAGNOSES:  same  PROCEDURE:  Excision of left forehead lesion 1.7 cm 1.7 cm intermediate closure Excision central back 1.7 cm 1.7 cm intermediate clossure  SURGEON: Lori Popowski P. Zyion Leidner, MD  ANESTHESIA:  Local  COMPLICATIONS: None.   INDICATIONS FOR PROCEDURE:  The patient, Harry Nash is a 66 y.o. male born on [birth year], is here for treatment of skin neoplasms. MRN: [id number]  CONSENT:  Informed consent was obtained directly from the patient. Risks, benefits and alternatives were fully discussed. Specific risks including but not limited to bleeding, infection, hematoma, seroma, scarring, pain, infection, wound healing problems, and need for further surgery were all discussed. The patient did have an ample opportunity to have questions answered to satisfaction.   DESCRIPTION OF PROCEDURE:  Local anesthesia was administered. The patient's operative site was prepped and draped in a sterile fashion. A time out was performed and all information was confirmed to be correct.  The lesion was excised with a 15 blade.  Hemostasis was obtained.  The patient was draped separately for each leesion.  In each case circumferential undermining was performed and the skin was advanced and closed in layers with interrupted buried PDS for deep sutures and Prolene for the skin.   The patient tolerated the procedure well.  There were no complications.

## 2021-01-29 ENCOUNTER — Telehealth: Payer: Self-pay

## 2021-01-29 LAB — SURGICAL PATHOLOGY

## 2021-01-29 NOTE — Telephone Encounter (Signed)
Dr. Erin Hearing spoke with patient, and answered questions with concerns.

## 2021-01-29 NOTE — Telephone Encounter (Signed)
Patient's wife, Eda Paschal, called regarding patient.  Patient also joined the phone call.  Patient said he had a cyst removed from his forehead and now he is experiencing swelling above his eyebrow and his eyes are black & blue.  Patient would like to know if this is normal.  Patient's wife said they did not get information on what to expect post-procedure and she would like to know if this is normal.  Please call patient at [phone].

## 2021-02-05 ENCOUNTER — Other Ambulatory Visit: Payer: Self-pay

## 2021-02-05 ENCOUNTER — Ambulatory Visit: Payer: 59 | Admitting: Plastic Surgery

## 2021-02-05 ENCOUNTER — Ambulatory Visit: Payer: BLUE CROSS/BLUE SHIELD | Admitting: Plastic Surgery

## 2021-02-05 DIAGNOSIS — D2339 Other benign neoplasm of skin of other parts of face: Secondary | ICD-10-CM

## 2021-02-05 NOTE — Progress Notes (Signed)
Patient is status post left forehead excision and back excision.  Presents for suture removal.  Physical exam Incisions clean dry and intact  FINAL MICROSCOPIC DIAGNOSIS:   A. CYST, LEFT FOREHEAD, EXCISION:  - Epidermal inclusion cyst.  - Negative for malignancy.   B. CYST, CENTRAL BACK, EXCISION:  - Epidermal inclusion cyst.  - Negative for malignancy.   Assessment and plan Benign pathology and the patient is healing well.  Sutures were removed and the patient may follow-up as needed.

## 2021-02-12 ENCOUNTER — Ambulatory Visit: Payer: BLUE CROSS/BLUE SHIELD | Admitting: Plastic Surgery

## 2021-02-18 ENCOUNTER — Other Ambulatory Visit: Payer: Self-pay

## 2021-02-18 DIAGNOSIS — E7800 Pure hypercholesterolemia, unspecified: Secondary | ICD-10-CM

## 2021-02-18 MED ORDER — ROSUVASTATIN CALCIUM 40 MG PO TABS: 40.0000 mg | ORAL_TABLET | Freq: Every day | ORAL | 1 refills | Status: DC

## 2021-02-18 MED ORDER — CLOPIDOGREL BISULFATE 75 MG PO TABS: ORAL_TABLET | ORAL | 1 refills | Status: DC

## 2021-02-18 NOTE — Telephone Encounter (Signed)
Patient calls nurse line regarding change in insurance. New insurance covers prescriptions at [facility].   Per patient request, I called and canceled his refills for Plavix and rosuvastatin at [facility]. Sent over remaining [facility] to [facility] on [address].   FYI to PCP.   Talbot Grumbling, RN

## 2021-02-22 ENCOUNTER — Encounter: Payer: Self-pay | Admitting: Family Medicine

## 2021-02-22 ENCOUNTER — Ambulatory Visit (INDEPENDENT_AMBULATORY_CARE_PROVIDER_SITE_OTHER): Payer: 59 | Admitting: Family Medicine

## 2021-02-22 ENCOUNTER — Other Ambulatory Visit: Payer: Self-pay

## 2021-02-22 DIAGNOSIS — Z8249 Family history of ischemic heart disease and other diseases of the circulatory system: Secondary | ICD-10-CM

## 2021-02-22 DIAGNOSIS — E7800 Pure hypercholesterolemia, unspecified: Secondary | ICD-10-CM | POA: Diagnosis not present

## 2021-02-22 DIAGNOSIS — F4323 Adjustment disorder with mixed anxiety and depressed mood: Secondary | ICD-10-CM

## 2021-02-22 DIAGNOSIS — R351 Nocturia: Secondary | ICD-10-CM | POA: Insufficient documentation

## 2021-02-22 DIAGNOSIS — M791 Myalgia, unspecified site: Secondary | ICD-10-CM | POA: Insufficient documentation

## 2021-02-22 MED ORDER — LORAZEPAM 0.5 MG PO TABS: ORAL_TABLET | ORAL | 1 refills | Status: DC

## 2021-02-22 NOTE — Patient Instructions (Signed)
I will call with lab test results I suggest you cut back on the salt, sodium.  That may help the frequent urination at night.   I sent in a prescription for the ativan.

## 2021-02-23 ENCOUNTER — Encounter: Payer: Self-pay | Admitting: Family Medicine

## 2021-02-23 NOTE — Assessment & Plan Note (Signed)
Goal LDL < 70 Stay on statin because to decrease his high ASCVD risk.

## 2021-02-23 NOTE — Assessment & Plan Note (Signed)
Likely exertion related. Check for statin induced myopathy.  Checked CK which showed low grade elevation.  Fortunately, he has multiple CKs in the past.  I believe his normal is in the 300-400 range.  I do not think he has statin induced myopathy.  Continue statin for secondary prevention.

## 2021-02-23 NOTE — Progress Notes (Signed)
° ° °  SUBJECTIVE:   CHIEF COMPLAINT / HPI:   This is more multiple complaints than an annual physical.  Issues: Bruises and bleeds easily - questions need for continued plavix.  Clarified hx.  He was already on ASA 81 mg when he had his TIA.  That was the rationale for long term plavix use. Similarly, he has multiple joint and muscle aches.  Asks about statin.  He is secondary prevention with goal LDL less than 70.  Last LDL was not at goal and I increased his crestor dose.  He has not had an LDL since the dose increase.  For secondary prevention, it is also important to know that his FHx is strongly positive for CAD. Also talks about fatigue and weakness.  When exploring questions around depression, he got tearful saying he was worried he would not live to see his 46 and 67 year old children enter adulthood.  We clearly established that ASCVD risk reduction was his top priority because of his kids.  No other concerns on depression screen. Minor problem  nocturia x 2-3.  No daytime pedal swelling.  No hx of heart, renal or hepatic disease.  Does not have outflow obstructive sx.      OBJECTIVE:   BP 129/84    Pulse 78    Ht 5\' 11"  (1.803 m)    Wt 190 lb 9.6 oz (86.5 kg)    SpO2 97%    BMI 26.58 kg/m   Lungs clear Cardiac RRR without m or g Ext unimpressive muscle soreness.   Psych, cognition and mood normal except for the brief moment he worried about his kids.    ASSESSMENT/PLAN:   Nocturia Doubt major concern.  Patient wants urology referral to further discuss prostate treatments.    Myalgia Likely exertion related. Check for statin induced myopathy.  Checked CK which showed low grade elevation.  Fortunately, he has multiple CKs in the past.  I believe his normal is in the 300-400 range.  I do not think he has statin induced myopathy.  Continue statin for secondary prevention.    HYPERCHOLESTEROLEMIA Goal LDL < 70 Stay on statin because to decrease his high ASCVD risk.    Family  history of heart disease Stay on statin despite muscle aches and plaxix despite bruising because of high ASCVD risk/secondary prevention.     [name], MD [place]

## 2021-02-23 NOTE — Assessment & Plan Note (Signed)
Doubt major concern.  Patient wants urology referral to further discuss prostate treatments.

## 2021-02-23 NOTE — Assessment & Plan Note (Signed)
Stay on statin despite muscle aches and plaxix despite bruising because of high ASCVD risk/secondary prevention.

## 2021-02-24 ENCOUNTER — Telehealth: Payer: Self-pay | Admitting: Family Medicine

## 2021-02-24 NOTE — Telephone Encounter (Signed)
Called and discussed low grade CK elevation.  Either he lives there (normal for him) or low grade elevation from rosuvastatin.  Either way, stay on current dose.  Still awaiting direct LDL.

## 2021-02-25 LAB — CK: Total CK: 410 U/L — ABNORMAL HIGH (ref 41–331)

## 2021-02-25 LAB — LDL CHOLESTEROL, DIRECT: LDL Direct: 74 mg/dL (ref 0–99)

## 2021-02-25 NOTE — Addendum Note (Signed)
Addended by: [name] on: 02/25/2021 08:47 AM   Modules accepted: Orders

## 2021-03-17 ENCOUNTER — Other Ambulatory Visit: Payer: Self-pay

## 2021-03-17 ENCOUNTER — Ambulatory Visit (HOSPITAL_BASED_OUTPATIENT_CLINIC_OR_DEPARTMENT_OTHER): Payer: 59 | Admitting: Cardiology

## 2021-03-17 ENCOUNTER — Encounter (HOSPITAL_BASED_OUTPATIENT_CLINIC_OR_DEPARTMENT_OTHER): Payer: Self-pay | Admitting: Cardiology

## 2021-03-17 VITALS — BP 136/84 | HR 89 | Ht 71.0 in | Wt 190.0 lb

## 2021-03-17 DIAGNOSIS — E7800 Pure hypercholesterolemia, unspecified: Secondary | ICD-10-CM

## 2021-03-17 DIAGNOSIS — Z7189 Other specified counseling: Secondary | ICD-10-CM

## 2021-03-17 DIAGNOSIS — Z8673 Personal history of transient ischemic attack (TIA), and cerebral infarction without residual deficits: Secondary | ICD-10-CM

## 2021-03-17 NOTE — Progress Notes (Signed)
Cardiology Office Note:    Date:  03/17/2021   ID:  Harry Nash, DOB [birth year], MRN [id number]  PCP:  [name], MD  Cardiologist:  Buford Dresser, MD  Referring MD: [name], MD   CC: follow-up  History of Present Illness:    Harry Nash is a 66 y.o. male with hx of TIA who is seen today for follow-up. He was initially seen 02/25/19 as a new consult at the request of Hensel, Jamal Collin, MD for the evaluation and management of cardiovascular risk.  Today:  Since his last appointment, he had a TIA episode 05/07/19 lasting about 30 to 40 minutes. He started feeling dizzy for 15 seconds. He started walking but he lost sensation in his L leg. He tried to walk off the feeling. After some time, the numbness and dizziness resolved. He contacted Dr. Andria Frames and was advised to go to the emergency room.  He was taken off of aspirin and started on Plavix. He was already taking Crestor. Since the episode, he is doing well and does not have lingering symptoms.  He is compliant in taking his medications. Small cuts and bruises heal slowly because of his Plavix.   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 He does have a gym membership but has not gone yet. He used to be a Interior and spatial designer for 30 years.   Of note, he endorses tinnitus.   Denies chest pain, shortness of breath at rest or with normal exertion. No PND, orthopnea, LE edema or unexpected weight gain. No syncope or palpitations.  Past Medical History:  Diagnosis Date   Depression    Headache(784.0)    Kidney stones    TIA (transient ischemic attack)     Past Surgical History:  Procedure Laterality Date   HERNIA REPAIR     ROTATOR CUFF REPAIR      Current Medications: Current Outpatient Medications on File Prior to Visit  Medication Sig   clopidogrel (PLAVIX) 75 MG tablet TAKE 1 TABLET(75 MG) BY MOUTH DAILY   LORazepam (ATIVAN) 0.5 MG tablet TAKE  ONE TABLET TWICE A DAY AS NEEDED FOR ANXIETy   rosuvastatin (CRESTOR) 40 MG tablet Take 1 tablet (40 mg total) by mouth daily.   sildenafil (VIAGRA) 100 MG tablet TAKE 1/2 TO 1 TABLET BY MOUTH DAILY AS NEEDED FOR ERECTILE DYSFUNCTION. (Patient taking differently: Take 50 mg by mouth as needed for erectile dysfunction.)   No current facility-administered medications on file prior to visit.     Allergies:   Gabapentin   Social History   Tobacco Use   Smoking status: Former    Types: Cigarettes    Quit date: 02/01/2003    Years since quitting: 18.1   Smokeless tobacco: Never  Vaping Use   Vaping Use: Never used  Substance Use Topics   Alcohol use: Yes    Alcohol/week: 1.0 standard drink    Types: 1 Cans of beer per week    Comment: moderate   Drug use: No    Family History: family history includes Heart disease in his father and mother; Hypertension in his father.  ROS:   Please see the history of present illness.  Additional pertinent ROS: (+) Tinnitus (+) TIA episode  EKGs/Labs/Other Studies Reviewed:    The following studies were reviewed today: Echo 05/08/19 1. Left ventricular ejection fraction, by estimation, is 60 to 65%. The  left ventricle has normal function.  The left ventricle has no regional  wall motion abnormalities. There is mild left ventricular hypertrophy.  Left ventricular diastolic parameters  are consistent with Grade I diastolic dysfunction (impaired relaxation).   2. Right ventricular systolic function is normal. The right ventricular  size is normal. There is normal pulmonary artery systolic pressure.   3. Bubble study negative for significant atrial shunt, however, unable to  exclude small late intrahepatic/intrapulmonary shunt.   4. The mitral valve is grossly normal. Trivial mitral valve  regurgitation.   5. The aortic valve is tricuspid. Aortic valve regurgitation is trivial.  Mild aortic valve sclerosis is present, with no evidence of aortic  valve  stenosis.   6. The inferior vena cava is normal in size with greater than 50%  respiratory variability, suggesting right atrial pressure of 3 mmHg.   Brain MRI 05/07/19 FINDINGS: Brain: No restricted diffusion to suggest acute infarction. No midline shift, mass effect, evidence of mass lesion, ventriculomegaly, extra-axial collection or acute intracranial hemorrhage. Cervicomedullary junction and pituitary are within normal limits.   Pearline Cables and white matter signal is normal for age throughout the brain. No encephalomalacia or chronic cerebral blood products identified.   Vascular: Major intracranial vascular flow voids are preserved.   Skull and upper cervical spine: Negative visible cervical spine and bone marrow signal.   Sinuses/Orbits: Negative orbits. Trace paranasal sinus mucosal thickening.   Other: Trace fluid in the left mastoid air cells. Negative nasopharynx. Visible internal auditory structures appear normal. Stylomastoid foramina appear normal. Visible face and scalp soft tissues appear negative.   IMPRESSION: Normal noncontrast MRI appearance of the brain   CTA Neck 05/07/19 IMPRESSION: 1. Negative for large vessel occlusion. No significant arterial stenosis in the head or neck. 2. Mild to moderate for age cervical carotid atherosclerosis. Minimal intracranial atherosclerosis. 3. Multilevel cervical spine degeneration and mild spinal stenosis.  Echo 2013 - Left ventricle: The cavity size was normal. Systolic    function was normal. The estimated ejection fraction was    in the range of 60% to 65%. Wall motion was normal; there    were no regional wall motion abnormalities. Left    ventricular diastolic function parameters were normal.  - Aortic valve: Trivial regurgitation.   EKG:  EKG is personally reviewed.   03/17/21: NSR at 89 bpm 02/25/19: NSR  Recent Labs: 07/20/2020: ALT 25; BUN 14; Creatinine, Ser 0.95; Hemoglobin 14.9; Platelets 218; Potassium  4.4; Sodium 139  Recent Lipid Panel    Component Value Date/Time   CHOL 159 07/20/2020 1006   TRIG 86 07/20/2020 1006   HDL 62 07/20/2020 1006   CHOLHDL 2.6 07/20/2020 1006   CHOLHDL 3.4 07/23/2015 1037   VLDL 23 07/23/2015 1037   LDLCALC 81 07/20/2020 1006   LDLDIRECT 74 02/22/2021 1617   LDLDIRECT 117 (H) 02/12/2010 2025      Physical Exam:    VS:  BP 136/84    Pulse 89    Ht 5\' 11"  (1.803 m)    Wt 190 lb (86.2 kg)    SpO2 95%    BMI 26.50 kg/m     Wt Readings from Last 3 Encounters:  03/17/21 190 lb (86.2 kg)  02/22/21 190 lb 9.6 oz (86.5 kg)  01/27/21 190 lb 3.2 oz (86.3 kg)    GEN: Well nourished, well developed in no acute distress HEENT: Normal, moist mucous membranes NECK: No JVD CARDIAC: regular rhythm, normal S1 and S2, no rubs or gallops. No murmurs. VASCULAR: Radial and DP pulses  2+ bilaterally. No carotid bruits RESPIRATORY:  Clear to auscultation without rales, wheezing or rhonchi  ABDOMEN: Soft, non-tender, non-distended MUSCULOSKELETAL:  Ambulates independently SKIN: Warm and dry, no edema NEUROLOGIC:  Alert and oriented x 3. No focal neuro deficits noted. PSYCHIATRIC:  Normal affect    ASSESSMENT:    1. History of TIA (transient ischemic attack)   2. Pure hypercholesterolemia   3. Cardiac risk counseling   4. Counseling on health promotion and disease prevention     PLAN:    Hypercholesterolemia History of TIA -LDL goal <70, checked 02/22/21 and was 74 -has generalized muscle aches, but CK within his normal range, not suggestive of myositis -continue rosuvastatin -continue clopidogrel as TIA occurred while on aspirin   Family history of aortic/valve disease: -echo noted tricuspid aortic valve   Cardiac risk counseling and prevention recommendations: -recommend heart healthy/Mediterranean diet, with whole grains, fruits, vegetable, fish, lean meats, nuts, and olive oil. Limit salt. He has an excellent diet -recommend moderate walking, 3-5  times/week for 30-50 minutes each session. Aim for at least 150 minutes.week. Goal should be pace of 3 miles/hours, or walking 1.5 miles in 30 minutes. He had an excellent exercise lifestyle in the past, recently decreased but plans to return to good habits when recovered from surgery. -recommend avoidance of tobacco products. Avoid excess alcohol.  Plan for follow up: 2 years or sooner as needed  Buford Dresser, MD, PhD, [facility]    Medication Adjustments/Labs and Tests Ordered: Current medicines are reviewed at length with the patient today.  Concerns regarding medicines are outlined above.  Orders Placed This Encounter  Procedures   EKG 12-Lead   No orders of the defined types were placed in this encounter.   Patient Instructions  Medication Instructions:  Your Physician recommend you continue on your current medication as directed.    *If you need a refill on your cardiac medications before your next appointment, please call your pharmacy*   Lab Work: None ordered today   Testing/Procedures: None ordered today   Follow-Up: At [facility], you and your health needs are our priority.  As part of our continuing mission to provide you with exceptional heart care, we have created designated Provider Care Teams.  These Care Teams include your primary Cardiologist (physician) and Advanced Practice Providers (APPs -  Physician Assistants and Nurse Practitioners) who all work together to provide you with the care you need, when you need it.  We recommend signing up for the patient portal called "MyChart".  Sign up information is provided on this After Visit Summary.  MyChart is used to connect with patients for Virtual Visits (Telemedicine).  Patients are able to view lab/test results, encounter notes, upcoming appointments, etc.  Non-urgent messages can be sent to your provider as well.   To learn more about what you can do with MyChart, go to  [email or web address].    Your next appointment:   2 year(s)  The format for your next appointment:   In Person  Provider:   Buford Dresser, MD        Wilhemina Bonito as a scribe for Buford Dresser, MD.,have documented all relevant documentation on the behalf of Buford Dresser, MD,as directed by  Buford Dresser, MD while in the presence of Buford Dresser, MD.  I, Buford Dresser, MD, have reviewed all documentation for this visit. The documentation on 03/17/21 for the exam, diagnosis, procedures, and orders are all accurate and complete.   Signed, Zannah Melucci  Harrell Gave, MD PhD 03/17/2021  [place]

## 2021-03-17 NOTE — Patient Instructions (Signed)

## 2021-04-04 IMAGING — MR MR HEAD W/O CM
8 of 10 series · 36 of 48 positions shown · non-contrast
Comparison: Plain head CT, CTA head and neck earlier today.

CLINICAL DATA: 63-year-old male with sudden onset dizziness and
left leg numbness at 2233 hours today, now resolved.

EXAM:
MRI HEAD WITHOUT CONTRAST
TECHNIQUE: Multiplanar, multiecho pulse sequences of the brain and surrounding
structures were obtained without intravenous contrast.

[Series 4: DWI · axial · 3.0mm · 1.09mm/px · z∈[-46,+103]mm · 9 of 104 slices shown (1 of 4)]
[im 1/104]
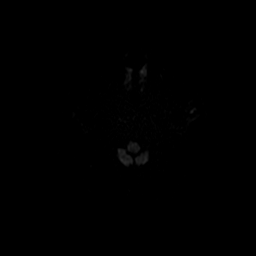
[im 13/104]
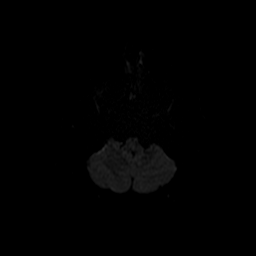
[im 26/104]
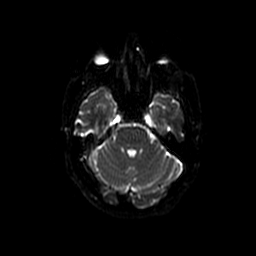
[im 39/104]
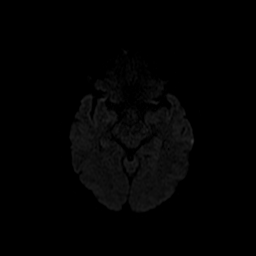
[im 52/104]
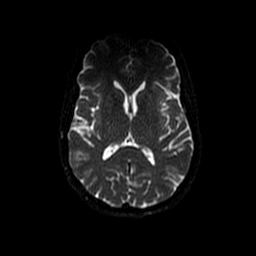
[im 65/104]
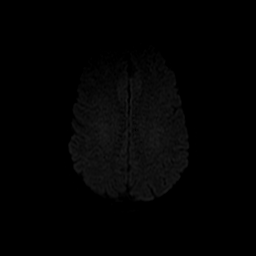
[im 78/104]
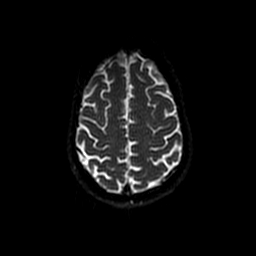
[im 91/104]
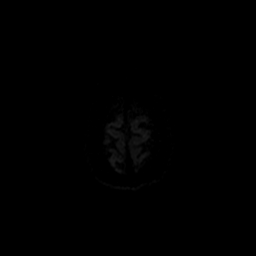
[im 104/104]
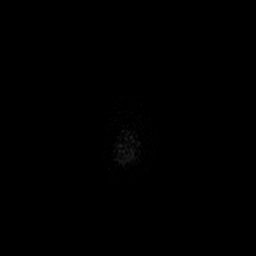

[Series 5: DWI · coronal · 5.0mm · 1.09mm/px · 7 of 78 slices shown (2 of 4)]
[im 1/78]
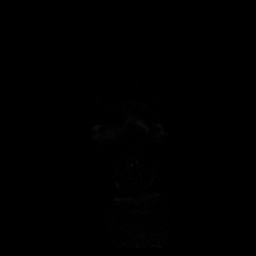
[im 13/78]
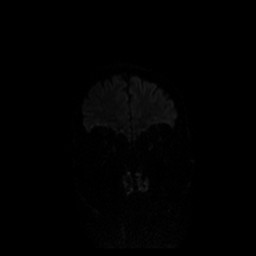
[im 26/78]
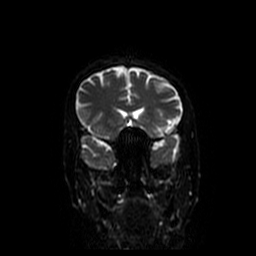
[im 39/78]
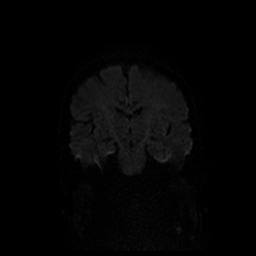
[im 52/78]
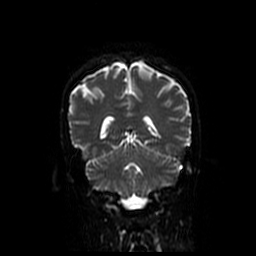
[im 65/78]
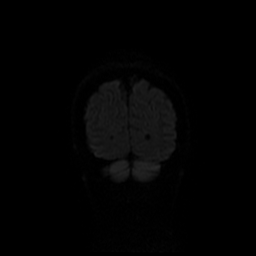
[im 78/78]
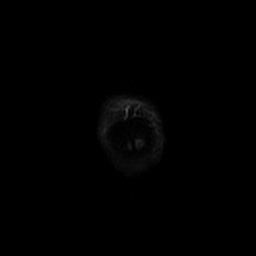

[Series 6: T1 · sagittal · 5.0mm · 0.47mm/px · 2 of 26 slices shown]
[im 1/26]
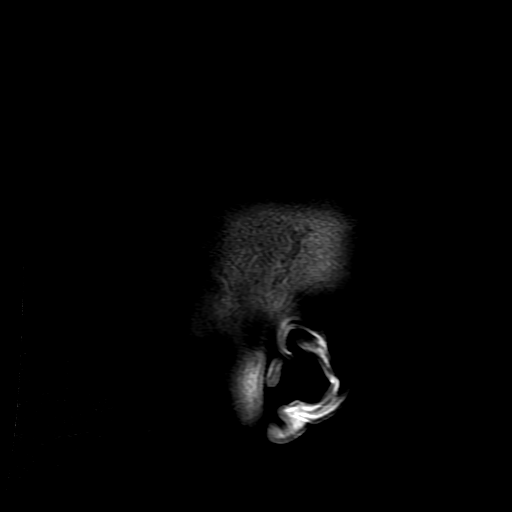
[im 26/26]
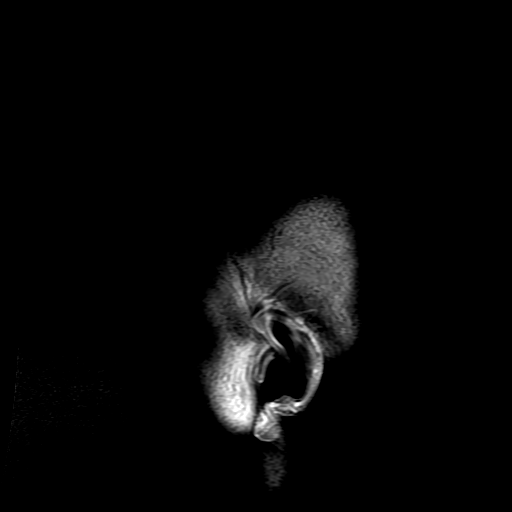

[Series 7: T2 · axial · 5.0mm · 0.45mm/px · z∈[-54,+102]mm · 3 of 28 slices shown (1 of 2)]
[im 1/28]
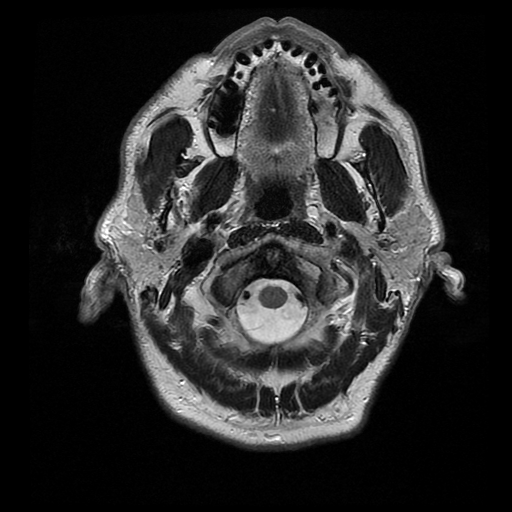
[im 14/28]
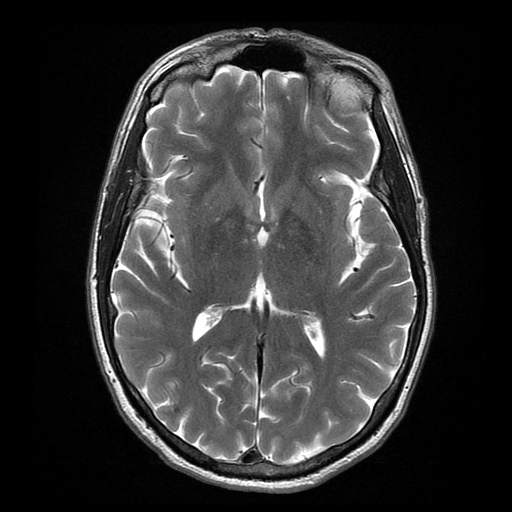
[im 28/28]
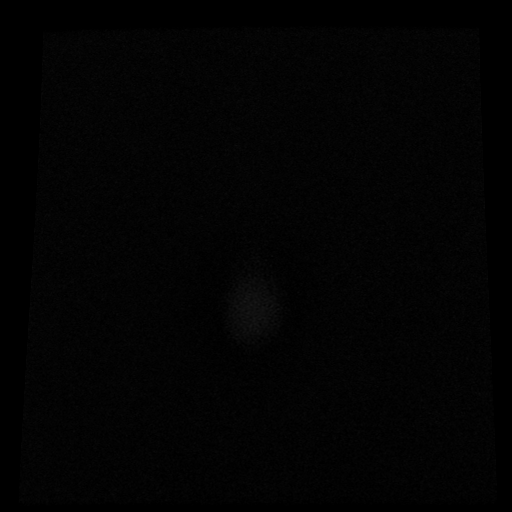

[Series 8: FLAIR · axial · 3.0mm · 0.45mm/px · z∈[-53,+105]mm · 3 of 28 slices shown]
[im 1/28]
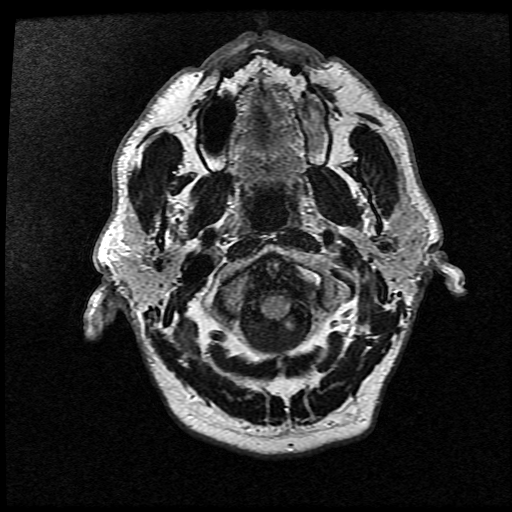
[im 14/28]
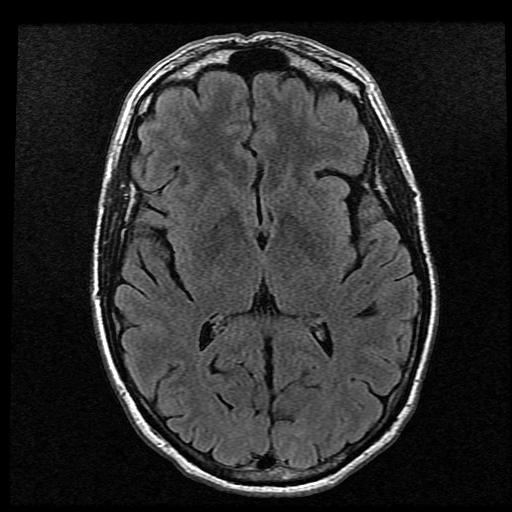
[im 28/28]
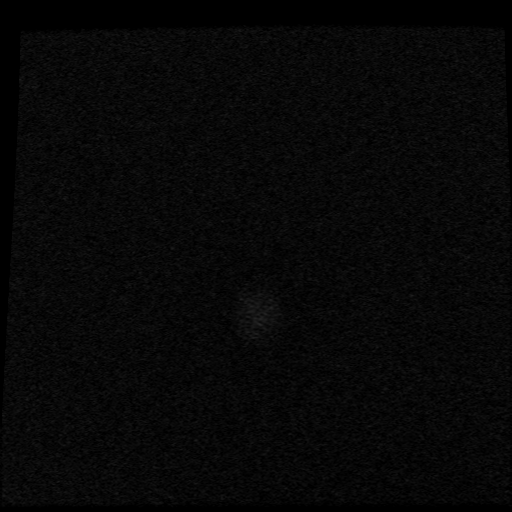

[Series 11: T2 · coronal · 5.0mm · 0.39mm/px · 3 of 31 slices shown (2 of 2)]
[im 1/31]
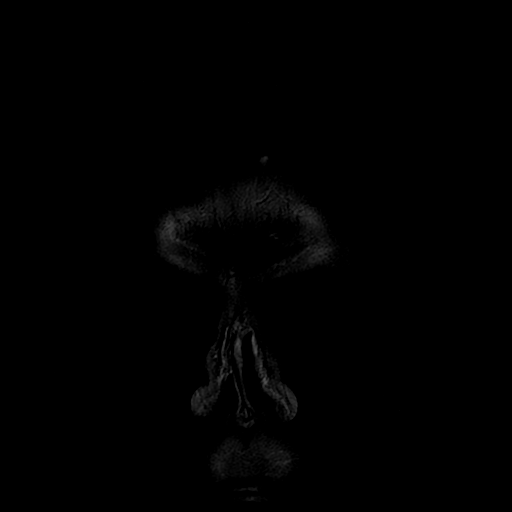
[im 16/31]
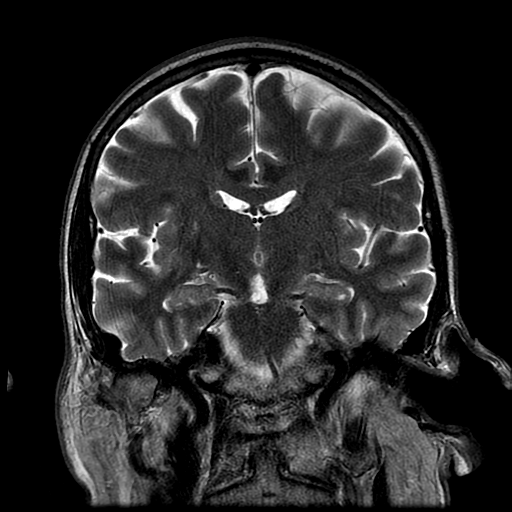
[im 31/31]
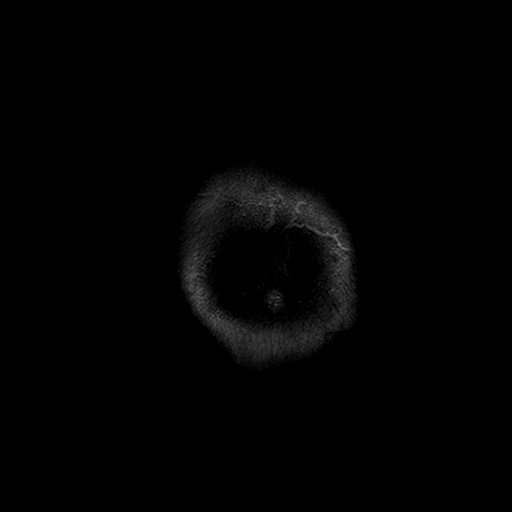

[Series 400: DWI · axial · 3.0mm · 1.09mm/px · z∈[-46,+103]mm · 5 of 52 slices shown (3 of 4)]
[im 1/52]
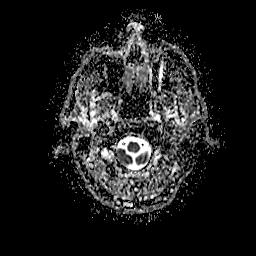
[im 13/52]
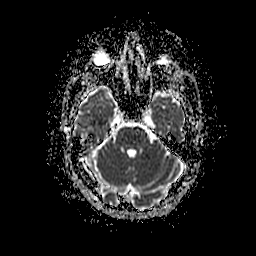
[im 26/52]
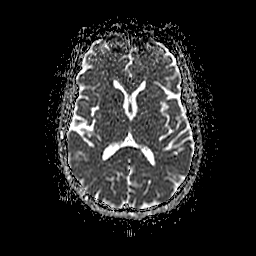
[im 39/52]
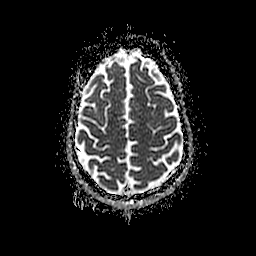
[im 52/52]
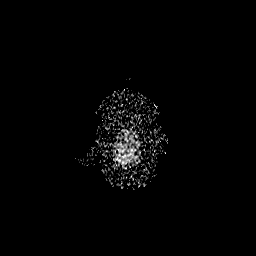

[Series 500: DWI · coronal · 5.0mm · 1.09mm/px · 4 of 39 slices shown (4 of 4)]
[im 1/39]
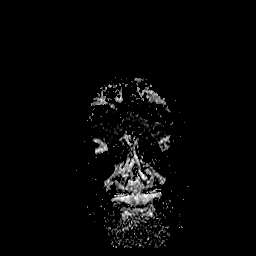
[im 13/39]
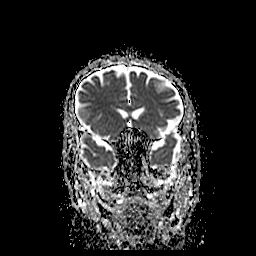
[im 26/39]
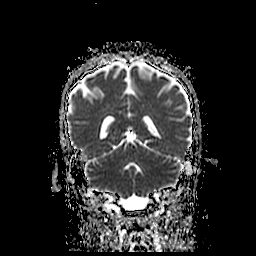
[im 39/39]
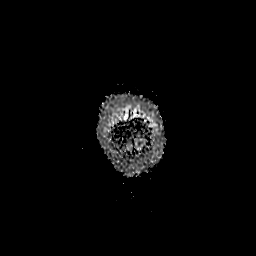

[36 of 48 positions shown; findings below may reference images not displayed]

FINDINGS: Brain: No restricted diffusion to suggest acute infarction. No
midline shift, mass effect, evidence of mass lesion,
ventriculomegaly, extra-axial collection or acute intracranial
hemorrhage. Cervicomedullary junction and pituitary are within
normal limits.

Gray and white matter signal is normal for age throughout the brain.
No encephalomalacia or chronic cerebral blood products identified.

Vascular: Major intracranial vascular flow voids are preserved.

Skull and upper cervical spine: Negative visible cervical spine and
bone marrow signal.

Sinuses/Orbits: Negative orbits. Trace paranasal sinus mucosal
thickening.

Other: Trace fluid in the left mastoid air cells. Negative
nasopharynx. Visible internal auditory structures appear normal.
Stylomastoid foramina appear normal. Visible face and scalp soft
tissues appear negative.
IMPRESSION: Normal noncontrast MRI appearance of the brain.

## 2021-04-06 ENCOUNTER — Encounter: Payer: Self-pay | Admitting: Family Medicine

## 2021-04-27 ENCOUNTER — Other Ambulatory Visit: Payer: Self-pay

## 2021-04-27 ENCOUNTER — Ambulatory Visit (INDEPENDENT_AMBULATORY_CARE_PROVIDER_SITE_OTHER): Payer: 59

## 2021-04-27 ENCOUNTER — Ambulatory Visit (INDEPENDENT_AMBULATORY_CARE_PROVIDER_SITE_OTHER): Payer: 59 | Admitting: Family Medicine

## 2021-04-27 VITALS — BP 130/80 | HR 85 | Ht 71.0 in | Wt 189.0 lb

## 2021-04-27 DIAGNOSIS — M545 Low back pain, unspecified: Secondary | ICD-10-CM

## 2021-04-27 DIAGNOSIS — M62830 Muscle spasm of back: Secondary | ICD-10-CM | POA: Diagnosis not present

## 2021-04-27 MED ORDER — PREDNISONE 20 MG PO TABS: 20.0000 mg | ORAL_TABLET | Freq: Every day | ORAL | 0 refills | Status: DC

## 2021-04-27 MED ORDER — METHYLPREDNISOLONE ACETATE 80 MG/ML IJ SUSP
80.0000 mg | Freq: Once | INTRAMUSCULAR | Status: AC
  Administered 2021-04-27: 80 mg via INTRAMUSCULAR

## 2021-04-27 MED ORDER — KETOROLAC TROMETHAMINE 60 MG/2ML IM SOLN
60.0000 mg | Freq: Once | INTRAMUSCULAR | Status: AC
  Administered 2021-04-27: 30 mg via INTRAMUSCULAR

## 2021-04-27 NOTE — Progress Notes (Signed)
?Charlann Boxer D.O. ?[facility] ?[place] ?Phone: [phone] ?Subjective:   ? ?I'm seeing this patient by the request  of:  Hensel, Jamal Collin, MD ? ?CC: Low back pain ? ?[id number]  ?Harry Nash is a 66 y.o. male coming in with complaint of L rib pain. Last seen in March 2022 for L shoulder pain. Patient states  he picked up a 40 lb weight on Friday and he felt something in that  moment thought it would pass but he had 3 painful nights of sleep,no numbness/tingling, no radiating pain, has been taking ib, advil, aleve, and 2 muscle relaxer's yesterday and nothing has helped, has a real hard time finding a comfy  way to sleep.  ?Patient denies any significant dysuria, hematuria, any bowel or bladder changes recently.  Feels sometimes though that he is having some mild stomach discomfort. ? ?  ? ?Past Medical History:  ?Diagnosis Date  ? Depression   ? Headache(784.0)   ? Kidney stones   ? TIA (transient ischemic attack)   ? ?Past Surgical History:  ?Procedure Laterality Date  ? HERNIA REPAIR    ? ROTATOR CUFF REPAIR    ? ?Social History  ? ?Socioeconomic History  ? Marital status: Married  ?  Spouse name: Not on file  ? Number of children: 3  ? Years of education: Not on file  ? Highest education level: Not on file  ?Occupational History  ? Not on file  ?Tobacco Use  ? Smoking status: Former  ?  Types: Cigarettes  ?  Quit date: 02/01/2003  ?  Years since quitting: 18.2  ? Smokeless tobacco: Never  ?Vaping Use  ? Vaping Use: Never used  ?Substance and Sexual Activity  ? Alcohol use: Yes  ?  Alcohol/week: 1.0 standard drink  ?  Types: 1 Cans of beer per week  ?  Comment: moderate  ? Drug use: No  ? Sexual activity: Yes  ?Other Topics Concern  ? Not on file  ?Social History Narrative  ? Not on file  ? ?Social Determinants of Health  ? ?Financial Resource Strain: Not on file  ?Food Insecurity: Not on file  ?Transportation Needs: Not on file  ?Physical Activity: Not on  file  ?Stress: Not on file  ?Social Connections: Not on file  ? ?Allergies  ?Allergen Reactions  ? Gabapentin Other (See Comments)  ?  Clouded mentation  ? ?Family History  ?Problem Relation Age of Onset  ? Heart disease Mother   ? Heart disease Father   ? Hypertension Father   ? ? ?Current Outpatient Medications (Endocrine & Metabolic):  ?  predniSONE (DELTASONE) 20 MG tablet, Take 1 tablet (20 mg total) by mouth daily with breakfast. ? ?Current Outpatient Medications (Cardiovascular):  ?  rosuvastatin (CRESTOR) 40 MG tablet, Take 1 tablet (40 mg total) by mouth daily. ?  sildenafil (VIAGRA) 100 MG tablet, TAKE 1/2 TO 1 TABLET BY MOUTH DAILY AS NEEDED FOR ERECTILE DYSFUNCTION. (Patient taking differently: Take 50 mg by mouth as needed for erectile dysfunction.) ? ? ? ?Current Outpatient Medications (Hematological):  ?  clopidogrel (PLAVIX) 75 MG tablet, TAKE 1 TABLET(75 MG) BY MOUTH DAILY ? ?Current Outpatient Medications (Other):  ?  LORazepam (ATIVAN) 0.5 MG tablet, TAKE ONE TABLET TWICE A DAY AS NEEDED FOR ANXIETy ? ? ?Reviewed prior external information including notes and imaging from  ?primary care provider reviewed most recent labs in January and continues to have elevation in  the CK level.  LDL recently has come down to 74 but goal secondary to CVA is 70. ?As well as notes that were available from care everywhere and other healthcare systems. ? ?Past medical history, social, surgical and family history all reviewed in electronic medical record.  No pertanent information unless stated regarding to the chief complaint.  ? ?Review of Systems: ? No headache, visual changes, nausea, vomiting, diarrhea, constipation, dizziness, abdominal pain, skin rash, fevers, chills, night sweats, weight loss, swollen lymph nodes, joint swelling, chest pain, shortness of breath, mood changes. POSITIVE muscle aches, body aches ? ?Objective  ?Blood pressure 130/80, pulse 85, height '5\' 11"'$  (1.803 m), weight 189 lb (85.7 kg), SpO2  96 %. ?  ?General: No apparent distress alert and oriented x3 mood and affect normal, dressed appropriately.  ?HEENT: Pupils equal, extraocular movements intact  ?Respiratory: Patient's speak in full sentences and does not appear short of breath  ?Cardiovascular: No lower extremity edema, non tender, no erythema  ?Gait normal with good balance and coordination.  ?MSK: Low back exam does have significant tightness noted.  Patient is tender to palpation in the paraspinal musculature of the lumbar spine going around and radiating to the lateral side.  No significant masses appreciated but does have an overlying lipoma in the area.  Patient does have increased tightness noted of the left hip with FABER test compared to the contralateral side.  Worsening pain with rotation of the back.  Abdominal exam is tender but no significant rebound or guarding noted. ? ?  ?Impression and Recommendations:  ?  ? ?The above documentation has been reviewed and is accurate and complete Lyndal Pulley, DO ? ? ? ?

## 2021-04-27 NOTE — Patient Instructions (Addendum)
Cocktail today ?Prednisone 20 mg daily  ?No anti inflammatories ?Lumbar xray on the way out  ?Worsening pain seek medical treatment immediatly  ?1-2 week follow up  ?

## 2021-04-27 NOTE — Assessment & Plan Note (Addendum)
Patient does have what appears to be moderate.  Muscular spasm noted.  Patient was given a Toradol and Depo-Medrol today.  Patient is on Plavix and encouraged him to avoid any other anti-inflammatories.  We will give a low-dose of prednisone though for 20 mg starting tomorrow.  Discussed with patient about the possibility of this being more of a statin related and we did discuss laboratory work-up.  Patient would like to hold at this point.  We will consider it though.  Patient also has a past medical history significant for kidney stones and if worsening symptoms or any type of dysuria or hematuria to seek medical attention immediately.  If going to get different x-rays of the back.  Follow-up with me again in 1 to 2 weeks.  Once again patient knows if worsening pain to seek medical attention immediately, and would highly recommend laboratory work-up to rule out other viscerosomatic causes as well. ?

## 2021-05-03 ENCOUNTER — Encounter: Payer: Self-pay | Admitting: Family Medicine

## 2021-05-05 ENCOUNTER — Ambulatory Visit: Payer: 59 | Admitting: Family Medicine

## 2021-05-28 ENCOUNTER — Encounter: Payer: Self-pay | Admitting: Family Medicine

## 2021-07-06 ENCOUNTER — Encounter: Payer: Self-pay | Admitting: *Deleted

## 2021-08-12 ENCOUNTER — Ambulatory Visit: Payer: 59 | Admitting: Family Medicine

## 2021-08-12 ENCOUNTER — Ambulatory Visit (INDEPENDENT_AMBULATORY_CARE_PROVIDER_SITE_OTHER): Payer: 59

## 2021-08-12 VITALS — BP 126/82 | HR 92 | Ht 71.0 in | Wt 190.0 lb

## 2021-08-12 DIAGNOSIS — M545 Low back pain, unspecified: Secondary | ICD-10-CM

## 2021-08-12 DIAGNOSIS — M2550 Pain in unspecified joint: Secondary | ICD-10-CM | POA: Diagnosis not present

## 2021-08-12 LAB — COMPREHENSIVE METABOLIC PANEL
ALT: 27 U/L (ref 0–53)
AST: 23 U/L (ref 0–37)
Albumin: 4.4 g/dL (ref 3.5–5.2)
Alkaline Phosphatase: 61 U/L (ref 39–117)
BUN: 21 mg/dL (ref 6–23)
CO2: 29 mEq/L (ref 19–32)
Calcium: 9.6 mg/dL (ref 8.4–10.5)
Chloride: 103 mEq/L (ref 96–112)
Creatinine, Ser: 0.93 mg/dL (ref 0.40–1.50)
GFR: 85.86 mL/min (ref >60.00)
Glucose, Bld: 95 mg/dL (ref 70–99)
Potassium: 4.1 mEq/L (ref 3.5–5.1)
Sodium: 136 mEq/L (ref 135–145)
Total Bilirubin: 0.7 mg/dL (ref 0.2–1.2)
Total Protein: 7.2 g/dL (ref 6.0–8.3)

## 2021-08-12 LAB — SEDIMENTATION RATE: Sed Rate: 8 mm/hr (ref 0–20)

## 2021-08-12 LAB — URIC ACID: Uric Acid, Serum: 5.5 mg/dL (ref 4.0–7.8)

## 2021-08-12 LAB — VITAMIN B12: Vitamin B-12: 323 pg/mL (ref 211–911)

## 2021-08-12 LAB — VITAMIN D 25 HYDROXY (VIT D DEFICIENCY, FRACTURES): VITD: 41.94 ng/mL (ref 30.00–100.00)

## 2021-08-12 LAB — CBC WITH DIFFERENTIAL/PLATELET
Basophils Absolute: 0 10*3/uL (ref 0.0–0.1)
Basophils Relative: 0.6 % (ref 0.0–3.0)
Eosinophils Absolute: 0.2 10*3/uL (ref 0.0–0.7)
Eosinophils Relative: 2.5 % (ref 0.0–5.0)
HCT: 46.8 % (ref 39.0–52.0)
Hemoglobin: 15.5 g/dL (ref 13.0–17.0)
Lymphocytes Relative: 24.8 % (ref 12.0–46.0)
Lymphs Abs: 1.6 10*3/uL (ref 0.7–4.0)
MCHC: 33 g/dL (ref 30.0–36.0)
MCV: 87.2 fl (ref 78.0–100.0)
Monocytes Absolute: 0.7 10*3/uL (ref 0.1–1.0)
Monocytes Relative: 10.6 % (ref 3.0–12.0)
Neutro Abs: 3.9 10*3/uL (ref 1.4–7.7)
Neutrophils Relative %: 61.5 % (ref 43.0–77.0)
Platelets: 210 10*3/uL (ref 150.0–400.0)
RBC: 5.37 Mil/uL (ref 4.22–5.81)
RDW: 12.8 % (ref 11.5–15.5)
WBC: 6.4 10*3/uL (ref 4.0–10.5)

## 2021-08-12 LAB — IBC PANEL
Iron: 107 ug/dL (ref 42–165)
Saturation Ratios: 28.1 % (ref 20.0–50.0)
TIBC: 380.8 ug/dL (ref 250.0–450.0)
Transferrin: 272 mg/dL (ref 212.0–360.0)

## 2021-08-12 LAB — CK: Total CK: 241 U/L — ABNORMAL HIGH (ref 7–232)

## 2021-08-12 LAB — FERRITIN: Ferritin: 83.4 ng/mL (ref 22.0–322.0)

## 2021-08-12 LAB — C-REACTIVE PROTEIN: CRP: <1 mg/dL (ref 0.5–20.0)

## 2021-08-12 LAB — TESTOSTERONE: Testosterone: 319.21 ng/dL (ref 300.00–890.00)

## 2021-08-12 LAB — TSH: TSH: 1.36 u[IU]/mL (ref 0.35–5.50)

## 2021-08-12 NOTE — Patient Instructions (Addendum)
Labs today PT referral CoQ10 '400mg'$  daily with statin daily See you again in 6-8 weeks

## 2021-08-12 NOTE — Progress Notes (Signed)
Zach Gr[facility] [place] [address] [place] Phone: [phone] Subjective:   [name], am serving as a scribe for [name].  I'm seeing this patient by the request  of:  Hensel, Jamal Collin, MD  CC: Back pain  [id number]  Keymon Mcelroy Friebel is a 66 y.o. male coming in with complaint of LBP on the right side. Worrisome more over the past 8-10 days. Thought it was muscle spasms. Pain is worse at night Not taking any medications. Pain described as spasm type pain.  Patient states it seems to be worse at night but can happen during the day.  Denies any radiation down the leg.  Denies any rest, chills, any abnormal weight loss.   Previous x-rays of patient's back showed the patient mild to moderate degenerative changes of the lumbar spine March 2023.  When comparing to 2017 only slight progression.    Past Medical History:  Diagnosis Date   Depression    Headache(784.0)    Kidney stones    TIA (transient ischemic attack)    Past Surgical History:  Procedure Laterality Date   HERNIA REPAIR     ROTATOR CUFF REPAIR     Social History   Socioeconomic History   Marital status: Married    Spouse name: Not on file   Number of children: 3   Years of education: Not on file   Highest education level: Not on file  Occupational History   Not on file  Tobacco Use   Smoking status: Former    Types: Cigarettes    Quit date: 02/01/2003    Years since quitting: 18.5   Smokeless tobacco: Never  Vaping Use   Vaping Use: Never used  Substance and Sexual Activity   Alcohol use: Yes    Alcohol/week: 1.0 standard drink of alcohol    Types: 1 Cans of beer per week    Comment: moderate   Drug use: No   Sexual activity: Yes  Other Topics Concern   Not on file  Social History Narrative   Not on file   Social Determinants of Health   Financial Resource Strain: Not on file  Food Insecurity: Not on file  Transportation Needs: Not on  file  Physical Activity: Not on file  Stress: Not on file  Social Connections: Not on file   Allergies  Allergen Reactions   Gabapentin Other (See Comments)    Clouded mentation   Family History  Problem Relation Age of Onset   Heart disease Mother    Heart disease Father    Hypertension Father     Current Outpatient Medications (Endocrine & Metabolic):    predniSONE (DELTASONE) 20 MG tablet, Take 1 tablet (20 mg total) by mouth daily with breakfast.  Current Outpatient Medications (Cardiovascular):    rosuvastatin (CRESTOR) 40 MG tablet, Take 1 tablet (40 mg total) by mouth daily.   sildenafil (VIAGRA) 100 MG tablet, TAKE 1/2 TO 1 TABLET BY MOUTH DAILY AS NEEDED FOR ERECTILE DYSFUNCTION. (Patient taking differently: Take 50 mg by mouth as needed for erectile dysfunction.)    Current Outpatient Medications (Hematological):    clopidogrel (PLAVIX) 75 MG tablet, TAKE 1 TABLET(75 MG) BY MOUTH DAILY  Current Outpatient Medications (Other):    LORazepam (ATIVAN) 0.5 MG tablet, TAKE ONE TABLET TWICE A DAY AS NEEDED FOR ANXIETy   Reviewed prior external information including notes and imaging from  primary care provider As well as notes that were available  from care everywhere and other healthcare systems.  Past medical history, social, surgical and family history all reviewed in electronic medical record.  No pertanent information unless stated regarding to the chief complaint.   Review of Systems:  No headache, visual changes, nausea, vomiting, diarrhea, constipation, dizziness, abdominal pain, skin rash, fevers, chills, night sweats, weight loss, swollen lymph nodes, body aches, joint swelling, chest pain, shortness of breath, mood changes. POSITIVE muscle aches  Objective  Blood pressure 126/82, pulse 92, height '5\' 11"'$  (1.803 m), weight 190 lb (86.2 kg), SpO2 96 %.   General: No apparent distress alert and oriented x3 mood and affect normal, dressed appropriately.  HEENT:  Pupils equal, extraocular movements intact  Respiratory: Patient's speak in full sentences and does not appear short of breath  Cardiovascular: No lower extremity edema, non tender, no erythema  Loss of lordosis noted.  Patient does have some tenderness noted. Low back does have some loss of lordosis.  Tightness noted with FABER test.  Decrease in internal range of motion of the hips bilaterally.    Impression and Recommendations:     The above documentation has been reviewed and is accurate and complete Lyndal Pulley, DO

## 2021-08-12 NOTE — Assessment & Plan Note (Signed)
Chronic problem with exacerbation the patient is now having more discomfort that seems to be more spasms.  The patient is having more the moment.  Denies any fevers chills or any abnormal weight loss.  Would like to get repeat x-rays to further evaluate for any type of progression of some arthritic changes that could be contributing.  Discussed with patient about icing regimen and home exercises.  Due to the severity of the pain and the acute aspect of it as well as affecting his potential for employment we will get laboratory work-up to further evaluate.  Back pain

## 2021-08-15 LAB — PTH, INTACT AND CALCIUM
Calcium: 9.6 mg/dL (ref 8.6–10.3)
PTH: 13 pg/mL — ABNORMAL LOW (ref 16–77)

## 2021-08-15 LAB — ANTI-NUCLEAR AB-TITER (ANA TITER): ANA Titer 1: 1:40 {titer} — ABNORMAL HIGH

## 2021-08-15 LAB — CALCIUM, IONIZED: Calcium, Ion: 5.1 mg/dL (ref 4.7–5.5)

## 2021-08-15 LAB — ANA: Anti Nuclear Antibody (ANA): POSITIVE — AB

## 2021-08-15 LAB — CYCLIC CITRUL PEPTIDE ANTIBODY, IGG: Cyclic Citrullin Peptide Ab: <16 UNITS

## 2021-08-15 LAB — RHEUMATOID FACTOR: Rheumatoid fact SerPl-aCnc: <14 IU/mL (ref <14)

## 2021-08-15 LAB — EXTRA LAV TOP TUBE

## 2021-08-15 LAB — ANGIOTENSIN CONVERTING ENZYME: Angiotensin-Converting Enzyme: 49 U/L (ref 9–67)

## 2021-08-16 ENCOUNTER — Encounter: Payer: Self-pay | Admitting: Family Medicine

## 2021-08-24 ENCOUNTER — Encounter: Payer: Self-pay | Admitting: Family Medicine

## 2021-08-25 ENCOUNTER — Other Ambulatory Visit: Payer: Self-pay

## 2021-08-25 DIAGNOSIS — M2550 Pain in unspecified joint: Secondary | ICD-10-CM

## 2021-08-27 ENCOUNTER — Other Ambulatory Visit (INDEPENDENT_AMBULATORY_CARE_PROVIDER_SITE_OTHER): Payer: 59

## 2021-08-27 DIAGNOSIS — M2550 Pain in unspecified joint: Secondary | ICD-10-CM

## 2021-08-27 LAB — LIPID PANEL
Cholesterol: 164 mg/dL (ref 0–200)
HDL: 59.6 mg/dL (ref >39.00)
LDL Cholesterol: 91 mg/dL (ref 0–99)
NonHDL: 104.21
Total CHOL/HDL Ratio: 3
Triglycerides: 67 mg/dL (ref 0.0–149.0)
VLDL: 13.4 mg/dL (ref 0.0–40.0)

## 2021-08-30 ENCOUNTER — Ambulatory Visit: Payer: 59 | Admitting: Family Medicine

## 2021-08-30 DIAGNOSIS — D511 Vitamin B12 deficiency anemia due to selective vitamin B12 malabsorption with proteinuria: Secondary | ICD-10-CM

## 2021-08-30 DIAGNOSIS — M62830 Muscle spasm of back: Secondary | ICD-10-CM | POA: Diagnosis not present

## 2021-08-30 MED ORDER — CYANOCOBALAMIN 1000 MCG/ML IJ SOLN
1000.0000 ug | Freq: Once | INTRAMUSCULAR | Status: AC
  Administered 2021-08-30: 1000 ug via INTRAMUSCULAR

## 2021-08-30 NOTE — Assessment & Plan Note (Signed)
Patient does have tenderness to palpation again.  And is concerned for some intermittent radicular symptoms.  Discussed with patient about icing regimen and home exercises.  Discussed which activities to do and which ones to avoid.  Discussed with patient if this continues to occur I do feel that MRI is necessary.  Patient is fairly confident that this is possibly secondary to his statin.  Will ask patient to discuss with his primary care about the possibility of [place].  LDL was at 91 on last check.

## 2021-08-30 NOTE — Assessment & Plan Note (Signed)
Injection given today.  Likely more secondary to vitamin D deficiency.  Could be malabsorption.  We will see how patient responds to the injection today.

## 2021-08-30 NOTE — Addendum Note (Signed)
Addended by: Judy Pimple R on: 08/30/2021 02:03 PM   Modules accepted: Orders

## 2021-08-30 NOTE — Progress Notes (Signed)
[name] Phone: [phone] Subjective:    I'm seeing this patient by the request  of:  [name], MD  CC: Low back pain  [id number]  08/12/2021 Chronic problem with exacerbation the patient is now having more discomfort that seems to be more spasms.  The patient is having more the moment.  Denies any fevers chills or any abnormal weight loss.  Would like to get repeat x-rays to further evaluate for any type of progression of some arthritic changes that could be contributing.  Discussed with patient about icing regimen and home exercises.  Due to the severity of the pain and the acute aspect of it as well as affecting his potential for employment we will get laboratory work-up to further evaluate.  Back pain  Update 08/30/2021 Arther Heisler Nash is a 66 y.o. male coming in with complaint of back pain with no radiating symptoms. Patient states that he had a few days where he was unable to get up from lying down position. Pain was like electricity to the lower right side of his lumbar spine. Pain has improved over past few days. Stopped taking statin on Thursday of last week. Notes tightness of the R hip.      Past Medical History:  Diagnosis Date   Depression    Headache(784.0)    Kidney stones    TIA (transient ischemic attack)    Past Surgical History:  Procedure Laterality Date   HERNIA REPAIR     ROTATOR CUFF REPAIR     Social History   Socioeconomic History   Marital status: Married    Spouse name: Not on file   Number of children: 3   Years of education: Not on file   Highest education level: Not on file  Occupational History   Not on file  Tobacco Use   Smoking status: Former    Types: Cigarettes    Quit date: 02/01/2003    Years since quitting: 18.5   Smokeless tobacco: Never  Vaping Use   Vaping Use: Never used  Substance and Sexual Activity   Alcohol use: Yes    Alcohol/week:  1.0 standard drink of alcohol    Types: 1 Cans of beer per week    Comment: moderate   Drug use: No   Sexual activity: Yes  Other Topics Concern   Not on file  Social History Narrative   Not on file   Social Determinants of Health   Financial Resource Strain: Not on file  Food Insecurity: Not on file  Transportation Needs: Not on file  Physical Activity: Not on file  Stress: Not on file  Social Connections: Not on file   Allergies  Allergen Reactions   Gabapentin Other (See Comments)    Clouded mentation   Family History  Problem Relation Age of Onset   Heart disease Mother    Heart disease Father    Hypertension Father     Current Outpatient Medications (Endocrine & Metabolic):    predniSONE (DELTASONE) 20 MG tablet, Take 1 tablet (20 mg total) by mouth daily with breakfast.  Current Outpatient Medications (Cardiovascular):    rosuvastatin (CRESTOR) 40 MG tablet, Take 1 tablet (40 mg total) by mouth daily.   sildenafil (VIAGRA) 100 MG tablet, TAKE 1/2 TO 1 TABLET BY MOUTH DAILY AS NEEDED FOR ERECTILE DYSFUNCTION. (Patient taking differently: Take 50 mg by mouth as needed for erectile dysfunction.)    Current Outpatient  Medications (Hematological):    clopidogrel (PLAVIX) 75 MG tablet, TAKE 1 TABLET(75 MG) BY MOUTH DAILY  Current Outpatient Medications (Other):    LORazepam (ATIVAN) 0.5 MG tablet, TAKE ONE TABLET TWICE A DAY AS NEEDED FOR ANXIETy   Reviewed prior external information including notes and imaging from  primary care provider As well as notes that were available from care everywhere and other healthcare systems.  Past medical history, social, surgical and family history all reviewed in electronic medical record.  No pertanent information unless stated regarding to the chief complaint.   Review of Systems:  No headache, visual changes, nausea, vomiting, diarrhea, constipation, dizziness, abdominal pain, skin rash, fevers, chills, night sweats, weight  loss, swollen lymph nodes, body aches, joint swelling, chest pain, shortness of breath, mood changes. POSITIVE muscle aches  Objective  Blood pressure 124/72, pulse 80, height '5\' 11"'$  (1.803 m), weight 192 lb (87.1 kg), SpO2 97 %.   General: No apparent distress alert and oriented x3 mood and affect normal, dressed appropriately.  HEENT: Pupils equal, extraocular movements intact  Respiratory: Patient's speak in full sentences and does not appear short of breath  Cardiovascular: No lower extremity edema, non tender, no erythema  Low back exam does have some loss of lordosis.  Some tenderness to palpation in the paraspinal musculature on the right side.  Negative straight leg test but does have tightness with straight leg test.    Impression and Recommendations:

## 2021-08-30 NOTE — Patient Instructions (Addendum)
B12 injection today Ok to stay off statin Ask primary about Repatha Continue Plavix Get CoQ10 If worsening pain will get MRI See you at your next visit

## 2021-09-22 ENCOUNTER — Ambulatory Visit: Payer: 59 | Admitting: Family Medicine

## 2021-09-27 ENCOUNTER — Encounter: Payer: Self-pay | Admitting: Family Medicine

## 2021-09-27 ENCOUNTER — Ambulatory Visit: Payer: 59 | Admitting: Family Medicine

## 2021-09-27 DIAGNOSIS — E7800 Pure hypercholesterolemia, unspecified: Secondary | ICD-10-CM

## 2021-09-27 DIAGNOSIS — F4323 Adjustment disorder with mixed anxiety and depressed mood: Secondary | ICD-10-CM

## 2021-09-27 DIAGNOSIS — N529 Male erectile dysfunction, unspecified: Secondary | ICD-10-CM

## 2021-09-27 DIAGNOSIS — M791 Myalgia, unspecified site: Secondary | ICD-10-CM | POA: Diagnosis not present

## 2021-09-27 DIAGNOSIS — Z23 Encounter for immunization: Secondary | ICD-10-CM

## 2021-09-27 MED ORDER — TADALAFIL 10 MG PO TABS: 10.0000 mg | ORAL_TABLET | Freq: Every day | ORAL | 3 refills | Status: DC | PRN

## 2021-09-27 MED ORDER — LORAZEPAM 0.5 MG PO TABS: ORAL_TABLET | ORAL | 0 refills | Status: DC

## 2021-09-27 NOTE — Patient Instructions (Signed)
You look great I do think you need to be on something with your family history and your TIA.  I referred you to the lipid clinic.  They can talk about Rapatha for you.   I sent in two prescriptions.  I hope the new medicine for you.

## 2021-09-28 ENCOUNTER — Encounter: Payer: Self-pay | Admitting: Family Medicine

## 2021-09-28 DIAGNOSIS — Z23 Encounter for immunization: Secondary | ICD-10-CM | POA: Insufficient documentation

## 2021-09-28 NOTE — Assessment & Plan Note (Signed)
Refer to lipid clinic.  Because of old TIA, goal LDL<70

## 2021-09-28 NOTE — Assessment & Plan Note (Signed)
Rarely uses lorazepam, but likes to have in case he needs.  Did not fill last printed RX and brought in for me to destroy.  Given another Rx for a smaller amount.

## 2021-09-28 NOTE — Assessment & Plan Note (Signed)
Reluctant to try another statin.  Will refer to lipid clinic for possible Rapatha.

## 2021-09-28 NOTE — Assessment & Plan Note (Signed)
Switch from viagra to cialis per patient request.

## 2021-09-28 NOTE — Progress Notes (Signed)
    SUBJECTIVE:   CHIEF COMPLAINT / HPI:   FU multiple problems: Our initial BP was high.  Recheck by me fine.  No hx of hypertension.  He does not meet criteria for that diagnosis. History of TIA.  As such, he is secondary prvention for ASCVD risk.  Had lipid panel 08/12/21 which had LDL above goal.  He had stopped his rosuvastatin a week or two earlier due to myalgias.  The myalgias have cleared and he feels the best he has felt in years.  He is reluctant to go back on another statin.  He does remain on Plavix. ED.  Would like to try cialis rather than his current viagra.  Tolerating viagra well.  His family life with two children makes it difficult to plan two hours in advance.  As such, "the weekend pill" seems more promising to him. HPDP, may have had colonoscopy through [facility] recently but we do not have records.  He will check.  Discussed need for shingles vaccine and pneumonia vaccine.      OBJECTIVE:   BP 125/72 (BP Location: Left Arm, Cuff Size: Normal)   Pulse 71   Ht '5\' 11"'$  (1.803 m)   Wt 191 lb 6.4 oz (86.8 kg)   SpO2 98%   BMI 26.69 kg/m   VS noted.  BP good on recheck. Lungs clear Cardiac RRR without m or g Ext no edema.  ASSESSMENT/PLAN:   HYPERCHOLESTEROLEMIA Reluctant to try another statin.  Will refer to lipid clinic for possible Rapatha.    Myalgia due to statin Refer to lipid clinic.  Because of old TIA, goal LDL<70  Adjustment reaction with anxiety and depression Rarely uses lorazepam, but likes to have in case he needs.  Did not fill last printed RX and brought in for me to destroy.  Given another Rx for a smaller amount.  Erectile dysfunction Switch from viagra to cialis per patient request.    Need for shingles vaccine Recommended shingle vaccine through his drug store.     [name], MD [place]

## 2021-09-28 NOTE — Assessment & Plan Note (Signed)
Recommended shingle vaccine through his drug store.

## 2021-10-15 ENCOUNTER — Encounter: Payer: Self-pay | Admitting: *Deleted

## 2021-11-07 ENCOUNTER — Other Ambulatory Visit: Payer: Self-pay | Admitting: Family Medicine

## 2021-11-08 ENCOUNTER — Other Ambulatory Visit: Payer: Self-pay | Admitting: Family Medicine

## 2021-11-08 DIAGNOSIS — E7800 Pure hypercholesterolemia, unspecified: Secondary | ICD-10-CM

## 2021-11-17 ENCOUNTER — Ambulatory Visit: Payer: 59

## 2021-11-17 NOTE — Progress Notes (Deleted)
   11/17/2021 Harry Nash [place] 15-Jul-1955 [id number]   HPI:  Harry Nash is a 66 y.o. male patient of Harry Nash, who presents today for a lipid clinic evaluation.  See pertinent past medical history below.  Past Medical History: TIA 05/07/2019; left leg weakness, numbness, dizziness                Insurance Carrier:    Comptroller     Current Medications:        Cholesterol Goals:  LDL < 70   Intolerant/previously tried:   rosuvastatin - myalgias      Family history:   Diet:   Exercise:  teaches ballet; professional athlete for 30 years, now retired  Labs:   7/23:  TC 164, TG 67, HDL 59.6, LDL 91  Lipid Panel     Component Value Date/Time   CHOL 164 08/27/2021 1125   CHOL 159 07/20/2020 1006   TRIG 67.0 08/27/2021 1125   HDL 59.60 08/27/2021 1125   HDL 62 07/20/2020 1006   CHOLHDL 3 08/27/2021 1125   VLDL 13.4 08/27/2021 1125   LDLCALC 91 08/27/2021 1125   LDLCALC 81 07/20/2020 1006   LDLDIRECT 74 02/22/2021 1617   LDLDIRECT 117 (H) 02/12/2010 2025   LABVLDL 16 07/20/2020 1006     Current Outpatient Medications  Medication Sig Dispense Refill   clopidogrel (PLAVIX) 75 MG tablet TAKE 1 TABLET (75 MG) BY MOUTH DAILY 90 tablet 1   LORazepam (ATIVAN) 0.5 MG tablet TAKE ONE TABLET TWICE A DAY AS NEEDED FOR ANXIETy 45 tablet 0   tadalafil (CIALIS) 10 MG tablet Take 1 tablet (10 mg total) by mouth daily as needed for erectile dysfunction. 10 tablet 3   No current facility-administered medications for this visit.    Allergies  Allergen Reactions   Gabapentin Other (See Comments)    Clouded mentation    Past Medical History:  Diagnosis Date   Depression    Headache(784.0)    Kidney stones    TIA (transient ischemic attack)     There were no vitals taken for this visit.   No problem-specific Assessment & Plan notes found for this encounter.   Harry Nash PharmD CPP [facility] [address] [place], [place] [address] [phone]

## 2021-12-03 ENCOUNTER — Encounter: Payer: Self-pay | Admitting: Family Medicine

## 2021-12-17 ENCOUNTER — Telehealth: Payer: Self-pay | Admitting: Pharmacist

## 2021-12-17 ENCOUNTER — Ambulatory Visit: Payer: 59 | Attending: Cardiology | Admitting: Pharmacist

## 2021-12-17 ENCOUNTER — Encounter: Payer: Self-pay | Admitting: Pharmacist

## 2021-12-17 ENCOUNTER — Other Ambulatory Visit: Payer: Self-pay

## 2021-12-17 DIAGNOSIS — M791 Myalgia, unspecified site: Secondary | ICD-10-CM | POA: Diagnosis not present

## 2021-12-17 DIAGNOSIS — F4323 Adjustment disorder with mixed anxiety and depressed mood: Secondary | ICD-10-CM

## 2021-12-17 DIAGNOSIS — E7800 Pure hypercholesterolemia, unspecified: Secondary | ICD-10-CM

## 2021-12-17 DIAGNOSIS — Z8673 Personal history of transient ischemic attack (TIA), and cerebral infarction without residual deficits: Secondary | ICD-10-CM

## 2021-12-17 DIAGNOSIS — T466X5A Adverse effect of antihyperlipidemic and antiarteriosclerotic drugs, initial encounter: Secondary | ICD-10-CM | POA: Diagnosis not present

## 2021-12-17 DIAGNOSIS — N529 Male erectile dysfunction, unspecified: Secondary | ICD-10-CM

## 2021-12-17 MED ORDER — REPATHA SURECLICK 140 MG/ML ~~LOC~~ SOAJ: 1.0000 mL | SUBCUTANEOUS | 3 refills | Status: DC
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
## 2021-12-17 NOTE — Telephone Encounter (Signed)
PA for Repatha approved. No end date given.

## 2021-12-17 NOTE — Patient Instructions (Signed)
It was nice meeting you today  We would like your LDL (bad cholesterol) to be less than 70  We will start a new medication called Repatha or Praluent, both of which are given once every 2 weeks  I will complete the prior authorization for you and contact you when approved  Once you start the medication, we will recheck your cholesterol in 2-3 months  Please call or message with any questions  Karren Cobble, PharmD, [place], [place], [place], [place], [place], [address] Phone: [phone], Fax: [phone]

## 2021-12-20 MED ORDER — LORAZEPAM 0.5 MG PO TABS: ORAL_TABLET | ORAL | 0 refills | Status: DC

## 2021-12-20 MED ORDER — TADALAFIL 10 MG PO TABS: 10.0000 mg | ORAL_TABLET | Freq: Every day | ORAL | 3 refills | Status: DC | PRN

## 2021-12-20 MED ORDER — CLOPIDOGREL BISULFATE 75 MG PO TABS
ORAL_TABLET | ORAL | 1 refills | Status: DC
Start: 2021-12-20 — End: 2022-09-30

## 2022-01-17 ENCOUNTER — Encounter: Payer: Self-pay | Admitting: Pharmacist

## 2022-01-17 NOTE — Telephone Encounter (Signed)
Spoke to patient, advise to contact manufacturer to replace the faulty device. Reviewed the administration steps in detailed. Patient verbalizes the understanding. Patient states from the 1st injection he may have administered some of the medications so advised to wait for the next dose in 14 days.

## 2022-02-03 ENCOUNTER — Other Ambulatory Visit: Payer: Self-pay | Admitting: Family Medicine

## 2022-02-03 DIAGNOSIS — F4323 Adjustment disorder with mixed anxiety and depressed mood: Secondary | ICD-10-CM

## 2022-03-04 ENCOUNTER — Other Ambulatory Visit (INDEPENDENT_AMBULATORY_CARE_PROVIDER_SITE_OTHER): Payer: 59

## 2022-03-04 ENCOUNTER — Encounter: Payer: Self-pay | Admitting: Family Medicine

## 2022-03-04 DIAGNOSIS — R300 Dysuria: Secondary | ICD-10-CM | POA: Insufficient documentation

## 2022-03-04 LAB — POCT URINALYSIS DIP (CLINITEK)
Bilirubin, UA: NEGATIVE
Blood, UA: NEGATIVE
Glucose, UA: NEGATIVE mg/dL
Ketones, POC UA: NEGATIVE mg/dL
Leukocytes, UA: NEGATIVE
Nitrite, UA: NEGATIVE
POC PROTEIN,UA: NEGATIVE
Spec Grav, UA: 1.01 (ref 1.010–1.025)
Urobilinogen, UA: 0.2 E.U./dL
pH, UA: 6.5 (ref 5.0–8.0)

## 2022-03-04 NOTE — Telephone Encounter (Signed)
See my chart communication.

## 2022-03-07 LAB — URINE CULTURE

## 2022-03-08 ENCOUNTER — Encounter: Payer: Self-pay | Admitting: Family Medicine

## 2022-03-08 ENCOUNTER — Ambulatory Visit: Payer: 59 | Admitting: Family Medicine

## 2022-03-08 ENCOUNTER — Ambulatory Visit (INDEPENDENT_AMBULATORY_CARE_PROVIDER_SITE_OTHER): Payer: 59

## 2022-03-08 VITALS — Ht 71.0 in | Wt 187.0 lb

## 2022-03-08 DIAGNOSIS — M62830 Muscle spasm of back: Secondary | ICD-10-CM

## 2022-03-08 DIAGNOSIS — R109 Unspecified abdominal pain: Secondary | ICD-10-CM

## 2022-03-08 NOTE — Assessment & Plan Note (Addendum)
Patient still feels like it is more muscular than anything else.  We discussed potentially using the muscle relaxer and given Zanaflex for breakthrough.  Patient does not like gabapentin and we will avoid any neuromodulator.  Patient has had this pain intermittently for quite some time and we have discussed the possibility of advanced imaging.  Patient does not believe that this would change his medical management at this time and instead wanted to work with athletic trainer to return home exercises.  We discussed formal physical therapy but patient would like to try the home exercises first and follow-up with me again in 6 to 8 weeks otherwise.  Total time reviewing outside records, previous x-rays, previous labs 32 minutes

## 2022-03-08 NOTE — Patient Instructions (Signed)
Good to see you  Exercises 3 times a week.  One xray (KUB) today  Lets give it 6 weeks and check in again but write [place] if anything changes

## 2022-03-08 NOTE — Progress Notes (Signed)
Harry Nash [address] [place] Phone: [phone] Subjective:   Harry Nash, am serving as a scribe for [name].  I'm seeing this patient by the request  of:  [name], MD  CC: Low back pain  [id number]  08/30/2021 Patient does have tenderness to palpation again. And is concerned for some intermittent radicular symptoms. Discussed with patient about icing regimen and home exercises. Discussed which activities to do and which ones to avoid. Discussed with patient if this continues to occur I do feel that MRI is necessary. Patient is fairly confident that this is possibly secondary to his statin. Will ask patient to discuss with his primary care about the possibility of [place]. LDL was at 91 on last check   Updated 03/08/2022 Harry Nash is a 67 y.o. male coming in with complaint of LBP patient has had this on and off for quite some time.  History of kidney stones.  He does not know if this is potentially playing a role.  Recently did have a urine culture on February 2 of this month as well as a urinalysis showing no significant hematuria and no urine culture showing things     Past Medical History:  Diagnosis Date   Depression    Headache(784.0)    Kidney stones    TIA (transient ischemic attack)    Past Surgical History:  Procedure Laterality Date   HERNIA REPAIR     ROTATOR CUFF REPAIR     Social History   Socioeconomic History   Marital status: Married    Spouse name: Not on file   Number of children: 3   Years of education: Not on file   Highest education level: Not on file  Occupational History   Not on file  Tobacco Use   Smoking status: Former    Types: Cigarettes    Quit date: 02/01/2003    Years since quitting: 19.1   Smokeless tobacco: Never  Vaping Use   Vaping Use: Never used  Substance and Sexual Activity   Alcohol use: Yes    Alcohol/week: 1.0 standard drink of alcohol     Types: 1 Cans of beer per week    Comment: moderate   Drug use: No   Sexual activity: Yes  Other Topics Concern   Not on file  Social History Narrative   Not on file   Social Determinants of Health   Financial Resource Strain: Not on file  Food Insecurity: Not on file  Transportation Needs: Not on file  Physical Activity: Not on file  Stress: Not on file  Social Connections: Not on file   Allergies  Allergen Reactions   Gabapentin Other (See Comments)    Clouded mentation   Family History  Problem Relation Age of Onset   Heart disease Mother    Heart disease Father    Hypertension Father      Current Outpatient Medications (Cardiovascular):    Evolocumab (REPATHA SURECLICK) 025 MG/ML SOAJ, Inject 140 mg into the skin every 14 (fourteen) days.   tadalafil (CIALIS) 10 MG tablet, Take 1 tablet (10 mg total) by mouth daily as needed for erectile dysfunction.    Current Outpatient Medications (Hematological):    clopidogrel (PLAVIX) 75 MG tablet, TAKE 1 TABLET (75 MG) BY MOUTH DAILY  Current Outpatient Medications (Other):    LORazepam (ATIVAN) 0.5 MG tablet, TAKE ONE TABLET TWICE DAILY AS NEEDED FOR ANXIETY   Reviewed prior  external information including notes and imaging from  primary care provider As well as notes that were available from care everywhere and other healthcare systems.  Past medical history, social, surgical and family history all reviewed in electronic medical record.  No pertanent information unless stated regarding to the chief complaint.   Review of Systems:  No headache, visual changes, nausea, vomiting, diarrhea, constipation, dizziness, abdominal pain, skin rash, fevers, chills, night sweats, weight loss, swollen lymph nodes, body aches, joint swelling, chest pain, shortness of breath, mood changes. POSITIVE muscle aches  Objective  Height '5\' 11"'$  (1.803 m), weight 187 lb (84.8 kg).   General: No apparent distress alert and oriented x3 mood  and affect normal, dressed appropriately.  HEENT: Pupils equal, extraocular movements intact  Respiratory: Patient's speak in full sentences and does not appear short of breath  Cardiovascular: No lower extremity edema, non tender, no erythema  Low back exam does have some loss of lordosis.  Tenderness to palpation in the paraspinal musculature.  Seems to be more right greater than left.  Tightness in the piriformis.  And some on the left area as well but seems to be more of the iliac [place] on the right.  Some mild tightness noted with [place]y Sox right greater than left.  97110; 15 additional minutes spent for Therapeutic exercises as stated in above notes.  This included exercises focusing on stretching, strengthening, with significant focus on eccentric aspects.   Long term goals include an improvement in range of motion, strength, endurance as well as avoiding reinjury. Patient's frequency would include in 1-2 times a day, 3-5 times a week for a duration of 6-12 weeks. Low back exercises that included:  Pelvic tilt/bracing instruction to focus on control of the pelvic girdle and lower abdominal muscles  Glute strengthening exercises, focusing on proper firing of the glutes without engaging the low back muscles Proper stretching techniques for maximum relief for the hamstrings, hip flexors, low back and some rotation where tolerated  Proper technique shown and discussed handout in great detail with ATC.  All questions were discussed and answered.      Impression and Recommendations:     The above documentation has been reviewed and is accurate and complete Lyndal Pulley, DO

## 2022-03-13 ENCOUNTER — Encounter: Payer: Self-pay | Admitting: Family Medicine

## 2022-03-14 ENCOUNTER — Encounter: Payer: Self-pay | Admitting: Family Medicine

## 2022-03-14 ENCOUNTER — Telehealth (INDEPENDENT_AMBULATORY_CARE_PROVIDER_SITE_OTHER): Payer: 59 | Admitting: Family Medicine

## 2022-03-14 DIAGNOSIS — M62830 Muscle spasm of back: Secondary | ICD-10-CM

## 2022-03-14 NOTE — Assessment & Plan Note (Signed)
Chronic recurrent pain.  With clean UA, very low likelihood of kidney stone.  Exercises as per Dr. Tamala Julian.

## 2022-03-14 NOTE — Progress Notes (Signed)
    SUBJECTIVE:   CHIEF COMPLAINT / HPI:   Tried and failed video visit.  Ended up with a phone visit.Persistent ache in back.  History of kidney stones.  Urine was clear, no blood or infection.  More on right than left.  Known degenerative changes in lumbar spine.  Has seen Charlann Boxer in [place], who felt musculoskeletal and prescribed exercises.     Has had flu and covid.  Has had colonoscopy at [facility].  We do not have records.  He is aware he needs tetanus, shingles and pneumonia.      OBJECTIVE:    Exam can't be done via telephone.  ASSESSMENT/PLAN:   Lumbar paraspinal muscle spasm Chronic recurrent pain.  With clean UA, very low likelihood of kidney stone.  Exercises as per Dr. Tamala Julian.     [name], MD [place]

## 2022-03-14 NOTE — Patient Instructions (Signed)
Please do the exercises as prescribed by Dr. Tamala Julian. You are due for a tetanus booster, pneumonia vaccine and shingles vaccines (series of two.)   Stay healthy.  It has been a pleasure caring for you.

## 2022-03-23 NOTE — Progress Notes (Signed)
I was in my office.  The patient was in his home.  The duration of the call was 17 minutes.

## 2022-03-30 ENCOUNTER — Ambulatory Visit: Payer: 59 | Admitting: Family Medicine

## 2022-04-06 ENCOUNTER — Other Ambulatory Visit: Payer: Self-pay | Admitting: Family Medicine

## 2022-04-06 DIAGNOSIS — F4323 Adjustment disorder with mixed anxiety and depressed mood: Secondary | ICD-10-CM

## 2022-04-07 NOTE — Telephone Encounter (Signed)
Patient needs appointment with PCP before further refills of lorazepam

## 2022-04-20 ENCOUNTER — Ambulatory Visit: Payer: 59 | Admitting: Family Medicine

## 2022-05-30 ENCOUNTER — Encounter (HOSPITAL_BASED_OUTPATIENT_CLINIC_OR_DEPARTMENT_OTHER): Payer: Self-pay

## 2022-05-30 DIAGNOSIS — I83813 Varicose veins of bilateral lower extremities with pain: Secondary | ICD-10-CM

## 2022-05-31 NOTE — Telephone Encounter (Signed)
Recommend referral to [facility]  Alver Sorrow, NP

## 2022-07-04 ENCOUNTER — Ambulatory Visit (INDEPENDENT_AMBULATORY_CARE_PROVIDER_SITE_OTHER): Payer: 59

## 2022-07-04 ENCOUNTER — Ambulatory Visit: Payer: 59 | Admitting: Family Medicine

## 2022-07-04 ENCOUNTER — Encounter: Payer: Self-pay | Admitting: Family Medicine

## 2022-07-04 VITALS — BP 130/82 | HR 86 | Ht 71.0 in | Wt 191.4 lb

## 2022-07-04 DIAGNOSIS — M25511 Pain in right shoulder: Secondary | ICD-10-CM

## 2022-07-04 DIAGNOSIS — M79671 Pain in right foot: Secondary | ICD-10-CM

## 2022-07-04 MED ORDER — TIZANIDINE HCL 4 MG PO TABS: 4.0000 mg | ORAL_TABLET | Freq: Three times a day (TID) | ORAL | 1 refills | Status: DC | PRN

## 2022-07-04 NOTE — Patient Instructions (Signed)
Thank you for coming in today.   Please get an Xray today before you leave   I've referred you to Physical Therapy.  Let us know if you don't hear from them in one week.   Try tizinadine at bedtime.   Ok to use a cam walker boot like a crutch if you needed.   Ok to try a night splint for plantar fasciitis at bedtime.   Use good heel cushion and good ice massage in the evening.   Do the exercises with the step. Go from up to down slowly.

## 2022-07-04 NOTE — Progress Notes (Signed)
I, Stevenson Clinch, CMA acting as a scribe for Clementeen Graham, MD.  Harry Nash is a 67 y.o. male who presents to [facility] at [facility] today for bilateral foot and upper back pain. Pt was previously seen by Dr. Katrinka Blazing on 03/08/22 for low back and abdominal pain.   Today, pt c/o bilat foot pain x 1 month, injection for left foot in the past. Pt locates pain to the heel, right. Denies swelling. Nerve-type pain in the feet. Trigger unknown. Sx worse when barefoot, better when wearing [employer] shoes that are on today.   Aggravates: first morning steps, WB, ambulation Treatments tried: foam rolling, Tiger Balm, ice  He also c/o thoracic back pain x 2-3 weeks. Sx started while increasing weight at the gym. Pt locates pain to right side, deep to scapula. RHD. Has been avoiding triggering work outs. Sx improve after being up upright position. Denied neck pain, radiating pain into the arms, weakness, decreased grip strength.   Radiates: no Aggravates: first thing in the morning.  Treatments tried: Advil massage  Dx imaging: 08/12/21 L-spine & R hip XR  Pertinent review of systems: No fevers or chills  Relevant historical information: History several musculoskeletal injuries and pains including biceps tendon tear.  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   Exam:  BP 130/82   Pulse 86   Ht 5\' 11"  (1.803 m)   Wt 191 lb 6.4 oz (86.8 kg)   SpO2 96%   BMI 26.69 kg/m  General: Well Developed, well nourished, and in no acute distress.   MSK: Left foot subcutaneous nodules present in calcaneus.  Tender palpation plantar calcaneus. Normal foot and ankle motion.  Intact strength.  Right trapezius tender palpation superior portion of periscapular area at the medial superior scapular border.  Normal scapular motion.    Lab and Radiology Results  X-ray images right scapula obtained today personally and independently interpreted No acute bony abnormalities.   Normal-appearing lung field right upper lung Await formal radiology review     Assessment and Plan: 67 y.o. male with right plantar foot pain thought to be due to Planter fasciitis.  Maximize conservative management with good heel cushioning ice massage eccentric exercises and night splints.  If not improving in a month consider injection.  Pain right upper medial scapular border thought to be muscle dysfunction and spasm of trapezius or rhomboid.  Plan for physical therapy heating pad and tizanidine at bedtime.  If not improved in a month or 2 consider trigger point injection.  Also consider OMT.   PDMP not reviewed this encounter. Orders Placed This Encounter  Procedures   DG Scapula Right    Standing Status:   Future    Number of Occurrences:   1    Standing Expiration Date:   07/04/2023    Order Specific Question:   Reason for Exam (SYMPTOM  OR DIAGNOSIS REQUIRED)    Answer:   eval pain rt scpula area    Order Specific Question:   Preferred imaging location?    Answer:   [facility]   Ambulatory referral to Physical Therapy    Referral Priority:   Routine    Referral Type:   Physical Medicine    Referral Reason:   Specialty Services Required    Requested Specialty:   Physical Therapy    Number of Visits Requested:   1   Meds ordered this encounter  Medications   tiZANidine (ZANAFLEX) 4 MG tablet  Sig: Take 1 tablet (4 mg total) by mouth every 8 (eight) hours as needed for muscle spasms.    Dispense:  60 tablet    Refill:  1     Discussed warning signs or symptoms. Please see discharge instructions. Patient expresses understanding.   The above documentation has been reviewed and is accurate and complete Clementeen Graham, M.D.

## 2022-07-08 NOTE — Progress Notes (Signed)
Right scapula x-ray looks normal to radiology.

## 2022-07-12 ENCOUNTER — Other Ambulatory Visit: Payer: Self-pay | Admitting: *Deleted

## 2022-07-12 DIAGNOSIS — I8393 Asymptomatic varicose veins of bilateral lower extremities: Secondary | ICD-10-CM

## 2022-07-13 NOTE — Therapy (Signed)
OUTPATIENT PHYSICAL THERAPY EVALUATION   Patient Name: Harry Nash MRN: [id number] DOB:01-18-1956, 67 y.o., male Today's Date: 07/14/2022  END OF SESSION:  PT End of Session - 07/14/22 0926     Visit Number 1    Number of Visits 20    Date for PT Re-Evaluation 09/22/22    Authorization Type [employer] 10% coinsurance    Authorization - Number of Visits 59    Progress Note Due on Visit 10    PT Start Time 0929    PT Stop Time 1012    PT Time Calculation (min) 43 min    Activity Tolerance Patient tolerated treatment well    Behavior During Therapy [facility] for tasks assessed/performed             Past Medical History:  Diagnosis Date   Depression    Headache(784.0)    Kidney stones    TIA (transient ischemic attack)    Past Surgical History:  Procedure Laterality Date   HERNIA REPAIR     ROTATOR CUFF REPAIR     Patient Active Problem List   Diagnosis Date Noted   Dysuria 03/04/2022   Need for shingles vaccine 09/28/2021   Vit B12 defic anemia d/t slctv vit B12 malabsorp w protein 08/30/2021   Lumbar paraspinal muscle spasm 04/27/2021   Myalgia due to statin 02/22/2021   Nocturia 02/22/2021   History of TIA (transient ischemic attack) 07/20/2020   Left rotator cuff tear 02/21/2020   Rotator cuff tear, right 08/29/2019   Cervical spinal stenosis 05/13/2019   Family history of heart disease 03/06/2019   Biceps tendon tear 04/03/2018   Neuroma of third interspace of left foot 02/02/2018   Diverticulitis of colon 11/08/2016   Adrenal incidentaloma (HCC) 11/08/2016   Tarsal tunnel syndrome of left side 11/02/2015   Erectile dysfunction 07/23/2015   Plantar fasciitis of left foot 04/08/2015   Back pain 04/08/2015   Degenerative disc disease, cervical 04/08/2015   Nonallopathic lesion of lumbosacral region 04/08/2015   Nonallopathic lesion-rib cage 04/08/2015   Nonallopathic lesion of cervical region 04/08/2015   Tibialis posterior tendon tear, nontraumatic  02/18/2015   Routine general medical examination at a health care facility 02/10/2012   Adjustment reaction with anxiety and depression 11/11/2011   Prostate cancer screening 09/20/2010   HYPERCHOLESTEROLEMIA 08/19/2009   COLONIC POLYPS, ADENOMATOUS 08/12/2009    PCP: McDiarmid, Todd MD  REFERRING PROVIDER: Rodolph Bong, MD  REFERRING DIAG: [phone] (ICD-10-CM) - Pain of right heel M25.511 (ICD-10-CM) - Acute pain of right shoulder  THERAPY DIAG:  Pain in right ankle and joints of right foot  Pain in thoracic spine  Pain in left ankle and joints of left foot  Muscle weakness (generalized)  Difficulty in walking, not elsewhere classified  Rationale for Evaluation and Treatment: Rehabilitation  ONSET DATE: 06/2022  SUBJECTIVE:  SUBJECTIVE STATEMENT: Pt indicated really having complaints of bilateral heel/foot complaints for 3 weeks or so, Rt > Lt.  Pt also indicated complaints Rt posterior shoulder/thoracic symptom > 3 weeks.   Reported difficulty getting out with pain in both areas.  He indicated trying to do stretching for feet that has helped some.   Pt indicated no walking up at night due to symptoms.   Reported feeling he was limping at times due to symptoms.   Saw Dr. Denyse Amass with xray unremarkable.  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   PERTINENT HISTORY: Reported history of foot pain in past with injection.   PAIN:  NPRS scale: feet at worst 7/10, shoulder/back at worst 7/10 Pain location: bilateral feet, Rt posterior shoulder/thoracic Pain description: feet:  burning.   Rt shoulder/thoracic:  spasm Aggravating factors: morning complaints, worse in mornings and early walking (for feet), movement after prolonged sitting.  Relieving factors: stretching for feet, OTC medicine (doesn't help  much)  PRECAUTIONS: None  WEIGHT BEARING RESTRICTIONS: No  FALLS:  Has patient fallen in last 6 months? No  LIVING ENVIRONMENT: Lives in: House/apartment Stairs: 2 story house with bedroom on 2nd floor.   OCCUPATION: Just retired.  PLOF: Independent, walking dog,   PATIENT GOALS:Reduce pain, walk better.    OBJECTIVE:   PATIENT SURVEYS:  07/14/2022 FOTO intake:   52  predicted:  65  COGNITION: 07/14/2022 Overall cognitive status: WFL     SENSATION: 07/14/2022 WFL  POSTURE: 07/14/2022 Rounder shoulders, mild forward head posture  ROM:  07/14/2022:   Cervical AROM WFL s symptoms.  Thoracic AROM WFL with symptoms noted in Rt posterior shoulder/thoracic region c Rt rotation.   Prone thoracic cPA WFL throughout with pain complaints.    ROM Right 07/14/2022 Left 07/14/2022  Shoulder flexion [facility]  Shoulder extension    Shoulder abduction    Shoulder adduction    Shoulder internal rotation    Shoulder external rotation    Elbow flexion    Elbow extension        Ankle DF 9 AROM in seated knee flexion 90 deg 8 AROM in seated knee flexion 90 deg  Ankle PF 58 AROM in seated knee flexion 90 deg 50 AROM in seated knee flexion 90 deg  Ankle inversion    Ankle eversion        (Blank rows = not tested)  MMT:  MMT Right 07/14/2022 Left 07/14/2022  Shoulder flexion 5/5 5/5  Shoulder extension    Shoulder abduction 5/5 5/5  Shoulder adduction    Shoulder internal rotation 5/5 5/5  Shoulder external rotation 4+/5 5/5  Middle trapezius    Lower trapezius    Elbow flexion 5/5 5/5  Elbow extension 5/5 5/5      Ankle DF 5/5 5/5  Ankle PF 4/5  10 reps heel raise SL in standing  4/5 12 reps heel raise SL in standing  Ankle inversion 5/5 5/5  Ankle eversion 5/5 5/5          (Blank rows = not tested)  SPECIAL TESTS: 07/14/2022 (+) windlass bilateral.   (-) shrug, drop arm bilateral   FUNCTIONAL TESTING; 07/14/2022: Lt SLS: 8 seconds  Rt SLS: 8 second c  increased aberrant movement  PALPATION:  07/14/2022 Trigger points c concordant symptoms in Rt mid thoracic C4-C7 paraspinals, rhomboids.  Trigger points throughout medial and lateral gastroc heads bilateral  GAIT Reduced toe of progression , noted more in Rt.  TODAY'S TREATMENT:                                                                                                       DATE: 07/14/2022 Therex:    HEP instruction/performance c cues for techniques, handout provided.  Trial set performed of each for comprehension and symptom assessment.  See below for exercise list   PATIENT EDUCATION: 07/14/2022 Education details: HEP, POC Person educated: Patient Education method: Explanation, Demonstration, Verbal cues, and Handouts Education comprehension: verbalized understanding, returned demonstration, and verbal cues required  HOME EXERCISE PROGRAM: Access Code: [email or web address] URL: [email or web address] Date: 07/14/2022 Prepared by: Chyrel Masson  Exercises - Shoulder External Rotation and Scapular Retraction  - 3-5 x daily - 7 x weekly - 1 sets - 5-10 reps - 2 hold - Seated 55 Gallon Barrel Hug Stretch  - 3-5 x daily - 7 x weekly - 1 sets - 5-10 reps - 2 hold - Standing Bilateral Gastroc Stretch with Step  - 2-3 x daily - 7 x weekly - 1 sets - 5 reps - 30 hold - Standing Dorsiflexion Self-Mobilization on Step  - 2-3 x daily - 7 x weekly - 1 sets - 10 reps - 2-3 hold  ASSESSMENT:  CLINICAL IMPRESSION: Patient is a 67 y.o. who comes to clinic with complaints of Rt thoracic/posterior shoulder, bilateral foot pain with mobility, strength and movement coordination deficits that impair their ability to perform usual daily and recreational functional activities without increase  difficulty/symptoms at this time.  Patient to benefit from skilled PT services to address impairments and limitations to improve to previous level of function without restriction secondary to condition.   OBJECTIVE IMPAIRMENTS: Abnormal gait, decreased activity tolerance, decreased balance, decreased coordination, decreased endurance, decreased mobility, difficulty walking, decreased ROM, decreased strength, hypomobility, increased fascial restrictions, impaired perceived functional ability, increased muscle spasms, impaired flexibility, impaired UE functional use, improper body mechanics, postural dysfunction, and pain.   ACTIVITY LIMITATIONS: carrying, lifting, bending, standing, stairs, reach over head, and locomotion level  PARTICIPATION LIMITATIONS: interpersonal relationship and community activity  PERSONAL FACTORS:  Multiple treatment areas  are also affecting patient's functional outcome.   REHAB POTENTIAL: Good  CLINICAL DECISION MAKING: Stable/uncomplicated  EVALUATION COMPLEXITY: Low   GOALS: Goals reviewed with patient? Yes  SHORT TERM GOALS: (target date for Short term goals are 3 weeks 08/04/2022)  1.Patient will demonstrate independent use of home exercise program to maintain progress from in clinic treatments. Goal status: New  LONG TERM GOALS: (target dates for all long term goals are 10 weeks  09/22/2022 )   1. Patient will demonstrate/report pain at worst less than or equal to 2/10 to facilitate minimal limitation in daily activity secondary to pain symptoms. Goal status: New   2. Patient will demonstrate independent use of home exercise program to facilitate ability to maintain/progress functional gains from skilled physical therapy services. Goal status: New   3. Patient will demonstrate FOTO outcome > or = 65 % to indicate reduced disability due to condition. Goal status: New   4.  Patient will demonstrate Rt UE  MMT 5/5 throughout to facilitate lifting,  reaching, carrying at [facility] in daily activity.   Goal status: New   5.  Patient will demonstrate thoracic AROM s symptoms for usual mobility in daily life.    Goal status: New   6.  Patient will demonstrate Rt PF 5/5 s symptoms to facilitate usual walking, standing activity.  Goal status: New   7.  Patient will demonstrate bilateral DF > 10 deg to facilitate toe off progression in ambulation.  Goal Status: New  PLAN:  PT FREQUENCY: 1-2x/week  PT DURATION: 10 weeks  PLANNED INTERVENTIONS: Therapeutic exercises, Therapeutic activity, Neuro Muscular re-education, Balance training, Gait training, Patient/Family education, Joint mobilization, Stair training, DME instructions, Dry Needling, Electrical stimulation, Traction, Cryotherapy, vasopneumatic device Moist heat, Taping, Ultrasound, Ionotophoresis 4mg /ml Dexamethasone, and aquatic therapy ,Manual therapy.  All included unless contraindicated  PLAN FOR NEXT SESSION: Review HEP knowledge/results.   Possible dry needling to Rt thoracic multifidi, rhomboids, gastroc.     Chyrel Masson, PT, DPT, OCS, ATC 07/14/22  10:31 AM

## 2022-07-14 ENCOUNTER — Ambulatory Visit: Payer: 59 | Admitting: Rehabilitative and Restorative Service Providers"

## 2022-07-14 ENCOUNTER — Encounter: Payer: Self-pay | Admitting: Rehabilitative and Restorative Service Providers"

## 2022-07-14 ENCOUNTER — Other Ambulatory Visit: Payer: Self-pay

## 2022-07-14 DIAGNOSIS — G8929 Other chronic pain: Secondary | ICD-10-CM

## 2022-07-14 DIAGNOSIS — M25572 Pain in left ankle and joints of left foot: Secondary | ICD-10-CM | POA: Diagnosis not present

## 2022-07-14 DIAGNOSIS — M25571 Pain in right ankle and joints of right foot: Secondary | ICD-10-CM | POA: Diagnosis not present

## 2022-07-14 DIAGNOSIS — M546 Pain in thoracic spine: Secondary | ICD-10-CM

## 2022-07-14 DIAGNOSIS — M25511 Pain in right shoulder: Secondary | ICD-10-CM

## 2022-07-14 DIAGNOSIS — M6281 Muscle weakness (generalized): Secondary | ICD-10-CM

## 2022-07-14 DIAGNOSIS — R262 Difficulty in walking, not elsewhere classified: Secondary | ICD-10-CM

## 2022-07-19 ENCOUNTER — Ambulatory Visit: Payer: 59 | Admitting: Rehabilitative and Restorative Service Providers"

## 2022-07-19 ENCOUNTER — Encounter: Payer: Self-pay | Admitting: Rehabilitative and Restorative Service Providers"

## 2022-07-19 DIAGNOSIS — M25571 Pain in right ankle and joints of right foot: Secondary | ICD-10-CM

## 2022-07-19 DIAGNOSIS — M6281 Muscle weakness (generalized): Secondary | ICD-10-CM | POA: Diagnosis not present

## 2022-07-19 DIAGNOSIS — M546 Pain in thoracic spine: Secondary | ICD-10-CM | POA: Diagnosis not present

## 2022-07-19 DIAGNOSIS — M25572 Pain in left ankle and joints of left foot: Secondary | ICD-10-CM

## 2022-07-19 DIAGNOSIS — R262 Difficulty in walking, not elsewhere classified: Secondary | ICD-10-CM

## 2022-07-19 NOTE — Therapy (Signed)
OUTPATIENT PHYSICAL THERAPY TREATMENT   Patient Name: Achilleus Vanarsdall Hartog MRN: [id number] DOB:1955-06-10, 67 y.o., male Today's Date: 07/19/2022  END OF SESSION:  PT End of Session - 07/19/22 1423     Visit Number 2    Number of Visits 20    Date for PT Re-Evaluation 09/22/22    Authorization Type [employer] 10% coinsurance    Authorization - Number of Visits 59    Progress Note Due on Visit 10    PT Start Time 1428    PT Stop Time 1456    PT Time Calculation (min) 28 min    Activity Tolerance Other (comment)   fair tolerance   Behavior During Therapy WFL for tasks assessed/performed              Past Medical History:  Diagnosis Date   Depression    Headache(784.0)    Kidney stones    TIA (transient ischemic attack)    Past Surgical History:  Procedure Laterality Date   HERNIA REPAIR     ROTATOR CUFF REPAIR     Patient Active Problem List   Diagnosis Date Noted   Dysuria 03/04/2022   Need for shingles vaccine 09/28/2021   Vit B12 defic anemia d/t slctv vit B12 malabsorp w protein 08/30/2021   Lumbar paraspinal muscle spasm 04/27/2021   Myalgia due to statin 02/22/2021   Nocturia 02/22/2021   History of TIA (transient ischemic attack) 07/20/2020   Left rotator cuff tear 02/21/2020   Rotator cuff tear, right 08/29/2019   Cervical spinal stenosis 05/13/2019   Family history of heart disease 03/06/2019   Biceps tendon tear 04/03/2018   Neuroma of third interspace of left foot 02/02/2018   Diverticulitis of colon 11/08/2016   Adrenal incidentaloma (HCC) 11/08/2016   Tarsal tunnel syndrome of left side 11/02/2015   Erectile dysfunction 07/23/2015   Plantar fasciitis of left foot 04/08/2015   Back pain 04/08/2015   Degenerative disc disease, cervical 04/08/2015   Nonallopathic lesion of lumbosacral region 04/08/2015   Nonallopathic lesion-rib cage 04/08/2015   Nonallopathic lesion of cervical region 04/08/2015   Tibialis posterior tendon tear, nontraumatic  02/18/2015   Routine general medical examination at a health care facility 02/10/2012   Adjustment reaction with anxiety and depression 11/11/2011   Prostate cancer screening 09/20/2010   HYPERCHOLESTEROLEMIA 08/19/2009   COLONIC POLYPS, ADENOMATOUS 08/12/2009    PCP: McDiarmid, Todd MD  REFERRING PROVIDER: Rodolph Bong, MD  REFERRING DIAG: [phone] (ICD-10-CM) - Pain of right heel M25.511 (ICD-10-CM) - Acute pain of right shoulder  THERAPY DIAG:  Pain in thoracic spine  Pain in right ankle and joints of right foot  Pain in left ankle and joints of left foot  Muscle weakness (generalized)  Difficulty in walking, not elsewhere classified  Rationale for Evaluation and Treatment: Rehabilitation  ONSET DATE: 06/2022  SUBJECTIVE:  SUBJECTIVE STATEMENT: He indicated feeling better overall.  Still having some complaints but noticing improvement.   Reported back doing better than feet.   PERTINENT HISTORY: Reported history of foot pain in past with injection.   PAIN:  NPRS scale: feet current 5/10 shoulder/back current 0/10 Pain location: bilateral feet, Rt posterior shoulder/thoracic Pain description: feet:  burning.   Rt shoulder/thoracic:  spasm Aggravating factors: morning complaints, worse in mornings and early walking (for feet), movement after prolonged sitting.  Relieving factors: stretching for feet, OTC medicine (doesn't help much)  PRECAUTIONS: None  WEIGHT BEARING RESTRICTIONS: No  FALLS:  Has patient fallen in last 6 months? No  LIVING ENVIRONMENT: Lives in: House/apartment Stairs: 2 story house with bedroom on 2nd floor.   OCCUPATION: Just retired.  PLOF: Independent, walking dog,   PATIENT GOALS:Reduce pain, walk better.    OBJECTIVE:   PATIENT SURVEYS:   07/14/2022 FOTO intake:   52  predicted:  65  COGNITION: 07/14/2022 Overall cognitive status: WFL     SENSATION: 07/14/2022 WFL  POSTURE: 07/14/2022 Rounder shoulders, mild forward head posture  ROM:  07/14/2022:   Cervical AROM WFL s symptoms.  Thoracic AROM WFL with symptoms noted in Rt posterior shoulder/thoracic region c Rt rotation.   Prone thoracic cPA WFL throughout with pain complaints.    ROM Right 07/14/2022 Left 07/14/2022  Shoulder flexion [facility]  Shoulder extension    Shoulder abduction    Shoulder adduction    Shoulder internal rotation    Shoulder external rotation    Elbow flexion    Elbow extension        Ankle DF 9 AROM in seated knee flexion 90 deg 8 AROM in seated knee flexion 90 deg  Ankle PF 58 AROM in seated knee flexion 90 deg 50 AROM in seated knee flexion 90 deg  Ankle inversion    Ankle eversion        (Blank rows = not tested)  MMT:  MMT Right 07/14/2022 Left 07/14/2022  Shoulder flexion 5/5 5/5  Shoulder extension    Shoulder abduction 5/5 5/5  Shoulder adduction    Shoulder internal rotation 5/5 5/5  Shoulder external rotation 4+/5 5/5  Middle trapezius    Lower trapezius    Elbow flexion 5/5 5/5  Elbow extension 5/5 5/5      Ankle DF 5/5 5/5  Ankle PF 4/5  10 reps heel raise SL in standing  4/5 12 reps heel raise SL in standing  Ankle inversion 5/5 5/5  Ankle eversion 5/5 5/5          (Blank rows = not tested)  SPECIAL TESTS: 07/14/2022 (+) windlass bilateral.   (-) shrug, drop arm bilateral   FUNCTIONAL TESTING; 07/14/2022: Lt SLS: 8 seconds  Rt SLS: 8 second c increased aberrant movement  PALPATION:  07/14/2022 Trigger points c concordant symptoms in Rt mid thoracic C4-C7 paraspinals, rhomboids.  Trigger points throughout medial and lateral gastroc heads bilateral  GAIT 07/13/2012 Reduced toe of progression , noted more in Rt.  TODAY'S TREATMENT:                                                                                                       DATE: 07/19/2022 Therex:  Incline gastroc stretch standing bilateral 30 sec x 5  Standing double leg PF with single leg eccentrics x 10 bilateral  Review of existing HEP verbally   Manual Compression with percussive device STM to bilateral calf musculature.  Skilled palpation with dry needling.  Trigger Point Dry-Needling  Treatment instructions: Expect mild to moderate muscle soreness. S/S of pneumothorax if dry needled over a lung field, and to seek immediate medical attention should they occur. Patient verbalized understanding of these instructions and education.  Patient Consent Given: Yes Education handout provided: Previously provided Muscles treated: Rt T5-T7 multifidi Treatment response/outcome: twitch response c concordant symptom    TODAY'S TREATMENT:                                                                                                       DATE: 07/14/2022 Therex:    HEP instruction/performance c cues for techniques, handout provided.  Trial set performed of each for comprehension and symptom assessment.  See below for exercise list   PATIENT EDUCATION: 07/14/2022 Education details: HEP, POC Person educated: Patient Education method: Explanation, Demonstration, Verbal cues, and Handouts Education comprehension: verbalized understanding, returned demonstration, and verbal cues required  HOME EXERCISE PROGRAM: Access Code: [email or web address] URL: [email or web address] Date: 07/14/2022 Prepared by: Chyrel Masson  Exercises - Shoulder External Rotation and Scapular Retraction  - 3-5 x daily - 7 x weekly - 1 sets - 5-10 reps - 2 hold - Seated 55 Gallon Barrel Hug Stretch  - 3-5 x daily - 7 x weekly - 1 sets - 5-10 reps - 2 hold -  Standing Bilateral Gastroc Stretch with Step  - 2-3 x daily - 7 x weekly - 1 sets - 5 reps - 30 hold - Standing Dorsiflexion Self-Mobilization on Step  - 2-3 x daily - 7 x weekly - 1 sets - 10 reps - 2-3 hold  ASSESSMENT:  CLINICAL IMPRESSION: Strong reproduction of concordant symptoms in thoracic spine with needling.  Lateral calf trigger points noted as most evident spot in lower extremity.  Continued skilled PT services indicated at this time.   OBJECTIVE IMPAIRMENTS: Abnormal gait, decreased activity tolerance, decreased balance, decreased coordination, decreased endurance, decreased mobility, difficulty walking, decreased ROM, decreased strength, hypomobility, increased fascial restrictions, impaired perceived functional ability, increased muscle spasms, impaired flexibility, impaired UE functional use, improper body mechanics, postural dysfunction, and pain.   ACTIVITY LIMITATIONS: carrying, lifting, bending, standing, stairs, reach over head, and locomotion level  PARTICIPATION LIMITATIONS: interpersonal relationship and community activity  PERSONAL FACTORS:  Multiple treatment areas  are also affecting patient's functional outcome.   REHAB POTENTIAL: Good  CLINICAL DECISION MAKING: Stable/uncomplicated  EVALUATION COMPLEXITY: Low   GOALS: Goals reviewed with patient? Yes  SHORT TERM GOALS: (target date for Short term goals are 3 weeks 08/04/2022)  1.Patient will demonstrate independent use of home exercise program to maintain progress from in clinic treatments. Goal status: on going 07/19/2022  LONG TERM GOALS: (target dates for all long term goals are 10 weeks  09/22/2022 )   1. Patient will demonstrate/report pain at worst less than or equal to 2/10 to facilitate minimal limitation in daily activity secondary to pain symptoms. Goal status: New   2. Patient will demonstrate independent use of home exercise program to facilitate ability to maintain/progress functional gains  from skilled physical therapy services. Goal status: New   3. Patient will demonstrate FOTO outcome > or = 65 % to indicate reduced disability due to condition. Goal status: New   4.  Patient will demonstrate Rt UE MMT 5/5 throughout to facilitate lifting, reaching, carrying at [facility] in daily activity.   Goal status: New   5.  Patient will demonstrate thoracic AROM s symptoms for usual mobility in daily life.    Goal status: New   6.  Patient will demonstrate Rt PF 5/5 s symptoms to facilitate usual walking, standing activity.  Goal status: New   7.  Patient will demonstrate bilateral DF > 10 deg to facilitate toe off progression in ambulation.  Goal Status: New  PLAN:  PT FREQUENCY: 1-2x/week  PT DURATION: 10 weeks  PLANNED INTERVENTIONS: Therapeutic exercises, Therapeutic activity, Neuro Muscular re-education, Balance training, Gait training, Patient/Family education, Joint mobilization, Stair training, DME instructions, Dry Needling, Electrical stimulation, Traction, Cryotherapy, vasopneumatic device Moist heat, Taping, Ultrasound, Ionotophoresis 4mg /ml Dexamethasone, and aquatic therapy ,Manual therapy.  All included unless contraindicated  PLAN FOR NEXT SESSION: Increase total treatment time as indicated as dry needling/manual tolerance improves.    Chyrel Masson, PT, DPT, OCS, ATC 07/19/22  3:03 PM

## 2022-07-20 ENCOUNTER — Ambulatory Visit (HOSPITAL_COMMUNITY)
Admission: RE | Admit: 2022-07-20 | Discharge: 2022-07-20 | Disposition: A | Discharge status: Home / Self Care | Payer: 59 | Source: Ambulatory Visit | Attending: Vascular Surgery | Admitting: Vascular Surgery

## 2022-07-20 ENCOUNTER — Ambulatory Visit: Payer: 59 | Admitting: Vascular Surgery

## 2022-07-20 ENCOUNTER — Encounter: Payer: Self-pay | Admitting: Vascular Surgery

## 2022-07-20 VITALS — BP 125/81 | HR 96 | Temp 98.3°F | Resp 18 | Ht 71.0 in | Wt 195.5 lb

## 2022-07-20 DIAGNOSIS — I83812 Varicose veins of left lower extremities with pain: Secondary | ICD-10-CM | POA: Diagnosis not present

## 2022-07-20 DIAGNOSIS — I8393 Asymptomatic varicose veins of bilateral lower extremities: Secondary | ICD-10-CM

## 2022-07-20 NOTE — Progress Notes (Signed)
ASSESSMENT & PLAN   CHRONIC VENOUS INSUFFICIENCY: This patient has CEAP C2 venous disease (varicose veins).  Varicosities in the thigh are likely being fed by an incompetent anterior accessory saphenous vein.  He also has deep venous reflux in the common femoral vein.  We have discussed conservative measures.  Specifically, I have encouraged him to avoid prolonged sitting and standing.  We discussed the importance of exercise specifically walking and water aerobics.  I have recommended that he consider a thigh-high compression stocking that would cover the area of varicosities.  We also discussed the importance of daily leg elevation and the proper positioning for this.  If his symptoms progress I think we should try him on a thigh-high compression stocking with a gradient of 20 to 30 mmHg.  If this is not successful I think he would be a candidate for laser ablation of the left anterior accessory saphenous vein and stab phlebectomies.  He will call if his symptoms progress.  REASON FOR CONSULT:    Varicose vein left lower extremity.  The consult is requested by Gillian Shields, NP.   HPI:   Harry Nash is a 67 y.o. male who is referred with a large varicose vein of his left lower extremity.  The patient states that he has had a large varicosity in his left leg for some time but over the last year this is gradually increased in size.  He does describe some soreness associated with the vein.  His symptoms are worse with prolonged standing.  He denies any previous history of DVT.  He had no previous venous procedures.  He does not wear compression stockings.  He does not elevate his legs.  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  Past Medical History:  Diagnosis Date   Depression    Headache(784.0)    Kidney stones    TIA (transient ischemic attack)     Family History  Problem Relation Age of Onset   Heart disease Mother    Heart disease Father    Hypertension Father      SOCIAL HISTORY: Social History   Tobacco Use   Smoking status: Former    Types: Cigarettes    Quit date: 02/01/2003    Years since quitting: 19.4   Smokeless tobacco: Never  Substance Use Topics   Alcohol use: Yes    Alcohol/week: 1.0 standard drink of alcohol    Types: 1 Cans of beer per week    Comment: moderate    Allergies  Allergen Reactions   Gabapentin Other (See Comments)    Clouded mentation    Current Outpatient Medications  Medication Sig Dispense Refill   clopidogrel (PLAVIX) 75 MG tablet TAKE 1 TABLET (75 MG) BY MOUTH DAILY 90 tablet 1   Evolocumab (REPATHA SURECLICK) 140 MG/ML SOAJ Inject 140 mg into the skin every 14 (fourteen) days. 6 mL 3   LORazepam (ATIVAN) 0.5 MG tablet TAKE ONE TABLET TWICE DAILY AS NEEDED FOR ANXIETY 45 tablet 0   tiZANidine (ZANAFLEX) 4 MG tablet Take 1 tablet (4 mg total) by mouth every 8 (eight) hours as needed for muscle spasms. 60 tablet 1   tadalafil (CIALIS) 10 MG tablet Take 1 tablet (10 mg total) by mouth daily as needed for erectile dysfunction. 10 tablet 3   No current facility-administered medications for this visit.    REVIEW OF SYSTEMS:  [X]  denotes positive finding, [ ]  denotes negative finding Cardiac  Comments:  Chest pain  or chest pressure:    Shortness of breath upon exertion:    Short of breath when lying flat:    Irregular heart rhythm:        Vascular    Pain in calf, thigh, or hip brought on by ambulation:    Pain in feet at night that wakes you up from your sleep:     Blood clot in your veins:    Leg swelling:         Pulmonary    Oxygen at home:    Productive cough:     Wheezing:         Neurologic    Sudden weakness in arms or legs:     Sudden numbness in arms or legs:     Sudden onset of difficulty speaking or slurred speech:    Temporary loss of vision in one eye:     Problems with dizziness:         Gastrointestinal    Blood in stool:     Vomited blood:         Genitourinary     Burning when urinating:     Blood in urine:        Psychiatric    Major depression:         Hematologic    Bleeding problems:    Problems with blood clotting too easily:        Skin    Rashes or ulcers:        Constitutional    Fever or chills:    -  PHYSICAL EXAM:   Vitals:   07/20/22 1404  BP: 125/81  Pulse: 96  Resp: 18  Temp: 98.3 F (36.8 C)  TempSrc: Temporal  SpO2: 96%  Weight: 195 lb 8 oz (88.7 kg)  Height: 5\' 11"  (1.803 m)   Body mass index is 27.27 kg/m. GENERAL: The patient is a well-nourished male, in no acute distress. The vital signs are documented above. CARDIAC: There is a regular rate and rhythm.  VASCULAR: I do not detect carotid bruits. He has palpable pedal pulses. He has some large varicose veins in his left thigh as documented in the photograph below.   I did look at his superficial veins in the left leg myself with the Sono[employer].  Although he did not have reflux in the great saphenous vein I did identify an anterior accessory saphenous vein which did have significant reflux.  The anterior assessor's saphenous vein had diameters ranging from 4 to 5 mm.  PULMONARY: There is good air exchange bilaterally without wheezing or rales. ABDOMEN: Soft and non-tender with normal pitched bowel sounds.  MUSCULOSKELETAL: There are no major deformities. NEUROLOGIC: No focal weakness or paresthesias are detected. SKIN: There are no ulcers or rashes noted. PSYCHIATRIC: The patient has a normal affect.  DATA:    VENOUS DUPLEX: I have independently interpreted the venous duplex scan of the left lower extremity today.  There was no evidence of DVT.  There was deep venous reflux in the common femoral vein.  There was reflux at the saphenofemoral junction but otherwise no superficial venous reflux.    Waverly Ferrari [facility]

## 2022-07-21 ENCOUNTER — Encounter: Payer: Self-pay | Admitting: Rehabilitative and Restorative Service Providers"

## 2022-07-21 ENCOUNTER — Ambulatory Visit: Payer: 59 | Admitting: Rehabilitative and Restorative Service Providers"

## 2022-07-21 DIAGNOSIS — M6281 Muscle weakness (generalized): Secondary | ICD-10-CM | POA: Diagnosis not present

## 2022-07-21 DIAGNOSIS — M25572 Pain in left ankle and joints of left foot: Secondary | ICD-10-CM | POA: Diagnosis not present

## 2022-07-21 DIAGNOSIS — M546 Pain in thoracic spine: Secondary | ICD-10-CM | POA: Diagnosis not present

## 2022-07-21 DIAGNOSIS — M25571 Pain in right ankle and joints of right foot: Secondary | ICD-10-CM

## 2022-07-21 DIAGNOSIS — R262 Difficulty in walking, not elsewhere classified: Secondary | ICD-10-CM

## 2022-07-21 NOTE — Therapy (Addendum)
OUTPATIENT PHYSICAL THERAPY TREATMENT  /DISCHARGE   Patient Name: Harry Nash MRN: [id number] DOB:11/05/1955, 67 y.o., male Today's Date: 07/21/2022  END OF SESSION:  PT End of Session - 07/21/22 1423     Visit Number 3    Number of Visits 20    Date for PT Re-Evaluation 09/22/22    Authorization Type [employer] 10% coinsurance    Authorization - Number of Visits 59    Progress Note Due on Visit 10    PT Start Time 1430    PT Stop Time 1509    PT Time Calculation (min) 39 min    Activity Tolerance Patient tolerated treatment well    Behavior During Therapy WFL for tasks assessed/performed               Past Medical History:  Diagnosis Date   Depression    Headache(784.0)    Kidney stones    TIA (transient ischemic attack)    Past Surgical History:  Procedure Laterality Date   HERNIA REPAIR     ROTATOR CUFF REPAIR     Patient Active Problem List   Diagnosis Date Noted   Dysuria 03/04/2022   Need for shingles vaccine 09/28/2021   Vit B12 defic anemia d/t slctv vit B12 malabsorp w protein 08/30/2021   Lumbar paraspinal muscle spasm 04/27/2021   Myalgia due to statin 02/22/2021   Nocturia 02/22/2021   History of TIA (transient ischemic attack) 07/20/2020   Left rotator cuff tear 02/21/2020   Rotator cuff tear, right 08/29/2019   Cervical spinal stenosis 05/13/2019   Family history of heart disease 03/06/2019   Biceps tendon tear 04/03/2018   Neuroma of third interspace of left foot 02/02/2018   Diverticulitis of colon 11/08/2016   Adrenal incidentaloma (HCC) 11/08/2016   Tarsal tunnel syndrome of left side 11/02/2015   Erectile dysfunction 07/23/2015   Plantar fasciitis of left foot 04/08/2015   Back pain 04/08/2015   Degenerative disc disease, cervical 04/08/2015   Nonallopathic lesion of lumbosacral region 04/08/2015   Nonallopathic lesion-rib cage 04/08/2015   Nonallopathic lesion of cervical region 04/08/2015   Tibialis posterior tendon tear,  nontraumatic 02/18/2015   Routine general medical examination at a health care facility 02/10/2012   Adjustment reaction with anxiety and depression 11/11/2011   Prostate cancer screening 09/20/2010   HYPERCHOLESTEROLEMIA 08/19/2009   COLONIC POLYPS, ADENOMATOUS 08/12/2009    PCP: McDiarmid, Todd MD  REFERRING PROVIDER: Rodolph Bong, MD  REFERRING DIAG: [phone] (ICD-10-CM) - Pain of right heel M25.511 (ICD-10-CM) - Acute pain of right shoulder  THERAPY DIAG:  Pain in thoracic spine  Pain in right ankle and joints of right foot  Pain in left ankle and joints of left foot  Muscle weakness (generalized)  Difficulty in walking, not elsewhere classified  Rationale for Evaluation and Treatment: Rehabilitation  ONSET DATE: 06/2022  SUBJECTIVE:  SUBJECTIVE STATEMENT: Pt indicated walking a little easier but still having foot back.  Reported back improved some.  No pain at rest.   PERTINENT HISTORY: Reported history of foot pain in past with injection.   PAIN:  NPRS scale: no pain upon arrival.  Pain location: bilateral feet, Rt posterior shoulder/thoracic Pain description: feet:  burning.   Rt shoulder/thoracic:  spasm Aggravating factors: morning complaints, worse in mornings and early walking (for feet), movement after prolonged sitting.  Relieving factors: stretching for feet, OTC medicine (doesn't help much)  PRECAUTIONS: None  WEIGHT BEARING RESTRICTIONS: No  FALLS:  Has patient fallen in last 6 months? No  LIVING ENVIRONMENT: Lives in: House/apartment Stairs: 2 story house with bedroom on 2nd floor.   OCCUPATION: Just retired.  PLOF: Independent, walking dog,   PATIENT GOALS:Reduce pain, walk better.    OBJECTIVE:   PATIENT SURVEYS:  07/14/2022 FOTO intake:   52  predicted:   65  COGNITION: 07/14/2022 Overall cognitive status: WFL     SENSATION: 07/14/2022 WFL  POSTURE: 07/14/2022 Rounder shoulders, mild forward head posture  ROM:  07/14/2022:   Cervical AROM WFL s symptoms.  Thoracic AROM WFL with symptoms noted in Rt posterior shoulder/thoracic region c Rt rotation.   Prone thoracic cPA WFL throughout with pain complaints.    ROM Right 07/14/2022 Left 07/14/2022  Shoulder flexion [facility]  Shoulder extension    Shoulder abduction    Shoulder adduction    Shoulder internal rotation    Shoulder external rotation    Elbow flexion    Elbow extension        Ankle DF 9 AROM in seated knee flexion 90 deg 8 AROM in seated knee flexion 90 deg  Ankle PF 58 AROM in seated knee flexion 90 deg 50 AROM in seated knee flexion 90 deg  Ankle inversion    Ankle eversion        (Blank rows = not tested)  MMT:  MMT Right 07/14/2022 Left 07/14/2022  Shoulder flexion 5/5 5/5  Shoulder extension    Shoulder abduction 5/5 5/5  Shoulder adduction    Shoulder internal rotation 5/5 5/5  Shoulder external rotation 4+/5 5/5  Middle trapezius    Lower trapezius    Elbow flexion 5/5 5/5  Elbow extension 5/5 5/5      Ankle DF 5/5 5/5  Ankle PF 4/5  10 reps heel raise SL in standing  4/5 12 reps heel raise SL in standing  Ankle inversion 5/5 5/5  Ankle eversion 5/5 5/5          (Blank rows = not tested)  SPECIAL TESTS: 07/14/2022 (+) windlass bilateral.   (-) shrug, drop arm bilateral   FUNCTIONAL TESTING; 07/14/2022: Lt SLS: 8 seconds  Rt SLS: 8 second c increased aberrant movement  PALPATION:  07/21/2022:  Trigger points in thoracic similar to eval.  Reduced complaints in lateral and medial gastroc on Lt but still noted on lateral mild.  Rt lateral similar to eval.   07/14/2022 Trigger points c concordant symptoms in Rt mid thoracic C4-C7 paraspinals, rhomboids.  Trigger points throughout medial and lateral gastroc heads  bilateral  GAIT 07/13/2012 Reduced toe of progression , noted more in Rt.  TODAY'S TREATMENT:                                                                                                       DATE: 07/21/2022 Therex: Prone scapular retraction 5 sec hold x 10 Prone scapular retraction c GH ext hold 5 sec x  10  Prone horizontal abduction off table c scapular retraction 5 sec  hold x 10  UBE fwd/back 3 mins each way lvl 2.5 with :30 sec rest between  Manual Prone cPA, regional PA mid thoracic G3.  Compression with percussive device STM to bilateral calf musculature.  Skilled palpation with dry needling.   Moist heat to thoracic region post dry needling, during percussive device manual.   Trigger Point Dry-Needling  Treatment instructions: Expect mild to moderate muscle soreness. S/S of pneumothorax if dry needled over a lung field, and to seek immediate medical attention should they occur. Patient verbalized understanding of these instructions and education.  Patient Consent Given: Yes Education handout provided: Previously provided Muscles treated: Rt T5-T7 multifidi Treatment response/outcome: twitch response c concordant symptom  TODAY'S TREATMENT:                                                                                                       DATE: 07/19/2022 Therex:  Incline gastroc stretch standing bilateral 30 sec x 5  Standing double leg PF with single leg eccentrics x 10 bilateral  Review of existing HEP verbally   Manual Compression with percussive device STM to bilateral calf musculature.  Skilled palpation with dry needling.  Trigger Point Dry-Needling  Treatment instructions: Expect mild to moderate muscle soreness. S/S of pneumothorax if dry needled over a lung field, and  to seek immediate medical attention should they occur. Patient verbalized understanding of these instructions and education.  Patient Consent Given: Yes Education handout provided: Previously provided Muscles treated: Rt T5-T7 multifidi Treatment response/outcome: twitch response c concordant symptom    TODAY'S TREATMENT:                                                                                                       DATE: 07/14/2022 Therex:    HEP instruction/performance c cues for techniques, handout provided.  Trial set performed  of each for comprehension and symptom assessment.  See below for exercise list   PATIENT EDUCATION: 07/21/2022 Education details:  HEP update Person educated: Patient Education method: Explanation, Demonstration, Verbal cues, and Handouts Education comprehension: verbalized understanding, returned demonstration, and verbal cues required  HOME EXERCISE PROGRAM: Access Code: [email or web address] URL: [email or web address] Date: 07/21/2022 Prepared by: Chyrel Masson  Exercises - Shoulder External Rotation and Scapular Retraction  - 3-5 x daily - 7 x weekly - 1 sets - 5-10 reps - 2 hold - Seated 55 Gallon Barrel Hug Stretch  - 3-5 x daily - 7 x weekly - 1 sets - 5-10 reps - 2 hold - Standing Bilateral Gastroc Stretch with Step  - 2-3 x daily - 7 x weekly - 1 sets - 5 reps - 30 hold - Standing Dorsiflexion Self-Mobilization on Step  - 2-3 x daily - 7 x weekly - 1 sets - 10 reps - 2-3 hold - Prone Scapular Retraction  - 1 x daily - 7 x weekly - 1 sets - 10 reps - 5 hold - Prone Scapular Slide with Shoulder Extension  - 1 x daily - 7 x weekly - 1 sets - 10 reps - 5 hold - Prone Scapular Retraction Arms at Side  - 1 x daily - 7 x weekly - 1 sets - 10 reps - 5 hold  ASSESSMENT:  CLINICAL IMPRESSION: Reduced tenderness noted in Lt calf today compared to last visit.  Rt calf similar laterally.   Repeated thoracic manual/dry needling per response from  last visit.  Scapular control weakness noted in prone activity, added to HEP to develop.   OBJECTIVE IMPAIRMENTS: Abnormal gait, decreased activity tolerance, decreased balance, decreased coordination, decreased endurance, decreased mobility, difficulty walking, decreased ROM, decreased strength, hypomobility, increased fascial restrictions, impaired perceived functional ability, increased muscle spasms, impaired flexibility, impaired UE functional use, improper body mechanics, postural dysfunction, and pain.   ACTIVITY LIMITATIONS: carrying, lifting, bending, standing, stairs, reach over head, and locomotion level  PARTICIPATION LIMITATIONS: interpersonal relationship and community activity  PERSONAL FACTORS:  Multiple treatment areas  are also affecting patient's functional outcome.   REHAB POTENTIAL: Good  CLINICAL DECISION MAKING: Stable/uncomplicated  EVALUATION COMPLEXITY: Low   GOALS: Goals reviewed with patient? Yes  SHORT TERM GOALS: (target date for Short term goals are 3 weeks 08/04/2022)  1.Patient will demonstrate independent use of home exercise program to maintain progress from in clinic treatments. Goal status: on going 07/19/2022  LONG TERM GOALS: (target dates for all long term goals are 10 weeks  09/22/2022 )   1. Patient will demonstrate/report pain at worst less than or equal to 2/10 to facilitate minimal limitation in daily activity secondary to pain symptoms. Goal status: New   2. Patient will demonstrate independent use of home exercise program to facilitate ability to maintain/progress functional gains from skilled physical therapy services. Goal status: New   3. Patient will demonstrate FOTO outcome > or = 65 % to indicate reduced disability due to condition. Goal status: New   4.  Patient will demonstrate Rt UE MMT 5/5 throughout to facilitate lifting, reaching, carrying at [facility] in daily activity.   Goal status: New   5.  Patient will demonstrate  thoracic AROM s symptoms for usual mobility in daily life.    Goal status: New   6.  Patient will demonstrate Rt PF 5/5 s symptoms to facilitate usual walking, standing activity.  Goal status: New   7.  Patient will demonstrate bilateral DF > 10  deg to facilitate toe off progression in ambulation.  Goal Status: New  PLAN:  PT FREQUENCY: 1-2x/week  PT DURATION: 10 weeks  PLANNED INTERVENTIONS: Therapeutic exercises, Therapeutic activity, Neuro Muscular re-education, Balance training, Gait training, Patient/Family education, Joint mobilization, Stair training, DME instructions, Dry Needling, Electrical stimulation, Traction, Cryotherapy, vasopneumatic device Moist heat, Taping, Ultrasound, Ionotophoresis 4mg /ml Dexamethasone, and aquatic therapy ,Manual therapy.  All included unless contraindicated  PLAN FOR NEXT SESSION: Dry needling as desired.  Recheck ROMs.    Chyrel Masson, PT, DPT, OCS, ATC 07/21/22  3:06 PM   PHYSICAL THERAPY DISCHARGE SUMMARY  Visits from Start of Care: 3  Current functional level related to goals / functional outcomes: See note   Remaining deficits: See note   Education / Equipment: HEP  Patient goals were partially met. Patient is being discharged due to being pleased with the current functional level.  Chyrel Masson, PT, DPT, OCS, ATC 08/16/22  10:30 AM

## 2022-07-22 ENCOUNTER — Other Ambulatory Visit: Payer: Self-pay | Admitting: Family Medicine

## 2022-07-22 DIAGNOSIS — F4323 Adjustment disorder with mixed anxiety and depressed mood: Secondary | ICD-10-CM

## 2022-07-26 ENCOUNTER — Encounter: Payer: 59 | Admitting: Rehabilitative and Restorative Service Providers"

## 2022-07-28 ENCOUNTER — Encounter: Payer: 59 | Admitting: Physical Therapy

## 2022-08-01 ENCOUNTER — Encounter: Payer: 59 | Admitting: Rehabilitative and Restorative Service Providers"

## 2022-08-03 ENCOUNTER — Encounter: Payer: 59 | Admitting: Physical Therapy

## 2022-08-09 ENCOUNTER — Encounter: Payer: 59 | Admitting: Rehabilitative and Restorative Service Providers"

## 2022-08-23 ENCOUNTER — Encounter: Payer: 59 | Admitting: Rehabilitative and Restorative Service Providers"

## 2022-09-15 ENCOUNTER — Other Ambulatory Visit: Payer: Self-pay | Admitting: Family Medicine

## 2022-09-15 DIAGNOSIS — F4323 Adjustment disorder with mixed anxiety and depressed mood: Secondary | ICD-10-CM

## 2022-09-30 ENCOUNTER — Other Ambulatory Visit: Payer: Self-pay

## 2022-10-03 MED ORDER — CLOPIDOGREL BISULFATE 75 MG PO TABS: ORAL_TABLET | ORAL | 0 refills | Status: DC

## 2022-10-04 NOTE — Telephone Encounter (Signed)
Called and scheduled patient

## 2022-10-10 ENCOUNTER — Ambulatory Visit (INDEPENDENT_AMBULATORY_CARE_PROVIDER_SITE_OTHER): Payer: 59 | Admitting: Family Medicine

## 2022-10-10 ENCOUNTER — Encounter: Payer: Self-pay | Admitting: Family Medicine

## 2022-10-10 VITALS — BP 134/86 | HR 91 | Ht 71.0 in | Wt 195.0 lb

## 2022-10-10 DIAGNOSIS — Z8673 Personal history of transient ischemic attack (TIA), and cerebral infarction without residual deficits: Secondary | ICD-10-CM

## 2022-10-10 DIAGNOSIS — T466X5A Adverse effect of antihyperlipidemic and antiarteriosclerotic drugs, initial encounter: Secondary | ICD-10-CM

## 2022-10-10 DIAGNOSIS — E7800 Pure hypercholesterolemia, unspecified: Secondary | ICD-10-CM | POA: Diagnosis not present

## 2022-10-10 DIAGNOSIS — R002 Palpitations: Secondary | ICD-10-CM

## 2022-10-10 DIAGNOSIS — Z23 Encounter for immunization: Secondary | ICD-10-CM

## 2022-10-10 DIAGNOSIS — R351 Nocturia: Secondary | ICD-10-CM

## 2022-10-10 DIAGNOSIS — Z125 Encounter for screening for malignant neoplasm of prostate: Secondary | ICD-10-CM | POA: Diagnosis not present

## 2022-10-10 DIAGNOSIS — M791 Myalgia, unspecified site: Secondary | ICD-10-CM

## 2022-10-10 DIAGNOSIS — K219 Gastro-esophageal reflux disease without esophagitis: Secondary | ICD-10-CM

## 2022-10-10 DIAGNOSIS — D511 Vitamin B12 deficiency anemia due to selective vitamin B12 malabsorption with proteinuria: Secondary | ICD-10-CM

## 2022-10-10 DIAGNOSIS — F4323 Adjustment disorder with mixed anxiety and depressed mood: Secondary | ICD-10-CM

## 2022-10-10 LAB — PSA: PSA: 0.6

## 2022-10-10 MED ORDER — TAMSULOSIN HCL 0.4 MG PO CAPS: 0.4000 mg | ORAL_CAPSULE | Freq: Every day | ORAL | 1 refills | Status: DC

## 2022-10-10 NOTE — Patient Instructions (Signed)
It was great to see you!  Our plans for today:  - Try famotidine (pepcid) 20mg  daily for your acid reflux.  - We are starting a new medicine for your prostate to help with urination.  - Come back in 2 months.  We are checking some labs today, we will release these results to your MyChart.  Take care and seek immediate care sooner if you develop any concerns.   Dr. Linwood Dibbles

## 2022-10-10 NOTE — Progress Notes (Unsigned)
    SUBJECTIVE:   CHIEF COMPLAINT / HPI:   HLD, h/o TIA - medications: repatha. Muscle spasms with crestor.  - compliance: good - medication SEs: none  Anxiety - ativan prn. Only taking when having a hard time calming down, about a few times per month.   Palpitations - noticing 1-2 months heart racing almost every day. 15-20 minutes at a time. Gets alert on watch. Does not notice abnormal heart rhythms. Currently asymptomatic. No associated chest pain, SOB, nausea.  Frequent throat clearing - noticing more with acidic/salty foods/drinks. Sometimes feels like has difficulty swallowing because of this, food does not get stuck, no regurgitation of food. Worse at night and with laying down. With occasional dry cough with this. Has not tried any meds.   Difficulty urinating - Nocturia 2x per night. Weak stream. Some dribbling. No hematuria or dysuria. No personal or family h/o prostate cancer.  Health Maintenance - needs colonoscopy, shingles vaccine, Tdap.   OBJECTIVE:   BP 134/86   Pulse 91   Ht 5\' 11"  (1.803 m)   Wt 195 lb (88.5 kg)   SpO2 98%   BMI 27.20 kg/m   Gen: well appearing, in NAD HEENT: orophyarynx clear without exudate or erythema. Uvula midline. No tonsillar enlargement. Good dentition. No cervical or supraclavicular lymphadenopathy. No thyromegaly. Trachea midline. Card: RRR Lungs: CTAB Ext: WWP, no edema   ASSESSMENT/PLAN:   Vit B12 defic anemia d/t slctv vit B12 malabsorp w protein H/o IM injections, has not had >1 year. Recheck levels. Asymptomatic.  Nocturia Trial tamsulosin. Check PSA.  HYPERCHOLESTEROLEMIA Continue repatha. Check lipid panel. Discussed unlikely to come off repatha given statin intolerance and h/o TIA, needs secondary prevention.  Adjustment reaction with anxiety and depression Appropriate use of ativan, continue. PDMP reviewed, no red flags.  GERD (gastroesophageal reflux disease) Trial famotidine.   Palpitations New problem.  Currently asymptomatic. RRR on exam. Get labs to assess for contributors. F/u 2 months for annual exam, if persistent on f/u with otherwise negative work up, will obtain Zio monitor.   F/u 2 months.  Caro Laroche, DO

## 2022-10-11 DIAGNOSIS — R002 Palpitations: Secondary | ICD-10-CM | POA: Insufficient documentation

## 2022-10-11 DIAGNOSIS — K219 Gastro-esophageal reflux disease without esophagitis: Secondary | ICD-10-CM | POA: Insufficient documentation

## 2022-10-11 LAB — BASIC METABOLIC PANEL
BUN/Creatinine Ratio: 15 (ref 10–24)
BUN: 13 mg/dL (ref 8–27)
CO2: 22 mmol/L (ref 20–29)
Calcium: 9.3 mg/dL (ref 8.6–10.2)
Chloride: 106 mmol/L (ref 96–106)
Creatinine, Ser: 0.85 mg/dL (ref 0.76–1.27)
Glucose: 81 mg/dL (ref 70–99)
Potassium: 4.1 mmol/L (ref 3.5–5.2)
Sodium: 141 mmol/L (ref 134–144)
eGFR: 95 mL/min/{1.73_m2} (ref >59)

## 2022-10-11 LAB — LIPID PANEL
Chol/HDL Ratio: 2.7 ratio (ref 0.0–5.0)
Cholesterol, Total: 145 mg/dL (ref 100–199)
HDL: 54 mg/dL (ref >39)
LDL Chol Calc (NIH): 65 mg/dL (ref 0–99)
Triglycerides: 156 mg/dL — ABNORMAL HIGH (ref 0–149)
VLDL Cholesterol Cal: 26 mg/dL (ref 5–40)

## 2022-10-11 LAB — CBC
Hematocrit: 44 % (ref 37.5–51.0)
Hemoglobin: 14.3 g/dL (ref 13.0–17.7)
MCH: 28.3 pg (ref 26.6–33.0)
MCHC: 32.5 g/dL (ref 31.5–35.7)
MCV: 87 fL (ref 79–97)
Platelets: 213 10*3/uL (ref 150–450)
RBC: 5.06 x10E6/uL (ref 4.14–5.80)
RDW: 12.5 % (ref 11.6–15.4)
WBC: 6.5 10*3/uL (ref 3.4–10.8)

## 2022-10-11 LAB — VITAMIN B12: Vitamin B-12: 1282 pg/mL — ABNORMAL HIGH (ref 232–1245)

## 2022-10-11 LAB — TSH: TSH: 1.06 u[IU]/mL (ref 0.450–4.500)

## 2022-10-11 LAB — PSA: Prostate Specific Ag, Serum: 0.6 ng/mL (ref 0.0–4.0)

## 2022-10-11 NOTE — Assessment & Plan Note (Signed)
Trial tamsulosin. Check PSA.

## 2022-10-11 NOTE — Assessment & Plan Note (Signed)
Trial famotidine.

## 2022-10-11 NOTE — Assessment & Plan Note (Signed)
Continue repatha. Check lipid panel. Discussed unlikely to come off repatha given statin intolerance and h/o TIA, needs secondary prevention.

## 2022-10-11 NOTE — Assessment & Plan Note (Signed)
New problem. Currently asymptomatic. RRR on exam. Get labs to assess for contributors. F/u 2 months for annual exam, if persistent on f/u with otherwise negative work up, will obtain Zio monitor.

## 2022-10-11 NOTE — Assessment & Plan Note (Signed)
Appropriate use of ativan, continue. PDMP reviewed, no red flags.

## 2022-10-11 NOTE — Assessment & Plan Note (Signed)
H/o IM injections, has not had >1 year. Recheck levels. Asymptomatic.

## 2022-10-17 ENCOUNTER — Ambulatory Visit: Payer: 59 | Attending: Student | Admitting: Student

## 2022-10-17 ENCOUNTER — Encounter: Payer: Self-pay | Admitting: Family Medicine

## 2022-10-17 ENCOUNTER — Other Ambulatory Visit: Payer: Self-pay | Admitting: Student

## 2022-10-17 ENCOUNTER — Encounter: Payer: Self-pay | Admitting: Student

## 2022-10-17 ENCOUNTER — Telehealth (HOSPITAL_BASED_OUTPATIENT_CLINIC_OR_DEPARTMENT_OTHER): Payer: Self-pay | Admitting: Cardiology

## 2022-10-17 VITALS — BP 120/80 | HR 89 | Ht 71.0 in | Wt 196.0 lb

## 2022-10-17 DIAGNOSIS — Z8673 Personal history of transient ischemic attack (TIA), and cerebral infarction without residual deficits: Secondary | ICD-10-CM

## 2022-10-17 DIAGNOSIS — R002 Palpitations: Secondary | ICD-10-CM | POA: Diagnosis not present

## 2022-10-17 DIAGNOSIS — R55 Syncope and collapse: Secondary | ICD-10-CM | POA: Diagnosis not present

## 2022-10-17 DIAGNOSIS — R0609 Other forms of dyspnea: Secondary | ICD-10-CM

## 2022-10-17 DIAGNOSIS — Z7189 Other specified counseling: Secondary | ICD-10-CM

## 2022-10-17 DIAGNOSIS — R42 Dizziness and giddiness: Secondary | ICD-10-CM | POA: Diagnosis not present

## 2022-10-17 DIAGNOSIS — E785 Hyperlipidemia, unspecified: Secondary | ICD-10-CM

## 2022-10-17 DIAGNOSIS — R000 Tachycardia, unspecified: Secondary | ICD-10-CM

## 2022-10-17 MED ORDER — METOPROLOL TARTRATE 25 MG PO TABS: 12.5000 mg | ORAL_TABLET | Freq: Two times a day (BID) | ORAL | 3 refills | Status: DC | PRN

## 2022-10-17 NOTE — Telephone Encounter (Signed)
  Per MyChart scheduling message:  Initial complaint:  I am experiencing periodic rapid heartbeat while at rest. Around 120. I feel like I need to get this checked out.   STAT if HR is under 50 or over 120 (normal HR is 60-100 beats per minute)  What is your heart rate?   Do you have a log of your heart rate readings (document readings)?   Do you have any other symptoms?     When elevated, my heart rate is 115-130 A couple of nights ago, I fainted when I got up to use the restroom in the middle of the night.  I have experienced the elevated rate for about two months.  I have some ECG readings on my IWatch. I don't know if that records heart rate.

## 2022-10-17 NOTE — Patient Instructions (Signed)
Medication Instructions:  START METOPROLOL TARTRATE 12.5 MG TWICE A DAY AS NEEDED FOR HEART RATE > 100.  *If you need a refill on your cardiac medications before your next appointment, please call your pharmacy*   Lab Work: NO LABS If you have labs (blood work) drawn today and your tests are completely normal, you will receive your results only by: MyChart Message (if you have MyChart) OR A paper copy in the mail If you have any lab test that is abnormal or we need to change your treatment, we will call you to review the results.   Testing/Procedures:[address] Your physician has requested that you have a carotid duplex. This test is an ultrasound of the carotid arteries in your neck. It looks at blood flow through these arteries that supply the brain with blood. Allow one hour for this exam. There are no restrictions or special instructions.  Your physician has requested that you have an echocardiogram. Echocardiography is a painless test that uses sound waves to create images of your heart. It provides your doctor with information about the size and shape of your heart and how well your heart's chambers and valves are working. This procedure takes approximately one hour. There are no restrictions for this procedure. Please do NOT wear cologne, perfume, aftershave, or lotions (deodorant is allowed). Please arrive 15 minutes prior to your appointment time.[address]  30 DAY CARDIAC EVENT MONITOR INSTRUCTIONS BELOW.  Follow-Up: At [facility], you and your health needs are our priority.  As part of our continuing mission to provide you with exceptional heart care, we have created designated Provider Care Teams.  These Care Teams include your primary Cardiologist (physician) and Advanced Practice Providers (APPs -  Physician Assistants and Nurse Practitioners) who all work together to provide you with the care you need, when you need it.   Your next  appointment:   8-12 week(s)  Provider:   Jodelle Red, MD  OR Arcelia Jew  Other Instructions [employer] Cardiac Event Monitor Instructions  Your physician has requested you wear your cardiac event monitor for _____ days, (1-30). [employer] may call or text to confirm a shipping address. The monitor will be sent to a land address via UPS. [employer] will not ship a monitor to a PO BOX. It typically takes 3-5 days to receive your monitor after it has been enrolled. [employer] will assist with USPS tracking if your package is delayed. The telephone number for [employer] is [phone]. Once you have received your monitor, please review the enclosed instructions. Instruction tutorials can also be viewed under help and settings on the enclosed cell phone. Your monitor has already been registered assigning a specific monitor serial # to you.  Billing and Self Pay Discount Information  [employer] has been provided the insurance information we had on file for you.  If your insurance has been updated, please call [employer] at [phone] to provide them with your updated insurance information.   [employer] offers a discounted Self Pay option for patients who have insurance that does not cover their cardiac event monitor or patients without insurance.  The discounted cost of a Self Pay Cardiac Event Monitor would be $225.00 , if the patient contacts [employer] at [phone] within 7 days of applying the monitor to make payment arrangements.  If the patient does not contact [employer] within 7 days of applying the monitor, the cost of the cardiac event monitor will be $350.00.  Applying the monitor  Remove cell phone from case and turn it on. The cell phone works as IT consultant and needs to be within [employer] of you at all times. The cell phone will need to be charged on a daily basis. We recommend you plug the cell phone into the enclosed charger at your bedside table  every night.  Monitor batteries: You will receive two monitor batteries labelled #1 and #2. These are your recorders. Plug battery #2 onto the second connection on the enclosed charger. Keep one battery on the charger at all times. This will keep the monitor battery deactivated. It will also keep it fully charged for when you need to switch your monitor batteries. A small light will be blinking on the battery emblem when it is charging. The light on the battery emblem will remain on when the battery is fully charged.  Open package of a Monitor strip. Insert battery #1 into black hood on strip and gently squeeze monitor battery onto connection as indicated in instruction booklet. Set aside while preparing skin.  Choose location for your strip, vertical or horizontal, as indicated in the instruction booklet. Shave to remove all hair from location. There cannot be any lotions, oils, powders, or colognes on skin where monitor is to be applied. Wipe skin clean with enclosed Saline wipe. Dry skin completely.  Peel paper labeled #1 off the back of the Monitor strip exposing the adhesive. Place the monitor on the chest in the vertical or horizontal position shown in the instruction booklet. One arrow on the monitor strip must be pointing upward. Carefully remove paper labeled #2, attaching remainder of strip to your skin. Try not to create any folds or wrinkles in the strip as you apply it.  Firmly press and release the circle in the center of the monitor battery. You will hear a small beep. This is turning the monitor battery on. The heart emblem on the monitor battery will light up every 5 seconds if the monitor battery in turned on and connected to the patient securely. Do not push and hold the circle down as this turns the monitor battery off. The cell phone will locate the monitor battery. A screen will appear on the cell phone checking the connection of your monitor strip. This may read poor  connection initially but change to good connection within the next minute. Once your monitor accepts the connection you will hear a series of 3 beeps followed by a climbing crescendo of beeps. A screen will appear on the cell phone showing the two monitor strip placement options. Touch the picture that demonstrates where you applied the monitor strip.  Your monitor strip and battery are waterproof. You are able to shower, bathe, or swim with the monitor on. They just ask you do not submerge deeper than 3 feet underwater. We recommend removing the monitor if you are swimming in a lake, river, or ocean.  Your monitor battery will need to be switched to a fully charged monitor battery approximately once a week. The cell phone will alert you of an action which needs to be made.  On the cell phone, tap for details to reveal connection status, monitor battery status, and cell phone battery status. The green dots indicates your monitor is in good status. A red dot indicates there is something that needs your attention.  To record a symptom, click the circle on the monitor battery. In 30-60 seconds a list of symptoms will appear on the cell phone. Select your symptom  and tap save. Your monitor will record a sustained or significant arrhythmia regardless of you clicking the button. Some patients do not feel the heart rhythm irregularities. [employer] will notify us of any serious or critical events.  Refer to instruction booklet for instructions on switching batteries, changing strips, the Do not disturb or Pause features, or any additional questions.  Call [employer] at [phone], to confirm your monitor is transmitting and record your baseline. They will answer any questions you may have regarding the monitor instructions at that time.  Returning the monitor to [employer]  Place all equipment back into blue box. Peel off strip of paper to expose adhesive and close box securely. There is a  prepaid UPS shipping label on this box. Drop in a UPS drop box, or at a UPS facility like [employer]. You may also contact [employer] to arrange UPS to pick up monitor package at your home.

## 2022-10-17 NOTE — Progress Notes (Signed)
Cardiology Clinic Note   Date: 10/17/2022 ID: [name], DOB [birth year], MRN [id number]  Primary Cardiologist:  Harry Red, MD  Patient Profile    Harry Nash is a 67 y.o. male who presents to the clinic today for concerns of palpitations and syncope.     Past medical history significant for: Palpitations.  Hypercholesterolemia. Lipid panel 10/10/2022: LDL 65, HDL 54, TG 156, total 145. GERD.  TIA.  Echo 05/08/2019: EF 60 to 65%.  No RWMA.  Mild LVH.  Grade I DD.  Normal RV function.  Normal PA pressure.  Bubble study negative for significant atrial shunt however unable to exclude small late intrahepatic/intrapulmonary shunt.  Trivial MR/AI.  Aortic valve sclerosis without stenosis.     History of Present Illness    Harry Nash was first evaluated by Dr. Cristal Nash on 02/25/2019 for cardiovascular risk management at the request of Dr. Leveda Nash.  Patient with strong family history of early CAD.  Father died of MI at age 40.  Mother had four-vessel CABG in her 83s and lived to be 22.  Oldest brother had valve replacement and multiple bypasses in his late 43s.  Next brother had MI with stent placement.  Oldest sister with unknown heart history.  Next brother had two-vessel bypass/mitral valve repair at age 84.  Neck sister with heart racing now on medication.  2 siblings with unknown history.  On 05/07/2019 patient had a TIA lasting 30 to 40 minutes.  Symptoms began with dizziness that lasted for about 15 seconds followed by loss of sensation at the left leg.  Echo showed normal LV/RV function with bubble study negative for atrial shunt but unable to exclude small late intrahepatics/intrapulmonary shunt.  He was taken off aspirin and start Plavix.  Patient contacted the office today with complaints of elevated heart rate.  Per his report "When elevated, my heart rate is 115-130. A couple of nights ago, I fainted when I got up to use the restroom in the middle of the night.  I have experienced the elevated rate for about two months. I have some ECG readings on my IWatch. I don't know if that records heart rate."  Today, patient is here alone. He reports an episode 2 nights ago of waking up to use the rest room and developing leg spasms upon standing. He was able to get to the bathroom without difficulty. He urinated and as he was leaving the bathroom he became diaphoretic and began hyperventilating he became dizzy and fell. He does not think he went out completely, as he heard his wife ask him what was wrong and told her he was on the floor. He had a similar episodes 6-8 months ago but passed out completely for a few seconds. Patient also reports a 2 month history of elevated heart rate going into the 120s. He has a smart watch and is able to monitor his heart rate and rhythm. Yesterday his heart rate was between 115-120 nearly all day. He tried taking an ativan but it did not bring down his heart rate. He denies shortness of breath with episodes of heart racing. He is active riding his bike, using the elliptical and lifting weights 3-4 times a week without any shortness of breath or chest pain. However, he does feel recently he has been getting dyspneic with minimal activities such as washing the dishes or walking up the stairs. 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 Discussed [place] law of no  driving for 6 months after syncopal event. He verbalized understanding.     ROS: All other systems reviewed and are otherwise negative except as noted in History of Present Illness.  Studies Reviewed    EKG Interpretation Date/Time:  Monday October 17 2022 14:17:22 EDT Ventricular Rate:  89 PR Interval:  134 QRS Duration:  84 QT Interval:  370 QTC Calculation: 450 R Axis:   6  Text Interpretation: Sinus rhythm with occasional Premature ventricular complexes When compared with ECG of 07-May-2019 14:41, Premature ventricular complexes are now Present Confirmed  by Harry Nash [phone]) on 10/17/2022 2:31:01 PM        Physical Exam    VS:  BP 120/80 (BP Location: Left Arm, Patient Position: Sitting, Cuff Size: Normal)   Pulse 89   Ht 5\' 11"  (1.803 m)   Wt 196 lb (88.9 kg)   SpO2 97%   BMI 27.34 kg/m  , BMI Body mass index is 27.34 kg/m.  GEN: Well nourished, well developed, in no acute distress. Neck: No JVD or carotid bruits. Cardiac:  RRR. No murmurs. No rubs or gallops.   Respiratory:  Respirations regular and unlabored. Clear to auscultation without rales, wheezing or rhonchi. GI: Soft, nontender, nondistended. Extremities: Radials/DP/PT 2+ and equal bilaterally. No clubbing or cyanosis. No edema.  Skin: Warm and dry, no rash. Neuro: Strength intact.  Assessment & Plan    Palpitations. Patient reports a 2 month history of feeling his heart racing. He has been monitoring the rate with a smart watch and it can get up into the 120s at rest. One day ago it lasted nearly all day fluctuating between 115-120 bpm. He tried taking an ativan but it did not bring down his rate. He denies associated shortness of breath. EKG today shows NSR with occasional PVCs. Will get a 30 day event monitor (see #2). Patient with recent lab work to include CBC, CMP and TSH which were all within normal limits. Will give metoprolol tartrate 12.5 mg bid prn sustained heart rate >100.  Near syncope/syncope/dizziness. Patient describes an event 2 nights ago of  waking up to use the rest room and developing leg spasms upon standing. He was able to get to the bathroom without difficulty. He urinated and as he was leaving the bathroom he became diaphoretic and began hyperventilating he became dizzy and fell. He does not think he went out completely, as he heard his wife ask him what was wrong and told her he was on the floor. He had a similar episodes 6-8 months ago but passed out completely for a few seconds. He denies positional dizziness. Discussed  law of no  driving for 6 months after syncopal event. Patient expressed understanding. Will get a 30 day cardiac monitor and carotid US for further evaluation.  DOE. Patient is very active riding his bike, using the elliptical and lifting weights 3-4 days a week without shortness of breath or decreased activity tolerance. He has noticed that he is becoming dyspneic with minimal exertional activities such as washing the dishes or going up the steps. Lungs are clear to auscultation. Will get echo for further evaluation.  Hyperlipidemia.  LDL September 2024 65, at goal.  Continue Repatha.  Followed by PCP. History of TIA.  Occurred April 2021 and lasted 30 to 45 minutes.  Echo showed normal LV/RV function and bubble study negative for atrial shunt but could not exclude small late intrahepatic/intrapulmonary shunt, no significant valvular abnormalities.  Patient denies recurrence of similar episode.  Continue Plavix, Repatha.  Disposition: Echo. Carotid US. 30 day cardiac monitor. Metoprolol tartrate 12.5 mg bid prn sustained HR >100. Return in 8-12 weeks after testing or sooner as needed.          Signed, Etta Grandchild. Madalee Altmann, DNP, NP-C

## 2022-10-17 NOTE — Telephone Encounter (Signed)
Spoke with patient who stated this has happened twice now  Scheduled appointment with Tamsen Roers NP today

## 2022-10-18 ENCOUNTER — Ambulatory Visit: Payer: Self-pay

## 2022-10-19 ENCOUNTER — Encounter: Payer: Self-pay | Admitting: Family Medicine

## 2022-10-19 ENCOUNTER — Ambulatory Visit: Payer: 59 | Admitting: Family Medicine

## 2022-10-19 ENCOUNTER — Other Ambulatory Visit: Payer: Self-pay

## 2022-10-19 VITALS — BP 136/81 | HR 79 | Ht 71.0 in | Wt 195.0 lb

## 2022-10-19 DIAGNOSIS — Z23 Encounter for immunization: Secondary | ICD-10-CM

## 2022-10-19 DIAGNOSIS — R002 Palpitations: Secondary | ICD-10-CM

## 2022-10-19 NOTE — Progress Notes (Signed)
    SUBJECTIVE:   CHIEF COMPLAINT / HPI:   Palpitations Syncopal Episodes Patient made appointment before he was seen by cardiology 2 days ago. Had 2 episodes of LOC in middle of night when going to [place]. Has felt his heart racing in the 120s for weeks No chest pain with exertion Is worried because he has two younger kids at home Caffeine - 4 cups per day at least  Reviewed Cardiology visit 9/16 and ECG and lab work  PERTINENT  PMH / PSH: Palpitations, TIA,  Repatha   OBJECTIVE:   BP 136/81   Pulse 79   Ht 5\' 11"  (1.803 m)   Wt 195 lb (88.5 kg)   SpO2 96%   BMI 27.20 kg/m   Alert no distress Heart - Regular rate and rhythm.  No murmurs, gallops or rubs.    Lungs:  Normal respiratory effort, chest expands symmetrically. Lungs are clear to auscultation, no crackles or wheezes. Able to do mild deep knee bends x 10 with evident shortness of breath or chest pain.  Heart rate increased about 10-20 bpm   ASSESSMENT/PLAN:   Encounter for immunization [employer] Covid-19 Vaccine 77yrs & older  Palpitations Assessment & Plan: With syncope or near syncope.  Cardiology work up underway.  I do not have much to add other than wean off caffeine.  He shared he was anxious because has two younger children at home and is worried something may happen to him.        Patient Instructions  Good to see you today - Thank you for coming in  Things we discussed today:  Keep all your appointments  If any things worsens or changes let us know  Wean down coffee     Carney Living, MD [facility]

## 2022-10-19 NOTE — Assessment & Plan Note (Signed)
With syncope or near syncope.  Cardiology work up underway.  I do not have much to add other than wean off caffeine.  He shared he was anxious because has two younger children at home and is worried something may happen to him.

## 2022-10-19 NOTE — Patient Instructions (Signed)
Good to see you today - Thank you for coming in  Things we discussed today:  Keep all your appointments  If any things worsens or changes let us know  Wean down coffee

## 2022-10-24 ENCOUNTER — Encounter: Payer: Self-pay | Admitting: Podiatry

## 2022-10-24 ENCOUNTER — Ambulatory Visit (INDEPENDENT_AMBULATORY_CARE_PROVIDER_SITE_OTHER): Payer: 59 | Admitting: Podiatry

## 2022-10-24 DIAGNOSIS — R42 Dizziness and giddiness: Secondary | ICD-10-CM | POA: Diagnosis not present

## 2022-10-24 DIAGNOSIS — Z8673 Personal history of transient ischemic attack (TIA), and cerebral infarction without residual deficits: Secondary | ICD-10-CM

## 2022-10-24 DIAGNOSIS — Z538 Procedure and treatment not carried out for other reasons: Secondary | ICD-10-CM

## 2022-10-24 DIAGNOSIS — R55 Syncope and collapse: Secondary | ICD-10-CM

## 2022-10-24 DIAGNOSIS — R000 Tachycardia, unspecified: Secondary | ICD-10-CM

## 2022-10-27 ENCOUNTER — Telehealth: Payer: Self-pay | Admitting: Podiatry

## 2022-10-27 NOTE — Telephone Encounter (Signed)
Pt left message on 10/23/2022 at 719pm stating he needed to cancel the appt for 10/24/2022 as his daughter is sick and he has not childcare.

## 2022-10-31 NOTE — Progress Notes (Signed)
cancelled

## 2022-11-10 ENCOUNTER — Ambulatory Visit (HOSPITAL_BASED_OUTPATIENT_CLINIC_OR_DEPARTMENT_OTHER): Payer: 59

## 2022-11-10 ENCOUNTER — Ambulatory Visit (INDEPENDENT_AMBULATORY_CARE_PROVIDER_SITE_OTHER): Payer: 59

## 2022-11-10 DIAGNOSIS — R0609 Other forms of dyspnea: Secondary | ICD-10-CM

## 2022-11-10 DIAGNOSIS — R55 Syncope and collapse: Secondary | ICD-10-CM | POA: Diagnosis not present

## 2022-11-10 DIAGNOSIS — R42 Dizziness and giddiness: Secondary | ICD-10-CM

## 2022-11-10 LAB — ECHOCARDIOGRAM COMPLETE
Area-P 1/2: 3.93 cm2
P 1/2 time: 349 ms
S' Lateral: 2.45 cm

## 2022-11-16 ENCOUNTER — Ambulatory Visit: Payer: 59 | Admitting: Internal Medicine

## 2022-11-16 ENCOUNTER — Encounter: Payer: Self-pay | Admitting: Internal Medicine

## 2022-11-16 VITALS — BP 126/80 | HR 90 | Temp 98.3°F | Resp 16 | Ht 71.0 in | Wt 194.0 lb

## 2022-11-16 DIAGNOSIS — E785 Hyperlipidemia, unspecified: Secondary | ICD-10-CM | POA: Diagnosis not present

## 2022-11-16 DIAGNOSIS — N5201 Erectile dysfunction due to arterial insufficiency: Secondary | ICD-10-CM | POA: Diagnosis not present

## 2022-11-16 DIAGNOSIS — Z0001 Encounter for general adult medical examination with abnormal findings: Secondary | ICD-10-CM

## 2022-11-16 DIAGNOSIS — F322 Major depressive disorder, single episode, severe without psychotic features: Secondary | ICD-10-CM

## 2022-11-16 DIAGNOSIS — E7800 Pure hypercholesterolemia, unspecified: Secondary | ICD-10-CM

## 2022-11-16 DIAGNOSIS — Z0000 Encounter for general adult medical examination without abnormal findings: Secondary | ICD-10-CM

## 2022-11-16 DIAGNOSIS — Z23 Encounter for immunization: Secondary | ICD-10-CM

## 2022-11-16 DIAGNOSIS — D126 Benign neoplasm of colon, unspecified: Secondary | ICD-10-CM | POA: Diagnosis not present

## 2022-11-16 LAB — HEPATIC FUNCTION PANEL
ALT: 19 U/L (ref 0–53)
AST: 20 U/L (ref 0–37)
Albumin: 4.4 g/dL (ref 3.5–5.2)
Alkaline Phosphatase: 63 U/L (ref 39–117)
Bilirubin, Direct: 0.2 mg/dL (ref 0.0–0.3)
Total Bilirubin: 0.8 mg/dL (ref 0.2–1.2)
Total Protein: 7.2 g/dL (ref 6.0–8.3)

## 2022-11-16 MED ORDER — VORTIOXETINE HBR 5 MG PO TABS
5.0000 mg | ORAL_TABLET | Freq: Every day | ORAL | 0 refills | Status: DC
Start: 2022-11-16 — End: 2022-12-13

## 2022-11-16 MED ORDER — SHINGRIX 50 MCG/0.5ML IM SUSR: 0.5000 mL | Freq: Once | INTRAMUSCULAR | 1 refills | Status: AC

## 2022-11-16 MED ORDER — BOOSTRIX 5-2.5-18.5 LF-MCG/0.5 IM SUSP: 0.5000 mL | Freq: Once | INTRAMUSCULAR | 0 refills | Status: AC

## 2022-11-16 MED ORDER — PITAVASTATIN CALCIUM 1 MG PO TABS
1.0000 mg | ORAL_TABLET | Freq: Every day | ORAL | 1 refills | Status: DC
Start: 2022-11-16 — End: 2022-12-20

## 2022-11-16 NOTE — Patient Instructions (Signed)

## 2022-11-16 NOTE — Progress Notes (Signed)
Subjective:  Patient ID: Harry Nash, male    DOB: 1955-03-30  Age: 67 y.o. MRN: [id number]  CC: Hyperlipidemia, Depression, and Annual Exam   HPI Cicel Pasierb Pascal presents for establishing ----  Discussed the use of AI scribe software for clinical note transcription with the patient, who gave verbal consent to proceed.  History of Present Illness   The patient, previously under the care of Dr. Leveda Anna for twelve years, presents with multiple health concerns. He reports a recent episode of syncope, occurring in the middle of the night, associated with an elevated heart rate. This event, which has occurred twice, was preceded by severe nocturnal muscle spasms. The patient has undergone recent cardiac testing, including wearing a heart monitor, a carotid test, and an echocardiogram. He expresses concern about the results of the carotid test.  The patient also discloses a history of erectile dysfunction (ED) for the past five to six years. He has been using Viagra, which he believes may be contributing to his elevated heart rate. He reports that the medication is effective, but its use has been challenging due to the need for planning and the associated increase in heart rate.  The patient has a history of transient ischemic attack (TIA) and has been on statin therapy, which was discontinued due to severe nocturnal leg cramps. He is currently on Repatha and Flomax, the latter for nocturia. He expresses a desire for a long life, but also shares concerns about his mortality and the impact on his family.  The patient's medical history also includes a diagnosis of depression, for which he is seeking treatment. He reports feelings of sadness, anxiety, and insomnia. He also expresses concern about the impact of his age and health on his family, particularly his nine-year-old child.       History [name] has a past medical history of Depression, Headache(784.0), Kidney stones, and TIA (transient  ischemic attack).   He has a past surgical history that includes Hernia repair and Rotator cuff repair.   His family history includes Heart disease in his father and mother; Hypertension in his father.He reports that he quit smoking about 19 years ago. His smoking use included cigarettes. He has never used smokeless tobacco. He reports current alcohol use of about 1.0 standard drink of alcohol per week. He reports that he does not use drugs.  Outpatient Medications Prior to Visit  Medication Sig Dispense Refill   clopidogrel (PLAVIX) 75 MG tablet TAKE 1 TABLET (75 MG) BY MOUTH DAILY 90 tablet 0   Evolocumab (REPATHA SURECLICK) 140 MG/ML SOAJ Inject 140 mg into the skin every 14 (fourteen) days. 6 mL 3   LORazepam (ATIVAN) 0.5 MG tablet TAKE ONE TABLET BY MOUTH TWICE DAILY AS NEEDED FOR ANXIETY 45 tablet 0   metoprolol tartrate (LOPRESSOR) 25 MG tablet Take 0.5 tablets (12.5 mg total) by mouth 2 (two) times daily as needed. For heart rate >100 30 tablet 3   tamsulosin (FLOMAX) 0.4 MG CAPS capsule Take 1 capsule (0.4 mg total) by mouth daily. 90 capsule 1   No facility-administered medications prior to visit.    ROS Review of Systems  Constitutional:  Positive for fatigue. Negative for appetite change, chills, diaphoresis and unexpected weight change.  HENT: Negative.    Eyes: Negative.   Respiratory: Negative.  Negative for cough, chest tightness, shortness of breath and wheezing.   Cardiovascular:  Negative for chest pain, palpitations and leg swelling.  Gastrointestinal:  Negative for abdominal pain, constipation, diarrhea, nausea  and vomiting.  Endocrine: Negative.   Genitourinary: Negative.  Negative for difficulty urinating.  Musculoskeletal: Negative.  Negative for arthralgias and myalgias.  Skin: Negative.   Neurological:  Negative for dizziness, weakness and light-headedness.  Hematological:  Negative for adenopathy. Does not bruise/bleed easily.  Psychiatric/Behavioral:   Positive for decreased concentration and dysphoric mood. Negative for agitation, behavioral problems, confusion, hallucinations, sleep disturbance and suicidal ideas. The patient is nervous/anxious.     Objective:  BP 126/80 (BP Location: Left Arm, Patient Position: Sitting, Cuff Size: Large)   Pulse 90   Temp 98.3 F (36.8 C) (Oral)   Resp 16   Ht 5\' 11"  (1.803 m)   Wt 194 lb (88 kg)   SpO2 96%   BMI 27.06 kg/m   Physical Exam Vitals reviewed.  HENT:     Nose: Nose normal.  Eyes:     General: No scleral icterus.    Conjunctiva/sclera: Conjunctivae normal.  Cardiovascular:     Rate and Rhythm: Normal rate and regular rhythm.     Heart sounds: No murmur heard. Pulmonary:     Effort: Pulmonary effort is normal.     Breath sounds: No stridor. No wheezing, rhonchi or rales.  Abdominal:     Palpations: There is no mass.     Tenderness: There is no abdominal tenderness. There is no guarding.     Hernia: No hernia is present.  Musculoskeletal:        General: Normal range of motion.     Cervical back: Neck supple.     Right lower leg: No edema.     Left lower leg: No edema.  Lymphadenopathy:     Cervical: No cervical adenopathy.  Skin:    General: Skin is warm and dry.  Neurological:     General: No focal deficit present.     Mental Status: Mental status is at baseline.  Psychiatric:        Attention and Perception: Attention normal. He is attentive. He does not perceive visual hallucinations.        Mood and Affect: Mood is anxious and depressed. Affect is tearful.        Speech: Speech normal. Speech is not delayed or tangential.        Behavior: Behavior normal. Behavior is cooperative.        Thought Content: Thought content normal. Thought content is not paranoid or delusional. Thought content does not include homicidal or suicidal ideation.        Cognition and Memory: Cognition is impaired. Memory is impaired.     Lab Results  Component Value Date   WBC 6.5  10/10/2022   HGB 14.3 10/10/2022   HCT 44.0 10/10/2022   PLT 213 10/10/2022   GLUCOSE 81 10/10/2022   CHOL 145 10/10/2022   TRIG 156 (H) 10/10/2022   HDL 54 10/10/2022   LDLDIRECT 74 02/22/2021   LDLCALC 65 10/10/2022   ALT 19 11/16/2022   AST 20 11/16/2022   NA 141 10/10/2022   K 4.1 10/10/2022   CL 106 10/10/2022   CREATININE 0.85 10/10/2022   BUN 13 10/10/2022   CO2 22 10/10/2022   TSH 1.060 10/10/2022   PSA 0.6 10/10/2022   INR 1.1 05/07/2019   HGBA1C 5.3 05/07/2019     ECHOCARDIOGRAM COMPLETE  Result Date: 11/10/2022    ECHOCARDIOGRAM REPORT   Patient Name:   BYRANT CHAIRS Eleazer Date of Exam: 11/10/2022 Medical Rec #:  [id number]  Height:       71.0 in Accession #:    [id number]        Weight:       195.0 lb Date of Birth:  [birth year]          BSA:          2.086 m Patient Age:    67 years          BP:           112/81 mmHg Patient Gender: M                 HR:           93 bpm. Exam Location:  Outpatient Procedure: 2D Echo, 3D Echo, Cardiac Doppler, Color Doppler and Strain Analysis Indications:    R06.9 DOE  History:        Patient has prior history of Echocardiogram examinations, most                 recent 05/08/2019. TIA; Risk Factors:Dyslipidemia and Former                 Smoker.  Sonographer:    Jeryl Columbia RDCS Referring Phys: [id number] [facility] IMPRESSIONS  1. Left ventricular ejection fraction, by estimation, is 55 to 60%. Left ventricular ejection fraction by 3D volume is 58 %. The left ventricle has normal function. The left ventricle has no regional wall motion abnormalities. There is mild concentric left ventricular hypertrophy. Left ventricular diastolic parameters are consistent with Grade I diastolic dysfunction (impaired relaxation).  2. Right ventricular systolic function is normal. The right ventricular size is mildly enlarged. There is normal pulmonary artery systolic pressure. The estimated right ventricular systolic pressure is 18.8 mmHg.  3. The  mitral valve is normal in structure. No evidence of mitral valve regurgitation. No evidence of mitral stenosis.  4. The aortic valve is tricuspid. Aortic valve regurgitation is mild. Aortic valve sclerosis/calcification is present, without any evidence of aortic stenosis.  5. The inferior vena cava is normal in size with greater than 50% respiratory variability, suggesting right atrial pressure of 3 mmHg.  6. Ascending aorta measurements are within normal limits for age when indexed to body surface area. FINDINGS  Left Ventricle: Left ventricular ejection fraction, by estimation, is 55 to 60%. Left ventricular ejection fraction by 3D volume is 58 %. The left ventricle has normal function. The left ventricle has no regional wall motion abnormalities. The left ventricular internal cavity size was normal in size. There is mild concentric left ventricular hypertrophy. Left ventricular diastolic parameters are consistent with Grade I diastolic dysfunction (impaired relaxation). Normal left ventricular filling pressure. Right Ventricle: The right ventricular size is mildly enlarged. No increase in right ventricular wall thickness. Right ventricular systolic function is normal. There is normal pulmonary artery systolic pressure. The tricuspid regurgitant velocity is 1.99  m/s, and with an assumed right atrial pressure of 3 mmHg, the estimated right ventricular systolic pressure is 18.8 mmHg. Left Atrium: Left atrial size was normal in size. Right Atrium: Right atrial size was normal in size. Pericardium: There is no evidence of pericardial effusion. Mitral Valve: The mitral valve is normal in structure. No evidence of mitral valve regurgitation. No evidence of mitral valve stenosis. Tricuspid Valve: The tricuspid valve is normal in structure. Tricuspid valve regurgitation is trivial. No evidence of tricuspid stenosis. Aortic Valve: The aortic valve is tricuspid. Aortic valve regurgitation is mild. Aortic regurgitation PHT  measures 349 msec. Aortic valve  sclerosis/calcification is present, without any evidence of aortic stenosis. Pulmonic Valve: The pulmonic valve was normal in structure. Pulmonic valve regurgitation is not visualized. No evidence of pulmonic stenosis. Aorta: The aortic root is normal in size and structure. Ascending aorta measurements are within normal limits for age when indexed to body surface area. Venous: The inferior vena cava is normal in size with greater than 50% respiratory variability, suggesting right atrial pressure of 3 mmHg. IAS/Shunts: No atrial level shunt detected by color flow Doppler.  LEFT VENTRICLE PLAX 2D LVIDd:         4.19 cm         Diastology LVIDs:         2.45 cm         LV e' medial:    6.31 cm/s LV PW:         1.44 cm         LV E/e' medial:  6.1 LV IVS:        1.25 cm         LV e' lateral:   4.68 cm/s LVOT diam:     2.20 cm         LV E/e' lateral: 8.2 LV SV:         46 LV SV Index:   22 LVOT Area:     3.80 cm        3D Volume EF                                LV 3D EF:    Left                                             ventricul                                             ar                                             ejection                                             fraction                                             by 3D                                             volume is                                             58 %.  3D Volume EF:                                3D EF:        58 %                                LV EDV:       130 ml                                LV ESV:       55 ml                                LV SV:        75 ml RIGHT VENTRICLE RV Basal diam:  4.42 cm RV Mid diam:    3.06 cm RV S prime:     13.50 cm/s TAPSE (M-mode): 2.9 cm LEFT ATRIUM           Index        RIGHT ATRIUM           Index LA diam:      3.60 cm 1.73 cm/m   RA Area:     15.40 cm LA Vol (A2C): 58.5 ml 28.04 ml/m  RA Volume:   36.60 ml  17.55 ml/m LA Vol  (A4C): 20.1 ml 9.64 ml/m  AORTIC VALVE LVOT Vmax:   67.60 cm/s LVOT Vmean:  44.100 cm/s LVOT VTI:    0.122 m AI PHT:      349 msec  AORTA Ao Root diam: 3.80 cm Ao Asc diam:  3.90 cm MITRAL VALVE               TRICUSPID VALVE MV Area (PHT): 3.93 cm    TR Peak grad:   15.8 mmHg MV Decel Time: 193 msec    TR Vmax:        199.00 cm/s MV E velocity: 38.40 cm/s MV A velocity: 65.10 cm/s  SHUNTS MV E/A ratio:  0.59        Systemic VTI:  0.12 m                            Systemic Diam: 2.20 cm Armanda Magic MD Electronically signed by Armanda Magic MD Signature Date/Time: 11/10/2022/9:17:23 PM    Final    VAS US CAROTID  Result Date: 11/10/2022 Carotid Arterial Duplex Study Patient Name:  MIGUELANGEL TRAY Chou  Date of Exam:   11/10/2022 Medical Rec #: [id number]          Accession #:    [id number] Date of Birth: 1955-04-19           Patient Gender: M Patient Age:   75 years Exam Location:  [facility] Procedure:      VAS US CAROTID Referring Phys: Carlos Levering --------------------------------------------------------------------------------  Indications:       Patient reports he was leaving the bathroom he became                    diaphoretic and began hyperventilating he became dizzy and                    fell.  Patient reports similar episodes 6-8 months ago but                    passed out completely for a few seconds. Risk Factors:      Hyperlipidemia, past history of smoking. Comparison Study:  NA Performing Technologist: Jeryl Columbia RDCS  Examination Guidelines: A complete evaluation includes B-mode imaging, spectral Doppler, color Doppler, and power Doppler as needed of all accessible portions of each vessel. Bilateral testing is considered an integral part of a complete examination. Limited examinations for reoccurring indications may be performed as noted.  Right Carotid Findings: +----------+--------+--------+--------+------------------+--------+           PSV cm/sEDV cm/sStenosisPlaque  DescriptionComments +----------+--------+--------+--------+------------------+--------+ CCA Prox  120     23                                         +----------+--------+--------+--------+------------------+--------+ CCA Mid   136     40                                         +----------+--------+--------+--------+------------------+--------+ CCA Distal110     30                                         +----------+--------+--------+--------+------------------+--------+ ICA Prox  94      33      1-39%   heterogenous               +----------+--------+--------+--------+------------------+--------+ ICA Mid   81      32                                         +----------+--------+--------+--------+------------------+--------+ ICA Distal62      20                                         +----------+--------+--------+--------+------------------+--------+ ECA       125     29                                         +----------+--------+--------+--------+------------------+--------+ +----------+--------+-------+----------------+-------------------+           PSV cm/sEDV cmsDescribe        Arm Pressure (mmHG) +----------+--------+-------+----------------+-------------------+ Subclavian141     3      Multiphasic, [id number]                 +----------+--------+-------+----------------+-------------------+ +---------+--------+--+--------+--+---------+ VertebralPSV cm/s38EDV cm/s12Antegrade +---------+--------+--+--------+--+---------+  Left Carotid Findings: +----------+--------+--------+--------+------------------+--------+           PSV cm/sEDV cm/sStenosisPlaque DescriptionComments +----------+--------+--------+--------+------------------+--------+ CCA Prox  135     31                                         +----------+--------+--------+--------+------------------+--------+ CCA Mid   133     27                                          +----------+--------+--------+--------+------------------+--------+  CCA Distal104     34                                         +----------+--------+--------+--------+------------------+--------+ ICA Prox  85      28              heterogenous               +----------+--------+--------+--------+------------------+--------+ ICA Mid   123     47      40-59%                    tortuous +----------+--------+--------+--------+------------------+--------+ ICA Distal42      18                                         +----------+--------+--------+--------+------------------+--------+ ECA       72      21                                tortuous +----------+--------+--------+--------+------------------+--------+ +----------+--------+--------+----------------+-------------------+           PSV cm/sEDV cm/sDescribe        Arm Pressure (mmHG) +----------+--------+--------+----------------+-------------------+ Subclavian130     17      Multiphasic, [id number]                 +----------+--------+--------+----------------+-------------------+ +---------+--------+--+--------+--+---------+ VertebralPSV cm/s41EDV cm/s12Antegrade +---------+--------+--+--------+--+---------+   Summary: Right Carotid: Velocities in the right ICA are consistent with a 1-39% stenosis. Left Carotid: Velocities in the left ICA are consistent with a 40-59% stenosis. Vertebrals:  Bilateral vertebral arteries demonstrate antegrade flow. Subclavians: Normal flow hemodynamics were seen in bilateral subclavian              arteries. *See table(s) above for measurements and observations. Suggest follow up study in 12 months. Electronically signed by Lorine Bears MD on 11/10/2022 at 4:31:57 PM.    Final      Assessment & Plan:  Current severe episode of major depressive disorder without psychotic features without prior episode (HCC) -     Vortioxetine HBr; Take 1 tablet (5 mg total) by mouth daily.   Dispense: 30 tablet; Refill: 0  Erectile dysfunction due to arterial insufficiency- Labs are negative for secondary causes. -     Testosterone Total,Free,Bio, Males; Future -     Prolactin; Future -     Ambulatory referral to Urology  HYPERCHOLESTEROLEMIA -     Hepatic function panel; Future  Need for vaccination -     Pneumococcal conjugate vaccine 20-valent -     Boostrix; Inject 0.5 mLs into the muscle once for 1 dose.  Dispense: 0.5 mL; Refill: 0  Benign neoplasm of colon, unspecified part of colon -     Ambulatory referral to Gastroenterology  Dyslipidemia, goal LDL below 70- Will start a statin for CV risk reduction. -     Pitavastatin Calcium; Take 1 tablet (1 mg total) by mouth daily.  Dispense: 90 tablet; Refill: 1 -     CT CARDIAC SCORING ([facility] LOCATIONS ONLY); Future  Encounter for general adult medical examination with abnormal findings- Exam completed, labs reviewed, vaccines reviewed and updated, cancer screenings addressed, pt ed material was given.   Other orders -  Shingrix; Inject 0.5 mLs into the muscle once for 1 dose.  Dispense: 0.5 mL; Refill: 1 -     Boostrix; Inject 0.5 mLs into the muscle once for 1 dose.  Dispense: 0.5 mL; Refill: 0      Follow-up: Return in about 3 months (around 02/16/2023).  Sanda Linger, MD

## 2022-11-17 LAB — TESTOSTERONE TOTAL,FREE,BIO, MALES
Albumin: 4.6 g/dL (ref 3.6–5.1)
Sex Hormone Binding: 63 nmol/L (ref 22–77)
Testosterone, Bioavailable: 126.6 ng/dL (ref 110.0–575.0)
Testosterone, Free: 60.3 pg/mL (ref 46.0–224.0)
Testosterone: 755 ng/dL (ref 250–827)

## 2022-11-17 LAB — PROLACTIN: Prolactin: 3.4 ng/mL (ref 2.0–18.0)

## 2022-11-19 ENCOUNTER — Other Ambulatory Visit (HOSPITAL_COMMUNITY): Payer: Self-pay

## 2022-11-19 DIAGNOSIS — E785 Hyperlipidemia, unspecified: Secondary | ICD-10-CM | POA: Insufficient documentation

## 2022-11-19 MED ORDER — BOOSTRIX 5-2.5-18.5 LF-MCG/0.5 IM SUSP
0.5000 mL | Freq: Once | INTRAMUSCULAR | 0 refills | Status: AC
Start: 2022-11-19 — End: 2022-11-19
  Filled 2022-11-19: qty 0.5, 1d supply, fill #0

## 2022-11-19 NOTE — Addendum Note (Signed)
Addended by: Etta Grandchild on: 11/19/2022 11:48 AM   Modules accepted: Level of Service

## 2022-11-24 ENCOUNTER — Ambulatory Visit: Payer: 59 | Attending: Student

## 2022-11-24 DIAGNOSIS — R42 Dizziness and giddiness: Secondary | ICD-10-CM

## 2022-11-24 DIAGNOSIS — R55 Syncope and collapse: Secondary | ICD-10-CM

## 2022-11-24 DIAGNOSIS — R000 Tachycardia, unspecified: Secondary | ICD-10-CM

## 2022-11-24 DIAGNOSIS — Z8673 Personal history of transient ischemic attack (TIA), and cerebral infarction without residual deficits: Secondary | ICD-10-CM

## 2022-11-25 ENCOUNTER — Telehealth: Payer: Self-pay | Admitting: Pharmacy Technician

## 2022-11-25 NOTE — Telephone Encounter (Signed)
Pharmacy Patient Advocate Encounter  Received notification from [employer] that Prior Authorization for repatha has been APPROVED from 11/24/22 to 11/24/23   PA #/Case ID/Reference #: [id number]      Pharmacy Patient Advocate Encounter   Received notification from Fax that prior authorization for repatha is required/requested.   Insurance verification completed.   The patient is insured through [employer] .   Per test claim: PA required; PA submitted to [employer] via Fax Key/confirmation #/EOC Faxed Status is pending

## 2022-11-30 ENCOUNTER — Other Ambulatory Visit: Payer: 59

## 2022-12-06 ENCOUNTER — Encounter: Payer: Self-pay | Admitting: Pharmacist

## 2022-12-07 ENCOUNTER — Encounter: Payer: Self-pay | Admitting: Internal Medicine

## 2022-12-07 ENCOUNTER — Inpatient Hospital Stay
Admission: RE | Admit: 2022-12-07 | Discharge: 2022-12-07 | Discharge status: Home / Self Care | Payer: No Typology Code available for payment source | Source: Ambulatory Visit | Attending: Internal Medicine | Admitting: Internal Medicine

## 2022-12-07 DIAGNOSIS — E785 Hyperlipidemia, unspecified: Secondary | ICD-10-CM

## 2022-12-13 ENCOUNTER — Other Ambulatory Visit: Payer: Self-pay | Admitting: Internal Medicine

## 2022-12-13 DIAGNOSIS — F322 Major depressive disorder, single episode, severe without psychotic features: Secondary | ICD-10-CM

## 2022-12-13 MED ORDER — VORTIOXETINE HBR 10 MG PO TABS
10.0000 mg | ORAL_TABLET | Freq: Every day | ORAL | 0 refills | Status: DC
Start: 2022-12-13 — End: 2023-01-24

## 2022-12-20 ENCOUNTER — Encounter (HOSPITAL_BASED_OUTPATIENT_CLINIC_OR_DEPARTMENT_OTHER): Payer: Self-pay | Admitting: Family

## 2022-12-20 ENCOUNTER — Ambulatory Visit (INDEPENDENT_AMBULATORY_CARE_PROVIDER_SITE_OTHER): Payer: 59 | Admitting: Family

## 2022-12-20 VITALS — BP 132/76 | HR 84 | Ht 71.0 in | Wt 193.3 lb

## 2022-12-20 DIAGNOSIS — E785 Hyperlipidemia, unspecified: Secondary | ICD-10-CM

## 2022-12-20 DIAGNOSIS — R002 Palpitations: Secondary | ICD-10-CM

## 2022-12-20 DIAGNOSIS — I493 Ventricular premature depolarization: Secondary | ICD-10-CM

## 2022-12-20 DIAGNOSIS — Z8673 Personal history of transient ischemic attack (TIA), and cerebral infarction without residual deficits: Secondary | ICD-10-CM | POA: Diagnosis not present

## 2022-12-20 DIAGNOSIS — I351 Nonrheumatic aortic (valve) insufficiency: Secondary | ICD-10-CM

## 2022-12-20 DIAGNOSIS — I6523 Occlusion and stenosis of bilateral carotid arteries: Secondary | ICD-10-CM

## 2022-12-20 DIAGNOSIS — Z8249 Family history of ischemic heart disease and other diseases of the circulatory system: Secondary | ICD-10-CM

## 2022-12-20 MED ORDER — PRAVASTATIN SODIUM 10 MG PO TABS
10.0000 mg | ORAL_TABLET | ORAL | 5 refills | Status: DC
Start: 2022-12-21 — End: 2023-02-16

## 2022-12-20 NOTE — Progress Notes (Signed)
Cardiology Office Note:  .   Date:  12/20/2022  ID:  Lauralyn Primes Magnone, DOB 08/15/1955, MRN [id number] PCP: Etta Grandchild, MD  [facility] Providers Cardiologist:  Jodelle Red, MD    History of Present Illness: .   Miklo Threats Darrington is a 67 y.o. male with a history of palpitations, TIA 05/2019, carotid stenosis, HLD.  Family history of coronary artery disease.  Workup for TIA 05/2019 with echo normal LV/RV function, bubble study negative, unable to exclude small late intrahepatic/intrapulmonary shunt.  Aspirin was transition to Plavix.  Last seen 10/17/22 noting palpitations, syncope, dizziness, exertional dyspnea.  He walked up to use the restroom at night and got tachycardic and syncopal.  Carotid duplex, echo, 30-day monitor were ordered.  He was given metoprolol titrate as needed.  Echo 11/2022 normal LVEF, grade 1 diastolic dysfunction, mild AI.  Carotid duplex 11/2022 right ICA 1 to 39% stenosis, left ICA 40 to 59% stenosed.  30-day monitor preliminary report with predominantly normal sinus rhythm average heart rate 88 bpm, 2% PVC burden, 1% PAC burden.  Triggered his episodes of sinus stated with sinus rhythm with PVC or sinus tachycardia.  Presents today for follow up. 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 We reviewed echo, carotid duplex, monitor in detail. He had coronary calcium score 12/07/22, result not yet available. Notes unable to get Pitavastatin from pharmacy, previously had leg and back cramps on Rosuvastatin. Agreeable to trial alternate statin. Still with occasional episodes of heart racing, has not used PRN Metoprolol but does have on hand. No exertional dyspnea, chest pain.   ROS: Please see the history of present illness.    All other systems reviewed and are negative.   Studies Reviewed: .        Cardiac Studies & Procedures        ECHOCARDIOGRAM  ECHOCARDIOGRAM COMPLETE 11/10/2022  Narrative ECHOCARDIOGRAM REPORT    Patient Name:   COLE FURRH Hawes Date of Exam: 11/10/2022 Medical Rec #:  [id number]         Height:       71.0 in Accession #:    [id number]        Weight:       195.0 lb Date of Birth:  1955/08/08          BSA:          2.086 m Patient Age:    67 years          BP:           112/81 mmHg Patient Gender: M                 HR:           93 bpm. Exam Location:  Outpatient  Procedure: 2D Echo, 3D Echo, Cardiac Doppler, Color Doppler and Strain Analysis  Indications:    R06.9 DOE  History:        Patient has prior history of Echocardiogram examinations, most recent 05/08/2019. TIA; Risk Factors:Dyslipidemia and Former Smoker.  Sonographer:    Jeryl Columbia RDCS Referring Phys: [id number] [facility] [place]  IMPRESSIONS   1. Left ventricular ejection fraction, by estimation, is 55 to 60%. Left ventricular ejection fraction by 3D volume is 58 %. The left ventricle has normal function. The left ventricle has no regional wall motion abnormalities. There is mild concentric left ventricular hypertrophy. Left ventricular diastolic  parameters are consistent with Grade I diastolic dysfunction (impaired relaxation). 2. Right ventricular systolic function is normal. The right ventricular size is mildly enlarged. There is normal pulmonary artery systolic pressure. The estimated right ventricular systolic pressure is 18.8 mmHg. 3. The mitral valve is normal in structure. No evidence of mitral valve regurgitation. No evidence of mitral stenosis. 4. The aortic valve is tricuspid. Aortic valve regurgitation is mild. Aortic valve sclerosis/calcification is present, without any evidence of aortic stenosis. 5. The inferior vena cava is normal in size with greater than 50% respiratory variability, suggesting right atrial pressure of 3 mmHg. 6. Ascending aorta measurements are within normal limits for age when indexed to  body surface area.  FINDINGS Left Ventricle: Left ventricular ejection fraction, by estimation, is 55 to 60%. Left ventricular ejection fraction by 3D volume is 58 %. The left ventricle has normal function. The left ventricle has no regional wall motion abnormalities. The left ventricular internal cavity size was normal in size. There is mild concentric left ventricular hypertrophy. Left ventricular diastolic parameters are consistent with Grade I diastolic dysfunction (impaired relaxation). Normal left ventricular filling pressure.  Right Ventricle: The right ventricular size is mildly enlarged. No increase in right ventricular wall thickness. Right ventricular systolic function is normal. There is normal pulmonary artery systolic pressure. The tricuspid regurgitant velocity is 1.99 m/s, and with an assumed right atrial pressure of 3 mmHg, the estimated right ventricular systolic pressure is 18.8 mmHg.  Left Atrium: Left atrial size was normal in size.  Right Atrium: Right atrial size was normal in size.  Pericardium: There is no evidence of pericardial effusion.  Mitral Valve: The mitral valve is normal in structure. No evidence of mitral valve regurgitation. No evidence of mitral valve stenosis.  Tricuspid Valve: The tricuspid valve is normal in structure. Tricuspid valve regurgitation is trivial. No evidence of tricuspid stenosis.  Aortic Valve: The aortic valve is tricuspid. Aortic valve regurgitation is mild. Aortic regurgitation PHT measures 349 msec. Aortic valve sclerosis/calcification is present, without any evidence of aortic stenosis.  Pulmonic Valve: The pulmonic valve was normal in structure. Pulmonic valve regurgitation is not visualized. No evidence of pulmonic stenosis.  Aorta: The aortic root is normal in size and structure. Ascending aorta measurements are within normal limits for age when indexed to body surface area.  Venous: The inferior vena cava is normal in size  with greater than 50% respiratory variability, suggesting right atrial pressure of 3 mmHg.  IAS/Shunts: No atrial level shunt detected by color flow Doppler.   LEFT VENTRICLE PLAX 2D LVIDd:         4.19 cm         Diastology LVIDs:         2.45 cm         LV e' medial:    6.31 cm/s LV PW:         1.44 cm         LV E/e' medial:  6.1 LV IVS:        1.25 cm         LV e' lateral:   4.68 cm/s LVOT diam:     2.20 cm         LV E/e' lateral: 8.2 LV SV:         46 LV SV Index:   22 LVOT Area:     3.80 cm        3D Volume EF LV 3D EF:    Left ventricul  ar ejection fraction by 3D volume is 58 %.  3D Volume EF: 3D EF:        58 % LV EDV:       130 ml LV ESV:       55 ml LV SV:        75 ml  RIGHT VENTRICLE RV Basal diam:  4.42 cm RV Mid diam:    3.06 cm RV S prime:     13.50 cm/s TAPSE (M-mode): 2.9 cm  LEFT ATRIUM           Index        RIGHT ATRIUM           Index LA diam:      3.60 cm 1.73 cm/m   RA Area:     15.40 cm LA Vol (A2C): 58.5 ml 28.04 ml/m  RA Volume:   36.60 ml  17.55 ml/m LA Vol (A4C): 20.1 ml 9.64 ml/m AORTIC VALVE LVOT Vmax:   67.60 cm/s LVOT Vmean:  44.100 cm/s LVOT VTI:    0.122 m AI PHT:      349 msec  AORTA Ao Root diam: 3.80 cm Ao Asc diam:  3.90 cm  MITRAL VALVE               TRICUSPID VALVE MV Area (PHT): 3.93 cm    TR Peak grad:   15.8 mmHg MV Decel Time: 193 msec    TR Vmax:        199.00 cm/s MV E velocity: 38.40 cm/s MV A velocity: 65.10 cm/s  SHUNTS MV E/A ratio:  0.59        Systemic VTI:  0.12 m Systemic Diam: 2.20 cm  Armanda Magic MD Electronically signed by Armanda Magic MD Signature Date/Time: 11/10/2022/9:17:23 PM    Final             Risk Assessment/Calculations:             Physical Exam:   VS:  BP 132/76   Pulse 84   Ht 5\' 11"  (1.803 m)   Wt 193 lb 4.8 oz (87.7 kg)   SpO2 95%   BMI 26.96 kg/m    Wt Readings from Last 3 Encounters:  12/20/22 193 lb 4.8 oz (87.7 kg)  11/16/22 194 lb (88 kg)  10/19/22  195 lb (88.5 kg)    GEN: Well nourished, well developed in no acute distress NECK: No JVD; No carotid bruits CARDIAC: RRR, no murmurs, rubs, gallops RESPIRATORY:  Clear to auscultation without rales, wheezing or rhonchi  ABDOMEN: Soft, non-tender, non-distended EXTREMITIES:  No edema; No deformity   ASSESSMENT AND PLAN: .    History of TIA / HLD, LDL goal <70 -continue Repatha.  Did not tolerate rosuvastatin.  Unable to get the Pitavastatin from the pharmacy.  Will Rx pravastatin 10 mg 3 times per week.  If he tolerates well could increase to daily dosing in the future.  Palpitations -30-day monitor predominately normal sinus rhythm average heart rate 88 bpm with 2% PVC burden.  Triggered episodes associated with sinus rhythm, PVC, sinus tachycardia.  Palpitations have been less bothersome.  He will continue metoprolol tartrate as needed for palpitations.  As they are not recurrent on a daily basis no indication for daily dosing.  Encouraged reduced caffeine intake, increasing oral hydration to prevent palpitations.  Syncope-prior syncopal episode with unremarkable echocardiogram and carotid duplex with only mild to moderate stenosis not significant of to cause syncope.  Anticipate prior syncopal spell  was related to orthostatic hypotension.  Encourage adequate hydration, making position changes slowly, limiting caffeine.  No indication for further cardiac workup at this time.  Carotid stenosis -carotid duplex 11/2022 right carotid 1-39% stenosis, left carotid 40-59% stenosis.  Continue optimal lipid control, as above.  Plan for repeat carotid duplex in 1 year, can be ordered at follow-up.  Mild aortic regurgitation - Echo 11/11/22 normal LVEF with mild aortic regurgitation. Not of concern. Repeat echo only as clinically indicated.  Family history of CAD -had coronary calcium score earlier this month as ordered by PCP.  Results pending.  He reports no anginal symptoms.       Dispo: follow up  in 6 months  Signed, Alver Sorrow, NP

## 2022-12-20 NOTE — Patient Instructions (Addendum)
Medication Instructions:   START Pravastatin 10mg  three times per week *If you need a refill on your cardiac medications before your next appointment, please call your pharmacy*   Follow-Up: At [facility], you and your health needs are our priority.  As part of our continuing mission to provide you with exceptional heart care, we have created designated Provider Care Teams.  These Care Teams include your primary Cardiologist (physician) and Advanced Practice Providers (APPs -  Physician Assistants and Nurse Practitioners) who all work together to provide you with the care you need, when you need it.  We recommend signing up for the patient portal called "MyChart".  Sign up information is provided on this After Visit Summary.  MyChart is used to connect with patients for Virtual Visits (Telemedicine).  Patients are able to view lab/test results, encounter notes, upcoming appointments, etc.  Non-urgent messages can be sent to your provider as well.   To learn more about what you can do with MyChart, go to [email or web address].    Your next appointment:   6 month(s)  Provider:   Jodelle Red, MD or Gillian Shields, NP    Other Instructions  If desired could trial:  Magnesium glycinate   To prevent palpitations: Make sure you are adequately hydrated.  Avoid and/or limit caffeine containing beverages like soda or tea. Exercise regularly.  Manage stress well. Some over the counter medications can cause palpitations such as Benadryl, AdvilPM, TylenolPM. Regular Advil or Tylenol do not cause palpitations.

## 2022-12-21 ENCOUNTER — Other Ambulatory Visit: Payer: Self-pay | Admitting: Cardiology

## 2022-12-21 DIAGNOSIS — Z8673 Personal history of transient ischemic attack (TIA), and cerebral infarction without residual deficits: Secondary | ICD-10-CM

## 2022-12-21 DIAGNOSIS — E7800 Pure hypercholesterolemia, unspecified: Secondary | ICD-10-CM

## 2023-01-04 ENCOUNTER — Other Ambulatory Visit: Payer: Self-pay | Admitting: Family Medicine

## 2023-01-10 ENCOUNTER — Ambulatory Visit (HOSPITAL_BASED_OUTPATIENT_CLINIC_OR_DEPARTMENT_OTHER): Payer: 59 | Admitting: Family

## 2023-01-12 ENCOUNTER — Encounter (HOSPITAL_BASED_OUTPATIENT_CLINIC_OR_DEPARTMENT_OTHER): Payer: Self-pay

## 2023-01-19 ENCOUNTER — Other Ambulatory Visit: Payer: Self-pay | Admitting: Internal Medicine

## 2023-01-19 DIAGNOSIS — F322 Major depressive disorder, single episode, severe without psychotic features: Secondary | ICD-10-CM

## 2023-01-23 ENCOUNTER — Encounter: Payer: Self-pay | Admitting: Internal Medicine

## 2023-02-16 ENCOUNTER — Encounter: Payer: Self-pay | Admitting: Internal Medicine

## 2023-02-16 ENCOUNTER — Ambulatory Visit: Payer: 59 | Admitting: Internal Medicine

## 2023-02-16 VITALS — BP 126/86 | HR 97 | Temp 98.1°F | Ht 71.0 in | Wt 193.8 lb

## 2023-02-16 DIAGNOSIS — F322 Major depressive disorder, single episode, severe without psychotic features: Secondary | ICD-10-CM

## 2023-02-16 DIAGNOSIS — Z23 Encounter for immunization: Secondary | ICD-10-CM | POA: Diagnosis not present

## 2023-02-16 MED ORDER — BUPROPION HCL ER (XL) 150 MG PO TB24: 150.0000 mg | ORAL_TABLET | Freq: Every day | ORAL | 1 refills | Status: DC

## 2023-02-16 MED ORDER — VORTIOXETINE HBR 5 MG PO TABS: 5.0000 mg | ORAL_TABLET | Freq: Every day | ORAL | 1 refills | Status: DC

## 2023-02-16 NOTE — Progress Notes (Signed)
Subjective:  Patient ID: Harry Nash, male    DOB: [birth year]  Age: 68 y.o. MRN: [id number]  CC: Depression   HPI Harry Nash presents for f/up -----  Discussed the use of AI scribe software for clinical note transcription with the patient, who gave verbal consent to proceed.  History of Present Illness   The patient, with a history of anxiety and depression, has been managing his symptoms with Trintellix. However, he reports experiencing decreased libido and difficulty achieving orgasm since starting the medication, which he finds undesirable. Despite these side effects, he notes an overall improvement in his mood and less annoyance with daily life. He has also discontinued Flomax due to unpleasant experiences.  In addition to managing his mental health, the patient has been dealing with cardiovascular concerns. He recently saw a cardiologist who reassured him about his condition. However, he experienced muscle spasms and discomfort after starting a new statin, leading to its discontinuation. He continues to take Plavix and Repatha for his cardiovascular health.  The patient has also been dealing with significant life stressors, including the death of several close friends and family members in a short span of time. 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 Despite these challenges, he reports feeling happy in his home life.       Outpatient Medications Prior to Visit  Medication Sig Dispense Refill   clopidogrel (PLAVIX) 75 MG tablet TAKE 1 TABLET (75 MG) BY MOUTH DAILY, need to schedule appointment with PCP 90 tablet 0   LORazepam (ATIVAN) 0.5 MG tablet TAKE ONE TABLET BY MOUTH TWICE DAILY AS NEEDED FOR ANXIETY 45 tablet 0   metoprolol tartrate (LOPRESSOR) 25 MG tablet Take 0.5 tablets (12.5 mg total) by mouth 2 (two) times daily as needed. For heart rate >100 30 tablet 3   REPATHA  SURECLICK 140 MG/ML SOAJ Inject 140 mg into the skin every 14 (fourteen) days. 6 mL 3   TRINTELLIX 10 MG TABS tablet Take 1 tablet (10 mg total) by mouth daily. 30 tablet 0   pravastatin (PRAVACHOL) 10 MG tablet Take 1 tablet (10 mg total) by mouth 3 (three) times a week. 15 tablet 5   tamsulosin (FLOMAX) 0.4 MG CAPS capsule Take 1 capsule (0.4 mg total) by mouth daily. 90 capsule 1   No facility-administered medications prior to visit.    ROS Review of Systems  Constitutional: Negative.  Negative for appetite change, chills, diaphoresis and fatigue.  HENT: Negative.  Negative for trouble swallowing.   Eyes: Negative.   Respiratory:  Negative for cough, chest tightness, shortness of breath and wheezing.   Cardiovascular:  Negative for chest pain, palpitations and leg swelling.  Gastrointestinal:  Negative for abdominal pain, constipation, diarrhea, nausea and vomiting.  Endocrine: Negative.   Genitourinary: Negative.  Negative for difficulty urinating.  Musculoskeletal:  Negative for arthralgias, joint swelling and myalgias.  Skin: Negative.   Neurological: Negative.  Negative for dizziness and weakness.  Hematological:  Negative for adenopathy. Does not bruise/bleed easily.  Psychiatric/Behavioral:  Positive for dysphoric mood. Negative for behavioral problems, decreased concentration, sleep disturbance and suicidal ideas. The patient is not nervous/anxious.     Objective:  BP 126/86 (BP Location: Left Arm, Patient Position: Sitting, Cuff Size: Normal)   Pulse 97   Temp 98.1 F (36.7 C) (Oral)   Ht 5\' 11"  (1.803 m)   Wt 193 lb 12.8 oz (87.9 kg)  SpO2 95%   BMI 27.03 kg/m   BP Readings from Last 3 Encounters:  02/16/23 126/86  12/20/22 132/76  11/16/22 126/80    Wt Readings from Last 3 Encounters:  02/16/23 193 lb 12.8 oz (87.9 kg)  12/20/22 193 lb 4.8 oz (87.7 kg)  11/16/22 194 lb (88 kg)    Physical Exam Vitals reviewed.  Constitutional:      Appearance: Normal  appearance.  HENT:     Mouth/Throat:     Mouth: Mucous membranes are moist.  Eyes:     General: No scleral icterus.    Conjunctiva/sclera: Conjunctivae normal.  Cardiovascular:     Rate and Rhythm: Normal rate and regular rhythm.     Heart sounds: No murmur heard. Pulmonary:     Effort: Pulmonary effort is normal.     Breath sounds: No stridor. No wheezing, rhonchi or rales.  Abdominal:     General: Abdomen is flat.     Palpations: There is no mass.     Tenderness: There is no abdominal tenderness. There is no guarding.     Hernia: No hernia is present.  Musculoskeletal:        General: Normal range of motion.     Cervical back: Neck supple.     Right lower leg: No edema.     Left lower leg: No edema.  Lymphadenopathy:     Cervical: No cervical adenopathy.  Skin:    General: Skin is warm and dry.  Neurological:     General: No focal deficit present.     Mental Status: He is alert. Mental status is at baseline.  Psychiatric:        Attention and Perception: Attention and perception normal.        Mood and Affect: Mood is depressed. Mood is not anxious or elated. Affect is flat. Affect is not blunt, angry or tearful.        Speech: Speech normal.        Behavior: Behavior normal. Behavior is cooperative.        Thought Content: Thought content normal. Thought content is not paranoid or delusional. Thought content does not include homicidal or suicidal ideation.        Cognition and Memory: Cognition normal.     Lab Results  Component Value Date   WBC 6.5 10/10/2022   HGB 14.3 10/10/2022   HCT 44.0 10/10/2022   PLT 213 10/10/2022   GLUCOSE 81 10/10/2022   CHOL 145 10/10/2022   TRIG 156 (H) 10/10/2022   HDL 54 10/10/2022   LDLDIRECT 74 02/22/2021   LDLCALC 65 10/10/2022   ALT 19 11/16/2022   AST 20 11/16/2022   NA 141 10/10/2022   K 4.1 10/10/2022   CL 106 10/10/2022   CREATININE 0.85 10/10/2022   BUN 13 10/10/2022   CO2 22 10/10/2022   TSH 1.060 10/10/2022    PSA 0.6 10/10/2022   INR 1.1 05/07/2019   HGBA1C 5.3 05/07/2019    No results found.  Assessment & Plan:   Current severe episode of major depressive disorder without psychotic features without prior episode (HCC) -     buPROPion HCl ER (XL); Take 1 tablet (150 mg total) by mouth daily.  Dispense: 90 tablet; Refill: 1 -     Vortioxetine HBr; Take 1 tablet (5 mg total) by mouth daily.  Dispense: 90 tablet; Refill: 1  Need for vaccination -     Tdap vaccine greater than or equal to 7yo IM  Follow-up: Return in about 6 months (around 08/16/2023).  Sanda Linger, MD

## 2023-02-16 NOTE — Patient Instructions (Signed)

## 2023-02-23 ENCOUNTER — Encounter: Payer: Self-pay | Admitting: Internal Medicine

## 2023-02-23 ENCOUNTER — Other Ambulatory Visit: Payer: Self-pay | Admitting: Internal Medicine

## 2023-02-23 DIAGNOSIS — R931 Abnormal findings on diagnostic imaging of heart and coronary circulation: Secondary | ICD-10-CM | POA: Insufficient documentation

## 2023-02-23 DIAGNOSIS — E785 Hyperlipidemia, unspecified: Secondary | ICD-10-CM

## 2023-02-23 MED ORDER — PITAVASTATIN CALCIUM 1 MG PO TABS: 1.0000 mg | ORAL_TABLET | Freq: Every day | ORAL | 0 refills | Status: DC

## 2023-02-23 NOTE — Progress Notes (Unsigned)
Subjective:  Patient ID: Harry Nash, male    DOB: 20-Jun-1955  Age: 68 y.o. MRN: [id number]  CC: No chief complaint on file.   HPI Lanard Adamczyk Ryker presents for ***  Outpatient Medications Prior to Visit  Medication Sig Dispense Refill   buPROPion (WELLBUTRIN XL) 150 MG 24 hr tablet Take 1 tablet (150 mg total) by mouth daily. 90 tablet 1   clopidogrel (PLAVIX) 75 MG tablet TAKE 1 TABLET (75 MG) BY MOUTH DAILY, need to schedule appointment with PCP 90 tablet 0   LORazepam (ATIVAN) 0.5 MG tablet TAKE ONE TABLET BY MOUTH TWICE DAILY AS NEEDED FOR ANXIETY 45 tablet 0   metoprolol tartrate (LOPRESSOR) 25 MG tablet Take 0.5 tablets (12.5 mg total) by mouth 2 (two) times daily as needed. For heart rate >100 30 tablet 3   REPATHA SURECLICK 140 MG/ML SOAJ Inject 140 mg into the skin every 14 (fourteen) days. 6 mL 3   vortioxetine HBr (TRINTELLIX) 5 MG TABS tablet Take 1 tablet (5 mg total) by mouth daily. 90 tablet 1   No facility-administered medications prior to visit.    ROS Review of Systems  Objective:  There were no vitals taken for this visit.  BP Readings from Last 3 Encounters:  02/16/23 126/86  12/20/22 132/76  11/16/22 126/80    Wt Readings from Last 3 Encounters:  02/16/23 193 lb 12.8 oz (87.9 kg)  12/20/22 193 lb 4.8 oz (87.7 kg)  11/16/22 194 lb (88 kg)    Physical Exam  Lab Results  Component Value Date   WBC 6.5 10/10/2022   HGB 14.3 10/10/2022   HCT 44.0 10/10/2022   PLT 213 10/10/2022   GLUCOSE 81 10/10/2022   CHOL 145 10/10/2022   TRIG 156 (H) 10/10/2022   HDL 54 10/10/2022   LDLDIRECT 74 02/22/2021   LDLCALC 65 10/10/2022   ALT 19 11/16/2022   AST 20 11/16/2022   NA 141 10/10/2022   K 4.1 10/10/2022   CL 106 10/10/2022   CREATININE 0.85 10/10/2022   BUN 13 10/10/2022   CO2 22 10/10/2022   TSH 1.060 10/10/2022   PSA 0.6 10/10/2022   INR 1.1 05/07/2019   HGBA1C 5.3 05/07/2019    CT CARDIAC SCORING ([facility] LOCATIONS ONLY) Result Date:  02/23/2023 CLINICAL DATA:  68 year old Caucasian male former smoker with family history of coronary artery disease. * Tracking Code: FCC * EXAM: CT CARDIAC CORONARY ARTERY CALCIUM SCORE TECHNIQUE: Non-contrast imaging through the heart was performed using prospective ECG gating. Image post processing was performed on an independent workstation, allowing for quantitative analysis of the heart and coronary arteries. Note that this exam targets the heart and the chest was not imaged in its entirety. COMPARISON:  No priors. FINDINGS: CORONARY CALCIUM SCORES: Left Main: 0 LAD: 468 LCx: 190 RCA/PDA: 0 Total Agatston Score: 658 MESA database percentile: 83rd AORTA MEASUREMENTS: Ascending Aorta: 3.8 cm Descending Aorta:3.0 cm OTHER FINDINGS: Within the visualized portions of the thorax there are no suspicious appearing pulmonary nodules or masses, there is no acute consolidative airspace disease, no pleural effusions, no pneumothorax and no lymphadenopathy. Visualized portions of the upper abdomen demonstrates diffuse low attenuation throughout the visualized hepatic parenchyma, indicative of a background of hepatic steatosis. There is also an exophytic low-attenuation lesion measuring 3.5 cm extending off the upper pole of the right kidney, incompletely characterized on today's noncontrast CT examination, but statistically likely a cyst (no imaging follow-up recommended). There are no aggressive appearing lytic or blastic lesions  noted in the visualized portions of the skeleton. IMPRESSION: 1. Patient's total coronary artery calcium score is 658 which is 83rd percentile for patient's of matched age, gender and race/ethnicity. Please note that although the presence of coronary artery calcium documents the presence of coronary artery disease, the severity of this disease and any potential stenosis cannot be assessed on this noncontrast CT examination. Assessment for potential risk factor modification, dietary therapy or  pharmacologic therapy may be warranted, if clinically indicated. 2.  Aortic Atherosclerosis (ICD10-I70.0). 3. Hepatic steatosis. Electronically Signed   By: Trudie Reed M.D.   On: 02/23/2023 13:13    Assessment & Plan:  Agatston CAC score, >400     Follow-up: No follow-ups on file.  Sanda Linger, MD

## 2023-02-27 ENCOUNTER — Other Ambulatory Visit: Payer: Self-pay | Admitting: Family

## 2023-02-28 ENCOUNTER — Encounter (HOSPITAL_BASED_OUTPATIENT_CLINIC_OR_DEPARTMENT_OTHER): Payer: Self-pay

## 2023-03-07 ENCOUNTER — Encounter (HOSPITAL_COMMUNITY): Payer: Self-pay

## 2023-03-07 ENCOUNTER — Telehealth (HOSPITAL_COMMUNITY): Payer: Self-pay | Admitting: Emergency Medicine

## 2023-03-07 NOTE — Telephone Encounter (Signed)
Reaching out to patient to offer assistance regarding upcoming cardiac imaging study; pt verbalizes understanding of appt date/time, parking situation and where to check in, pre-test NPO status and medications ordered, and verified current allergies; name and call back number provided for further questions should they arise Rockwell Alexandria RN Navigator Cardiac Imaging [facility] [phone] office [phone] cell  Pt will take 4 tablets of metoprolol that he has at home 2 hr prior to test

## 2023-03-08 ENCOUNTER — Encounter: Payer: Self-pay | Admitting: Internal Medicine

## 2023-03-08 ENCOUNTER — Ambulatory Visit (HOSPITAL_COMMUNITY)
Admission: RE | Admit: 2023-03-08 | Discharge: 2023-03-08 | Disposition: A | Discharge status: Home / Self Care | Payer: 59 | Source: Ambulatory Visit | Attending: Cardiology

## 2023-03-08 ENCOUNTER — Telehealth: Payer: Self-pay

## 2023-03-08 ENCOUNTER — Other Ambulatory Visit: Payer: Self-pay | Admitting: Cardiology

## 2023-03-08 ENCOUNTER — Ambulatory Visit (HOSPITAL_COMMUNITY)
Admission: RE | Admit: 2023-03-08 | Discharge: 2023-03-08 | Disposition: A | Discharge status: Home / Self Care | Payer: 59 | Source: Ambulatory Visit | Attending: Internal Medicine | Admitting: Internal Medicine

## 2023-03-08 ENCOUNTER — Other Ambulatory Visit: Payer: Self-pay | Admitting: Internal Medicine

## 2023-03-08 DIAGNOSIS — I2510 Atherosclerotic heart disease of native coronary artery without angina pectoris: Secondary | ICD-10-CM | POA: Insufficient documentation

## 2023-03-08 DIAGNOSIS — R931 Abnormal findings on diagnostic imaging of heart and coronary circulation: Secondary | ICD-10-CM

## 2023-03-08 DIAGNOSIS — K760 Fatty (change of) liver, not elsewhere classified: Secondary | ICD-10-CM | POA: Diagnosis not present

## 2023-03-08 DIAGNOSIS — M47814 Spondylosis without myelopathy or radiculopathy, thoracic region: Secondary | ICD-10-CM | POA: Insufficient documentation

## 2023-03-08 DIAGNOSIS — R079 Chest pain, unspecified: Secondary | ICD-10-CM | POA: Diagnosis not present

## 2023-03-08 MED ORDER — NITROGLYCERIN 0.4 MG SL SUBL
SUBLINGUAL_TABLET | SUBLINGUAL | Status: AC
Start: 2023-03-08 — End: ?
  Filled 2023-03-08: qty 2

## 2023-03-08 MED ORDER — NITROGLYCERIN 0.4 MG SL SUBL
0.8000 mg | SUBLINGUAL_TABLET | Freq: Once | SUBLINGUAL | Status: AC
  Administered 2023-03-08: 0.8 mg via SUBLINGUAL

## 2023-03-08 MED ORDER — IOHEXOL 350 MG/ML SOLN
95.0000 mL | Freq: Once | INTRAVENOUS | Status: AC | PRN
  Administered 2023-03-08: 95 mL via INTRAVENOUS

## 2023-03-08 NOTE — Telephone Encounter (Signed)
-----   Message from [facility] sent at 03/08/2023  4:55 PM EST ----- Regarding: FW: NEEDS an appt STAT  ----- Message ----- From: Michele Richardson, DO Sent: 03/08/2023   4:47 PM EST To: Debby LITTIE Molt, MD; Shelda Bruckner, MD Subject: NEEDS an appt STAT                             To All,   Patient had a CCTA ordered by Dr. Molt and is noted to obstructive disease in the proximal LAD which is FFR positive.   He needs to be seen by a cardiologist within 24hrs (DOD either at [facility] or [facility]) to evaluate for symptoms and discuss cath.   Bruckner - just FISERV. You last saw him in 2023 but he has been seen by APPs. Keeping you in the loop.   Please let us  know when this is arranged.   Sunit Eldridge, DO, FACC

## 2023-03-08 NOTE — Telephone Encounter (Signed)
 Spoke with patient and he is aware of appointment for tomorrow.

## 2023-03-09 ENCOUNTER — Ambulatory Visit: Payer: 59 | Attending: Cardiology | Admitting: Cardiology

## 2023-03-09 ENCOUNTER — Encounter: Payer: Self-pay | Admitting: Cardiology

## 2023-03-09 VITALS — BP 118/72 | HR 71 | Resp 16 | Ht 71.0 in | Wt 190.0 lb

## 2023-03-09 DIAGNOSIS — I6523 Occlusion and stenosis of bilateral carotid arteries: Secondary | ICD-10-CM

## 2023-03-09 DIAGNOSIS — I2510 Atherosclerotic heart disease of native coronary artery without angina pectoris: Secondary | ICD-10-CM | POA: Diagnosis not present

## 2023-03-09 DIAGNOSIS — R931 Abnormal findings on diagnostic imaging of heart and coronary circulation: Secondary | ICD-10-CM

## 2023-03-09 DIAGNOSIS — E7800 Pure hypercholesterolemia, unspecified: Secondary | ICD-10-CM

## 2023-03-09 DIAGNOSIS — I351 Nonrheumatic aortic (valve) insufficiency: Secondary | ICD-10-CM

## 2023-03-09 DIAGNOSIS — R0609 Other forms of dyspnea: Secondary | ICD-10-CM

## 2023-03-09 DIAGNOSIS — E785 Hyperlipidemia, unspecified: Secondary | ICD-10-CM

## 2023-03-09 MED ORDER — NITROGLYCERIN 0.4 MG SL SUBL: 0.4000 mg | SUBLINGUAL_TABLET | SUBLINGUAL | 3 refills | Status: DC | PRN

## 2023-03-09 MED ORDER — ASPIRIN 81 MG PO TBEC: 81.0000 mg | DELAYED_RELEASE_TABLET | Freq: Every day | ORAL | 3 refills | Status: AC

## 2023-03-09 NOTE — Progress Notes (Signed)
 Cardiology Office Note:   Date:  03/09/2023  ID:  Harry Nash, DOB [birth year], MRN [id number] PCP: [name], MD  [facility] Providers Cardiologist:  Shelda Bruckner, MD    History of Present Illness:   Discussed the use of AI scribe software for clinical note transcription with the patient, who gave verbal consent to proceed.  Harry Nash is a 68 year old male with palpitations, transient ischemic attack, carotid artery stenosis, and hyperlipidemia who presents for acute visit after an abnormal cardiac CTA.  He was most recently seen by cardiology in November following a workup in September for palpitations, dizziness, and exertional dyspnea. At that time, he underwent a carotid duplex ultrasound and a 30-day monitor. The echocardiogram showed normal ejection fraction with mild aortic insufficiency. The carotid artery duplex revealed mild right ICA stenosis and moderate left ICA stenosis. The 30-day monitor showed sinus rhythm with no significant arrhythmias.  Also in November, patient's PCP ordered a cardiac calcium  score CT but he did not receive results until 02/23/23. This CT showed calcium  score of 658, aortic atherosclerosis. Given elevated score, cardiac CTA with FFR reflex ordered. This found severe stenosis (70-99%) due to mixed plaque at the proximal LAD, moderate stenosis (50-69%) due to mixed plaque at ostial LCX, RCA with mild stenosis (25-49%) due to soft plaque at mid RCA. FFR of LAD: Proximal FFR = 0.99, mid FFR = 0.64, distal FFR = 0.58.   Today patient reports that he has continued to experience some dyspnea, particularly during activities such as biking or walking up stairs, and sometimes feels out of breath during minimal exertion. No chest discomfort is noted.  He is currently on pitavastatin , which he has been tolerating well, despite a history of myalgias with other statins, including rosuvastatin . He is also on Repatha  and Plavix , the  latter primarily for TIA-related reasons. He does not currently take aspirin .  He is concerned about the delay in receiving results from a CT scan ordered in November, which he only received three days ago.  He has a significant family history of cardiovascular disease, which he acknowledges as a genetic predisposition.      Today patient denies chest pain, lower extremity edema, fatigue, palpitations, melena, hematuria, hemoptysis, diaphoresis, weakness, presyncope, syncope, orthopnea, and PND.   Studies Reviewed:    Cardiac CTA with FFR:  FINDINGS: Image quality: Average.   Artifact: Limited.   Coronary artery calcification score:   Left main: 0   Left anterior descending artery: 458   Left circumflex artery: 200   Right coronary artery: 0.916   Total coronary calcium  score of 659, places the patient at the 83rd percentile for age and sex matched control.   Coronary arteries: Normal coronary origins.  Right dominance.   Left Main Coronary Artery: Normal caliber vessel, originates from the left coronary cusp, bifurcates into a left anterior descending artery (LAD) and left circumflex artery (LCX). Left main is patent.   Left Anterior Descending Artery: Normal caliber vessel, reaches the apex, gives off 4 diagonal branches. Severe stenosis (70-99%) due to mixed plaque at the proximal LAD. Minimal calcified plaque burden within the mid LAD. Distal to apical segments are patent.   First Diagonal branch: Normal size and caliber vessel, minimal calcified plaque (0-24%) at the ostium otherwise vessel is patent.   Second Diagonal branch: Small, patent   Third Diagonal branch: Small, patent   Fourth Diagonal branch: Small, patent   Left Circumflex Artery: Normal caliber vessel, non-dominant,  travels within the atrioventricular groove, gives off 2 obtuse marginal branches. Moderate stenosis (50-69%) due to mixed plaque at the ostial LCX. Mild stenosis (25-49%) due to  calcified plaque at the mid LCX.   First Obtuse Marginal branch: Patent.   Second Obtuse Marginal branch: Normal caliber, large in size, patent.   Right Coronary Artery: Dominant vessel, originates from the right coronary cusp, terminates as a PDA and right posterolateral branch. Mild stenosis (25-49%) due to soft plaque at the mid RCA.   Plaque Analysis:   Calcified plaque 134 mm3   Non-calcified plaque 459 mm3 (low attenuation plaque 17 mm3).   Total plaque volume is 593 mm3, categorized as severe plaque burden, which is 51st percentile for age- and sex-matched controls.   Left Atrium: Grossly normal in size with no left atrial appendage filling defect. Patent foramen ovale.   Left Ventricle: Grossly normal in size. There is no abnormal filling defect.   Pulmonary arteries: Normal in size without proximal filling defect.   Pulmonary veins: Normal pulmonary venous drainage.   Aorta: Normal size, 35 mm at the mid ascending aorta (level of the PA bifurcation) measured double oblique. Aortic atherosclerosis. No dissection.   Pericardium: Normal thickness with no significant effusion or calcium  present.   Aortic Valve:Trileaflet without significant calcification.   Mitral Valve: Normal structure without significant calcification.   Extra-cardiac findings: See attached radiology report for non-cardiac structures.   IMPRESSION: 1. Total coronary calcium  score of 659, places the patient at the 83rd percentile for age and sex matched control.   2. Total plaque volume is 593 mm3, categorized as severe plaque burden, which is 51st percentile for age- and sex-matched controls.   3.  Normal coronary origin with right dominance.   4. CAD-RADS = 4 Severe coronary artery disease, summarized findings noted below.   Left Main: Patent.   LAD: Severe stenosis (70-99%) due to mixed plaque at the proximal LAD.   LCX: Moderate stenosis (50-69%) due to mixed plaque at the  ostial LCX.   RCA: Mild stenosis (25-49%) due to soft plaque at the mid RCA.   5.  Aortic atherosclerosis.   6.  Patent foramen ovale.   7. Study is sent for CT-FFR to further evaluate LAD and LCx . Findings will be performed and reported separately.   RECOMMENDATIONS:   Will send the study to CT-FFR for further evaluation.   Consider symptom-guided anti-ischemic pharmacotherapy as well as risk factor modification per guideline directed care.   Invasive coronary angiography recommended with revascularization per published guideline statements if either CT-FFR notes hemodynamically significant stenosis or continues to have symptoms despite up titration of medical therapy.   FINDINGS: FFRct analysis was performed on the original cardiac CT angiogram dataset. Diagrammatic representation of the FFRct analysis is provided in a separate PDF document in PACS. This dictation was created using the PDF document and an interactive 3D model of the results. 3D model is not available in the EMR/PACS. Normal FFR range is >0.80. Indeterminate (grey) zone is 0.76-0.80.   1. Left Main: FFR = 0.99   2. LAD: Proximal FFR = 0.99, mid FFR = 0.64, distal FFR = 0.58 3. D1: Proximal FFR = 0.95, distal FFR = not modeled due to small vessel size 4. LCX: Proximal FFR = 0.98, distal FFR = 0.92 5. RCA: Proximal FFR = 0.99, mid FFR =0.90, Distal FFR = 0.87   IMPRESSION: 1. CT FFR analysis shows hemodynamically significant stenosis at the proximal LAD.   RECOMMENDATIONS: Goal  directed medical therapy and aggressive risk factor modification for secondary prevention of coronary artery disease.   Up titration of anti-anginal treatment.   Invasive angiography recommended.   Risk Assessment/Calculations:              Physical Exam:   VS:  BP 118/72 (BP Location: Right Arm, Patient Position: Sitting)   Pulse 71   Resp 16   Ht 5' 11 (1.803 m)   Wt 190 lb (86.2 kg)   SpO2 97%   BMI 26.50  kg/m    Wt Readings from Last 3 Encounters:  03/09/23 190 lb (86.2 kg)  02/16/23 193 lb 12.8 oz (87.9 kg)  12/20/22 193 lb 4.8 oz (87.7 kg)     Physical Exam Vitals reviewed.  Constitutional:      Appearance: Normal appearance.  HENT:     Head: Normocephalic.  Eyes:     Pupils: Pupils are equal, round, and reactive to light.  Cardiovascular:     Rate and Rhythm: Normal rate and regular rhythm.     Pulses: Normal pulses.     Heart sounds: Normal heart sounds.  Pulmonary:     Effort: Pulmonary effort is normal.     Breath sounds: Normal breath sounds.  Abdominal:     General: Abdomen is flat.     Palpations: Abdomen is soft.  Musculoskeletal:     Right lower leg: No edema.     Left lower leg: No edema.  Skin:    General: Skin is warm and dry.     Capillary Refill: Capillary refill takes less than 2 seconds.  Neurological:     General: No focal deficit present.     Mental Status: He is alert and oriented to person, place, and time.  Psychiatric:        Mood and Affect: Mood normal.        Behavior: Behavior normal.        Thought Content: Thought content normal.        Judgment: Judgment normal.     ASSESSMENT AND PLAN:       Coronary Artery Disease Recent cardiac CTA ordered due to elevated Ca score of 658. Subsequent CT coronary angiogram on 03/08/23 revealed significant stenosis in the proximal left anterior descending artery (70-99%) and mild to moderate disease in the right coronary artery and circumflex artery. LAD FFR: Proximal FFR = 0.99, mid FFR = 0.64, distal FFR = 0.58. Patient reports exertional shortness of breath. October TTE with normal LVEF. Dyspnea on exertion likely patient's anginal equivalent. Discussed the need for further evaluation via left heart catheterization and potential stent placement. -Schedule left heart catheterization for next Tuesday. -Start 81mg  ASA in addition to daily Plavix  75mg .  -Order nitroglycerin  for patient to have on  hand. -Check LDL today to assess current level on pitavastatin . -Patient currently with Rx for PRN metoprolol  for palpitations. Given low normal BP, will defer conversion to daily dose at this time.  -Present to ED with any acute chest discomfort or dyspnea.   Palpitations Patient s/p 30 day heart monitor in 2024 that did not reveal significant arrhythmia. No recent symptoms -Continue PRN Metoprolol   Hyperlipidemia Currently on pitavastatin  and Repatha . LDL last checked in September was 65. Discussed goal of LDL <55 given significant coronary artery disease. -Continue pitavastatin  and Repatha . -Check LDL today to assess current level on pitavastatin .  Transient Ischemic Attack On Plavix  for secondary prevention. Discussed potential need for dual antiplatelet therapy post-stent placement. -Continue Plavix  daily.  ASA added as above. -Consider switch to more potent P2Y12 inhibitor post-catheterization if stent placed.  Mild aortic regurgitation Noted on October 2024 TTE. No murmur on exam. -Continue to monitor symptoms for signs of progression.   Carotid Artery Stenosis Mild right ICA stenosis and moderate left ICA stenosis on carotid duplex ultrasound. -Continue current management.  Follow-up after catheterization to discuss results and further management.         Informed Consent   Shared Decision Making/Informed Consent The risks [stroke (1 in 1000), death (1 in 1000), kidney failure [usually temporary] (1 in 500), bleeding (1 in 200), allergic reaction [possibly serious] (1 in 200)], benefits (diagnostic support and management of coronary artery disease) and alternatives of a cardiac catheterization were discussed in detail with Mr. Hamme and he is willing to proceed.      Signed, Artist Pouch, PA-C

## 2023-03-09 NOTE — Patient Instructions (Signed)
 Medication Instructions:  START Aspirin  81 mg once daily START Nitroglycerin  - Dissolve 1 tablet under the tongue every 5 minutes as needed for chest pain  *If you need a refill on your cardiac medications before your next appointment, please call your pharmacy*   Lab Work: CBC, BMET, LDL direct-  Today - please have this completed at [facility] on the first floor of our building If you have labs (blood work) drawn today and your tests are completely normal, you will receive your results only by: MyChart Message (if you have MyChart) OR A paper copy in the mail If you have any lab test that is abnormal or we need to change your treatment, we will call you to review the results.   Testing/Procedures: Heart Catheterization - see instructions below Your physician has requested that you have a cardiac catheterization. Cardiac catheterization is used to diagnose and/or treat various heart conditions. Doctors may recommend this procedure for a number of different reasons. The most common reason is to evaluate chest pain. Chest pain can be a symptom of coronary artery disease (CAD), and cardiac catheterization can show whether plaque is narrowing or blocking your heart's arteries. This procedure is also used to evaluate the valves, as well as measure the blood flow and oxygen levels in different parts of your heart. For further information please visit [email or web address] Please follow instruction sheet, as given.     Follow-Up: At [facility], you and your health needs are our priority.  As part of our continuing mission to provide you with exceptional heart care, we have created designated Provider Care Teams.  These Care Teams include your primary Cardiologist (physician) and Advanced Practice Providers (APPs -  Physician Assistants and Nurse Practitioners) who all work together to provide you with the care you need, when you need it.  We recommend signing up for the patient portal called  MyChart.  Sign up information is provided on this After Visit Summary.  MyChart is used to connect with patients for Virtual Visits (Telemedicine).  Patients are able to view lab/test results, encounter notes, upcoming appointments, etc.  Non-urgent messages can be sent to your provider as well.   To learn more about what you can do with MyChart, go to [email or web address].    Your next appointment:   1 month(s)  Provider:   Artist Pouch, PA    [facility] A DEPT OF [facility] AT [facility] [address] [place], [address] [place] [address] Dept: [phone] Loc: [phone]  Harry Nash  03/09/2023  You are scheduled for a Cardiac Catheterization on Tuesday, February 11 with Dr.  [place] .  1. Please arrive at the [facility] (Main Entrance A) at [facility]: [address] [place], [place] [address] at 7:00 AM (This time is 2 hour(s) before your procedure to ensure your preparation).   Free valet parking service is available. You will check in at ADMITTING. The support person will be asked to wait in the waiting room.  It is OK to have someone drop you off and come back when you are ready to be discharged.    Special note: Every effort is made to have your procedure done on time. Please understand that emergencies sometimes delay scheduled procedures.  2. Diet: Do not eat solid foods after midnight.  The patient may have clear liquids until 5am upon the day of the procedure.  3. Labs: You will need to have  blood drawn today at [facility].  You do not need to be fasting.  4. Medication instructions in preparation for your procedure:  On the morning of your procedure, take your Aspirin  81 mg and Plavix /Clopidogrel  and any morning medicines NOT listed above.  You may use sips of water.  5. Plan to go home the same day, you will only stay overnight if medically necessary. 6. Bring a current list of your  medications and current insurance cards. 7. You MUST have a responsible person to drive you home. 8. Someone MUST be with you the first 24 hours after you arrive home or your discharge will be delayed. 9. Please wear clothes that are easy to get on and off and wear slip-on shoes.  Thank you for allowing us  to care for you!   -- [facility]

## 2023-03-09 NOTE — H&P (View-Only) (Signed)
 Cardiology Office Note:   Date:  03/09/2023  ID:  Harry Nash, DOB [birth year], MRN [id number] PCP: [name], MD  [facility] Providers Cardiologist:  Shelda Bruckner, MD    History of Present Illness:   Discussed the use of AI scribe software for clinical note transcription with the patient, who gave verbal consent to proceed.  Harry Nash is a 68 year old male with palpitations, transient ischemic attack, carotid artery stenosis, and hyperlipidemia who presents for acute visit after an abnormal cardiac CTA.  He was most recently seen by cardiology in November following a workup in September for palpitations, dizziness, and exertional dyspnea. At that time, he underwent a carotid duplex ultrasound and a 30-day monitor. The echocardiogram showed normal ejection fraction with mild aortic insufficiency. The carotid artery duplex revealed mild right ICA stenosis and moderate left ICA stenosis. The 30-day monitor showed sinus rhythm with no significant arrhythmias.  Also in November, patient's PCP ordered a cardiac calcium  score CT but he did not receive results until 02/23/23. This CT showed calcium  score of 658, aortic atherosclerosis. Given elevated score, cardiac CTA with FFR reflex ordered. This found severe stenosis (70-99%) due to mixed plaque at the proximal LAD, moderate stenosis (50-69%) due to mixed plaque at ostial LCX, RCA with mild stenosis (25-49%) due to soft plaque at mid RCA. FFR of LAD: Proximal FFR = 0.99, mid FFR = 0.64, distal FFR = 0.58.   Today patient reports that he has continued to experience some dyspnea, particularly during activities such as biking or walking up stairs, and sometimes feels out of breath during minimal exertion. No chest discomfort is noted.  He is currently on pitavastatin , which he has been tolerating well, despite a history of myalgias with other statins, including rosuvastatin . He is also on Repatha  and Plavix , the  latter primarily for TIA-related reasons. He does not currently take aspirin .  He is concerned about the delay in receiving results from a CT scan ordered in November, which he only received three days ago.  He has a significant family history of cardiovascular disease, which he acknowledges as a genetic predisposition.      Today patient denies chest pain, lower extremity edema, fatigue, palpitations, melena, hematuria, hemoptysis, diaphoresis, weakness, presyncope, syncope, orthopnea, and PND.   Studies Reviewed:    Cardiac CTA with FFR:  FINDINGS: Image quality: Average.   Artifact: Limited.   Coronary artery calcification score:   Left main: 0   Left anterior descending artery: 458   Left circumflex artery: 200   Right coronary artery: 0.916   Total coronary calcium  score of 659, places the patient at the 83rd percentile for age and sex matched control.   Coronary arteries: Normal coronary origins.  Right dominance.   Left Main Coronary Artery: Normal caliber vessel, originates from the left coronary cusp, bifurcates into a left anterior descending artery (LAD) and left circumflex artery (LCX). Left main is patent.   Left Anterior Descending Artery: Normal caliber vessel, reaches the apex, gives off 4 diagonal branches. Severe stenosis (70-99%) due to mixed plaque at the proximal LAD. Minimal calcified plaque burden within the mid LAD. Distal to apical segments are patent.   First Diagonal branch: Normal size and caliber vessel, minimal calcified plaque (0-24%) at the ostium otherwise vessel is patent.   Second Diagonal branch: Small, patent   Third Diagonal branch: Small, patent   Fourth Diagonal branch: Small, patent   Left Circumflex Artery: Normal caliber vessel, non-dominant,  travels within the atrioventricular groove, gives off 2 obtuse marginal branches. Moderate stenosis (50-69%) due to mixed plaque at the ostial LCX. Mild stenosis (25-49%) due to  calcified plaque at the mid LCX.   First Obtuse Marginal branch: Patent.   Second Obtuse Marginal branch: Normal caliber, large in size, patent.   Right Coronary Artery: Dominant vessel, originates from the right coronary cusp, terminates as a PDA and right posterolateral branch. Mild stenosis (25-49%) due to soft plaque at the mid RCA.   Plaque Analysis:   Calcified plaque 134 mm3   Non-calcified plaque 459 mm3 (low attenuation plaque 17 mm3).   Total plaque volume is 593 mm3, categorized as severe plaque burden, which is 51st percentile for age- and sex-matched controls.   Left Atrium: Grossly normal in size with no left atrial appendage filling defect. Patent foramen ovale.   Left Ventricle: Grossly normal in size. There is no abnormal filling defect.   Pulmonary arteries: Normal in size without proximal filling defect.   Pulmonary veins: Normal pulmonary venous drainage.   Aorta: Normal size, 35 mm at the mid ascending aorta (level of the PA bifurcation) measured double oblique. Aortic atherosclerosis. No dissection.   Pericardium: Normal thickness with no significant effusion or calcium  present.   Aortic Valve:Trileaflet without significant calcification.   Mitral Valve: Normal structure without significant calcification.   Extra-cardiac findings: See attached radiology report for non-cardiac structures.   IMPRESSION: 1. Total coronary calcium  score of 659, places the patient at the 83rd percentile for age and sex matched control.   2. Total plaque volume is 593 mm3, categorized as severe plaque burden, which is 51st percentile for age- and sex-matched controls.   3.  Normal coronary origin with right dominance.   4. CAD-RADS = 4 Severe coronary artery disease, summarized findings noted below.   Left Main: Patent.   LAD: Severe stenosis (70-99%) due to mixed plaque at the proximal LAD.   LCX: Moderate stenosis (50-69%) due to mixed plaque at the  ostial LCX.   RCA: Mild stenosis (25-49%) due to soft plaque at the mid RCA.   5.  Aortic atherosclerosis.   6.  Patent foramen ovale.   7. Study is sent for CT-FFR to further evaluate LAD and LCx . Findings will be performed and reported separately.   RECOMMENDATIONS:   Will send the study to CT-FFR for further evaluation.   Consider symptom-guided anti-ischemic pharmacotherapy as well as risk factor modification per guideline directed care.   Invasive coronary angiography recommended with revascularization per published guideline statements if either CT-FFR notes hemodynamically significant stenosis or continues to have symptoms despite up titration of medical therapy.   FINDINGS: FFRct analysis was performed on the original cardiac CT angiogram dataset. Diagrammatic representation of the FFRct analysis is provided in a separate PDF document in PACS. This dictation was created using the PDF document and an interactive 3D model of the results. 3D model is not available in the EMR/PACS. Normal FFR range is >0.80. Indeterminate (grey) zone is 0.76-0.80.   1. Left Main: FFR = 0.99   2. LAD: Proximal FFR = 0.99, mid FFR = 0.64, distal FFR = 0.58 3. D1: Proximal FFR = 0.95, distal FFR = not modeled due to small vessel size 4. LCX: Proximal FFR = 0.98, distal FFR = 0.92 5. RCA: Proximal FFR = 0.99, mid FFR =0.90, Distal FFR = 0.87   IMPRESSION: 1. CT FFR analysis shows hemodynamically significant stenosis at the proximal LAD.   RECOMMENDATIONS: Goal  directed medical therapy and aggressive risk factor modification for secondary prevention of coronary artery disease.   Up titration of anti-anginal treatment.   Invasive angiography recommended.   Risk Assessment/Calculations:              Physical Exam:   VS:  BP 118/72 (BP Location: Right Arm, Patient Position: Sitting)   Pulse 71   Resp 16   Ht 5' 11 (1.803 m)   Wt 190 lb (86.2 kg)   SpO2 97%   BMI 26.50  kg/m    Wt Readings from Last 3 Encounters:  03/09/23 190 lb (86.2 kg)  02/16/23 193 lb 12.8 oz (87.9 kg)  12/20/22 193 lb 4.8 oz (87.7 kg)     Physical Exam Vitals reviewed.  Constitutional:      Appearance: Normal appearance.  HENT:     Head: Normocephalic.  Eyes:     Pupils: Pupils are equal, round, and reactive to light.  Cardiovascular:     Rate and Rhythm: Normal rate and regular rhythm.     Pulses: Normal pulses.     Heart sounds: Normal heart sounds.  Pulmonary:     Effort: Pulmonary effort is normal.     Breath sounds: Normal breath sounds.  Abdominal:     General: Abdomen is flat.     Palpations: Abdomen is soft.  Musculoskeletal:     Right lower leg: No edema.     Left lower leg: No edema.  Skin:    General: Skin is warm and dry.     Capillary Refill: Capillary refill takes less than 2 seconds.  Neurological:     General: No focal deficit present.     Mental Status: He is alert and oriented to person, place, and time.  Psychiatric:        Mood and Affect: Mood normal.        Behavior: Behavior normal.        Thought Content: Thought content normal.        Judgment: Judgment normal.     ASSESSMENT AND PLAN:       Coronary Artery Disease Recent cardiac CTA ordered due to elevated Ca score of 658. Subsequent CT coronary angiogram on 03/08/23 revealed significant stenosis in the proximal left anterior descending artery (70-99%) and mild to moderate disease in the right coronary artery and circumflex artery. LAD FFR: Proximal FFR = 0.99, mid FFR = 0.64, distal FFR = 0.58. Patient reports exertional shortness of breath. October TTE with normal LVEF. Dyspnea on exertion likely patient's anginal equivalent. Discussed the need for further evaluation via left heart catheterization and potential stent placement. -Schedule left heart catheterization for next Tuesday. -Start 81mg  ASA in addition to daily Plavix  75mg .  -Order nitroglycerin  for patient to have on  hand. -Check LDL today to assess current level on pitavastatin . -Patient currently with Rx for PRN metoprolol  for palpitations. Given low normal BP, will defer conversion to daily dose at this time.  -Present to ED with any acute chest discomfort or dyspnea.   Palpitations Patient s/p 30 day heart monitor in 2024 that did not reveal significant arrhythmia. No recent symptoms -Continue PRN Metoprolol   Hyperlipidemia Currently on pitavastatin  and Repatha . LDL last checked in September was 65. Discussed goal of LDL <55 given significant coronary artery disease. -Continue pitavastatin  and Repatha . -Check LDL today to assess current level on pitavastatin .  Transient Ischemic Attack On Plavix  for secondary prevention. Discussed potential need for dual antiplatelet therapy post-stent placement. -Continue Plavix  daily.  ASA added as above. -Consider switch to more potent P2Y12 inhibitor post-catheterization if stent placed.  Mild aortic regurgitation Noted on October 2024 TTE. No murmur on exam. -Continue to monitor symptoms for signs of progression.   Carotid Artery Stenosis Mild right ICA stenosis and moderate left ICA stenosis on carotid duplex ultrasound. -Continue current management.  Follow-up after catheterization to discuss results and further management.         Informed Consent   Shared Decision Making/Informed Consent The risks [stroke (1 in 1000), death (1 in 1000), kidney failure [usually temporary] (1 in 500), bleeding (1 in 200), allergic reaction [possibly serious] (1 in 200)], benefits (diagnostic support and management of coronary artery disease) and alternatives of a cardiac catheterization were discussed in detail with Harry Nash and he is willing to proceed.      Signed, Artist Pouch, PA-C

## 2023-03-10 LAB — CBC
Hematocrit: 46 % (ref 37.5–51.0)
Hemoglobin: 15.4 g/dL (ref 13.0–17.7)
MCH: 29.1 pg (ref 26.6–33.0)
MCHC: 33.5 g/dL (ref 31.5–35.7)
MCV: 87 fL (ref 79–97)
Platelets: 231 10*3/uL (ref 150–450)
RBC: 5.29 x10E6/uL (ref 4.14–5.80)
RDW: 12.6 % (ref 11.6–15.4)
WBC: 6.8 10*3/uL (ref 3.4–10.8)

## 2023-03-10 LAB — BASIC METABOLIC PANEL
BUN/Creatinine Ratio: 18 (ref 10–24)
BUN: 18 mg/dL (ref 8–27)
CO2: 23 mmol/L (ref 20–29)
Calcium: 9.7 mg/dL (ref 8.6–10.2)
Chloride: 105 mmol/L (ref 96–106)
Creatinine, Ser: 0.98 mg/dL (ref 0.76–1.27)
Glucose: 97 mg/dL (ref 70–99)
Potassium: 4.5 mmol/L (ref 3.5–5.2)
Sodium: 140 mmol/L (ref 134–144)
eGFR: 85 mL/min/{1.73_m2} (ref >59)

## 2023-03-10 LAB — LDL CHOLESTEROL, DIRECT: LDL Direct: 60 mg/dL (ref 0–99)

## 2023-03-14 ENCOUNTER — Other Ambulatory Visit: Payer: Self-pay

## 2023-03-14 ENCOUNTER — Telehealth: Payer: Self-pay | Admitting: *Deleted

## 2023-03-14 DIAGNOSIS — R931 Abnormal findings on diagnostic imaging of heart and coronary circulation: Secondary | ICD-10-CM

## 2023-03-14 DIAGNOSIS — E7800 Pure hypercholesterolemia, unspecified: Secondary | ICD-10-CM

## 2023-03-14 DIAGNOSIS — E785 Hyperlipidemia, unspecified: Secondary | ICD-10-CM

## 2023-03-14 NOTE — Telephone Encounter (Signed)
Cardiac Catheterization scheduled at [facility] for: Wednesday March 15, 2023 11:30 AM Arrival time [facility] Main Entrance A at: 9:30 AM  Nothing to eat after midnight prior to procedure, clear liquids until 5 AM day of procedure.  Medication instructions: -Usual morning medications can be taken with sips of water including aspirin 81 mg and Plavix 75 mg  Plan to go home the same day, you will only stay overnight if medically necessary.  You must have responsible adult to drive you home.  Someone must be with you the first 24 hours after you arrive home.  Reviewed procedure instructions with patient.

## 2023-03-15 ENCOUNTER — Other Ambulatory Visit: Payer: Self-pay

## 2023-03-15 ENCOUNTER — Encounter (HOSPITAL_COMMUNITY)
Admission: RE | Disposition: A | Discharge status: Home / Self Care | Payer: Self-pay | Source: Home / Self Care | Attending: Cardiovascular Disease

## 2023-03-15 ENCOUNTER — Ambulatory Visit (HOSPITAL_COMMUNITY)
Admission: RE | Admit: 2023-03-15 | Discharge: 2023-03-15 | Disposition: A | Discharge status: Home / Self Care | Payer: 59 | Attending: Cardiovascular Disease | Admitting: Cardiovascular Disease

## 2023-03-15 DIAGNOSIS — R931 Abnormal findings on diagnostic imaging of heart and coronary circulation: Secondary | ICD-10-CM | POA: Diagnosis present

## 2023-03-15 DIAGNOSIS — Z955 Presence of coronary angioplasty implant and graft: Secondary | ICD-10-CM | POA: Insufficient documentation

## 2023-03-15 DIAGNOSIS — R002 Palpitations: Secondary | ICD-10-CM | POA: Insufficient documentation

## 2023-03-15 DIAGNOSIS — Z79899 Other long term (current) drug therapy: Secondary | ICD-10-CM | POA: Insufficient documentation

## 2023-03-15 DIAGNOSIS — I2510 Atherosclerotic heart disease of native coronary artery without angina pectoris: Secondary | ICD-10-CM

## 2023-03-15 DIAGNOSIS — I6523 Occlusion and stenosis of bilateral carotid arteries: Secondary | ICD-10-CM | POA: Insufficient documentation

## 2023-03-15 DIAGNOSIS — Z7902 Long term (current) use of antithrombotics/antiplatelets: Secondary | ICD-10-CM | POA: Insufficient documentation

## 2023-03-15 DIAGNOSIS — I351 Nonrheumatic aortic (valve) insufficiency: Secondary | ICD-10-CM | POA: Insufficient documentation

## 2023-03-15 DIAGNOSIS — Z8249 Family history of ischemic heart disease and other diseases of the circulatory system: Secondary | ICD-10-CM | POA: Diagnosis not present

## 2023-03-15 DIAGNOSIS — Z8673 Personal history of transient ischemic attack (TIA), and cerebral infarction without residual deficits: Secondary | ICD-10-CM | POA: Diagnosis not present

## 2023-03-15 DIAGNOSIS — I25118 Atherosclerotic heart disease of native coronary artery with other forms of angina pectoris: Secondary | ICD-10-CM | POA: Diagnosis not present

## 2023-03-15 DIAGNOSIS — R079 Chest pain, unspecified: Secondary | ICD-10-CM | POA: Diagnosis present

## 2023-03-15 DIAGNOSIS — E785 Hyperlipidemia, unspecified: Secondary | ICD-10-CM | POA: Insufficient documentation

## 2023-03-15 DIAGNOSIS — Z7982 Long term (current) use of aspirin: Secondary | ICD-10-CM | POA: Insufficient documentation

## 2023-03-15 DIAGNOSIS — I2584 Coronary atherosclerosis due to calcified coronary lesion: Secondary | ICD-10-CM | POA: Diagnosis not present

## 2023-03-15 HISTORY — PX: CORONARY ULTRASOUND/IVUS: CATH118244

## 2023-03-15 HISTORY — PX: CORONARY STENT INTERVENTION: CATH118234

## 2023-03-15 HISTORY — PX: LEFT HEART CATH AND CORONARY ANGIOGRAPHY: CATH118249

## 2023-03-15 LAB — POCT ACTIVATED CLOTTING TIME
Activated Clotting Time: 250 s
Activated Clotting Time: 268 s
Activated Clotting Time: 233 s

## 2023-03-15 SURGERY — LEFT HEART CATH AND CORONARY ANGIOGRAPHY
Anesthesia: LOCAL

## 2023-03-15 MED ORDER — ONDANSETRON HCL 4 MG/2ML IJ SOLN
4.0000 mg | Freq: Four times a day (QID) | INTRAMUSCULAR | Status: DC | PRN
Start: 2023-03-15 — End: 2023-03-16

## 2023-03-15 MED ORDER — MIDAZOLAM HCL 2 MG/2ML IJ SOLN
INTRAMUSCULAR | Status: DC | PRN
Start: 2023-03-15 — End: 2023-03-16
  Administered 2023-03-15 (×2): 2 mg via INTRAVENOUS
  Administered 2023-03-15: 1 mg via INTRAVENOUS

## 2023-03-15 MED ORDER — DIAZEPAM 5 MG PO TABS: 5.0000 mg | ORAL_TABLET | ORAL | Status: DC | PRN

## 2023-03-15 MED ORDER — HYDRALAZINE HCL 20 MG/ML IJ SOLN: 10.0000 mg | INTRAMUSCULAR | Status: DC | PRN

## 2023-03-15 MED ORDER — FAMOTIDINE IN NACL 20-0.9 MG/50ML-% IV SOLN
INTRAVENOUS | Status: DC
Start: 2023-03-15 — End: 2023-03-16
  Filled 2023-03-15: qty 50

## 2023-03-15 MED ORDER — SODIUM CHLORIDE 0.9% FLUSH: 3.0000 mL | INTRAVENOUS | Status: DC | PRN

## 2023-03-15 MED ORDER — SODIUM CHLORIDE 0.9 % IV SOLN: 250.0000 mL | INTRAVENOUS | Status: DC | PRN

## 2023-03-15 MED ORDER — VERAPAMIL HCL 2.5 MG/ML IV SOLN
INTRAVENOUS | Status: DC | PRN
  Administered 2023-03-15: 10 mL via INTRA_ARTERIAL

## 2023-03-15 MED ORDER — HEPARIN (PORCINE) IN NACL 1000-0.9 UT/500ML-% IV SOLN
INTRAVENOUS | Status: DC | PRN
  Administered 2023-03-15 (×3): 500 mL

## 2023-03-15 MED ORDER — VERAPAMIL HCL 2.5 MG/ML IV SOLN
INTRAVENOUS | Status: AC
  Filled 2023-03-15: qty 2

## 2023-03-15 MED ORDER — LIDOCAINE HCL (PF) 1 % IJ SOLN
INTRAMUSCULAR | Status: DC | PRN
  Administered 2023-03-15: 2 mL

## 2023-03-15 MED ORDER — FENTANYL CITRATE (PF) 100 MCG/2ML IJ SOLN
INTRAMUSCULAR | Status: DC | PRN
  Administered 2023-03-15: 50 ug via INTRAVENOUS
  Administered 2023-03-15 (×2): 25 ug via INTRAVENOUS

## 2023-03-15 MED ORDER — NITROGLYCERIN 1 MG/10 ML FOR IR/CATH LAB
INTRA_ARTERIAL | Status: DC | PRN
  Administered 2023-03-15: 150 ug via INTRACORONARY
  Administered 2023-03-15: 200 ug via INTRACORONARY
  Administered 2023-03-15: 150 ug via INTRACORONARY

## 2023-03-15 MED ORDER — MIDAZOLAM HCL 2 MG/2ML IJ SOLN
INTRAMUSCULAR | Status: AC
  Filled 2023-03-15: qty 2

## 2023-03-15 MED ORDER — LABETALOL HCL 5 MG/ML IV SOLN: 10.0000 mg | INTRAVENOUS | Status: DC | PRN

## 2023-03-15 MED ORDER — ASPIRIN 81 MG PO CHEW: 81.0000 mg | CHEWABLE_TABLET | ORAL | Status: DC

## 2023-03-15 MED ORDER — HEPARIN SODIUM (PORCINE) 1000 UNIT/ML IJ SOLN
INTRAMUSCULAR | Status: AC
  Filled 2023-03-15: qty 10

## 2023-03-15 MED ORDER — ATROPINE SULFATE 1 MG/10ML IJ SOSY
PREFILLED_SYRINGE | INTRAMUSCULAR | Status: AC
Start: 2023-03-15 — End: ?
  Filled 2023-03-15: qty 10

## 2023-03-15 MED ORDER — CLOPIDOGREL BISULFATE 300 MG PO TABS
ORAL_TABLET | ORAL | Status: DC | PRN
  Administered 2023-03-15: 600 mg via ORAL

## 2023-03-15 MED ORDER — LIDOCAINE HCL (CARDIAC) PF 100 MG/5ML IV SOSY
PREFILLED_SYRINGE | INTRAVENOUS | Status: AC
  Filled 2023-03-15: qty 5

## 2023-03-15 MED ORDER — SODIUM CHLORIDE 0.9 % IV SOLN: INTRAVENOUS | Status: AC

## 2023-03-15 MED ORDER — CLOPIDOGREL BISULFATE 300 MG PO TABS
ORAL_TABLET | ORAL | Status: AC
  Filled 2023-03-15: qty 2

## 2023-03-15 MED ORDER — SODIUM CHLORIDE 0.9% FLUSH: 3.0000 mL | Freq: Two times a day (BID) | INTRAVENOUS | Status: DC

## 2023-03-15 MED ORDER — SODIUM CHLORIDE 0.9 % WEIGHT BASED INFUSION: 3.0000 mL/kg/h | INTRAVENOUS | Status: AC

## 2023-03-15 MED ORDER — HEPARIN SODIUM (PORCINE) 1000 UNIT/ML IJ SOLN
INTRAMUSCULAR | Status: DC | PRN
Start: 2023-03-15 — End: 2023-03-16
  Administered 2023-03-15: 2000 [IU] via INTRAVENOUS
  Administered 2023-03-15: 5000 [IU] via INTRAVENOUS
  Administered 2023-03-15 (×2): 4000 [IU] via INTRAVENOUS
  Administered 2023-03-15: 3000 [IU] via INTRAVENOUS

## 2023-03-15 MED ORDER — ACETAMINOPHEN 325 MG PO TABS: 650.0000 mg | ORAL_TABLET | ORAL | Status: DC | PRN

## 2023-03-15 MED ORDER — OXYCODONE HCL 5 MG PO TABS: 5.0000 mg | ORAL_TABLET | ORAL | Status: DC | PRN

## 2023-03-15 MED ORDER — FAMOTIDINE IN NACL 20-0.9 MG/50ML-% IV SOLN
INTRAVENOUS | Status: DC | PRN
  Administered 2023-03-15: 20 mg via INTRAVENOUS

## 2023-03-15 MED ORDER — SODIUM CHLORIDE 0.9 % WEIGHT BASED INFUSION: 1.0000 mL/kg/h | INTRAVENOUS | Status: DC

## 2023-03-15 MED ORDER — IOHEXOL 350 MG/ML SOLN
INTRAVENOUS | Status: DC | PRN
  Administered 2023-03-15: 140 mL

## 2023-03-15 MED ORDER — NITROGLYCERIN 1 MG/10 ML FOR IR/CATH LAB
INTRA_ARTERIAL | Status: AC
  Filled 2023-03-15: qty 10

## 2023-03-15 MED ORDER — ATROPINE SULFATE 1 MG/10ML IJ SOSY
PREFILLED_SYRINGE | INTRAMUSCULAR | Status: DC | PRN
  Administered 2023-03-15: 1 mg via INTRAVENOUS

## 2023-03-15 MED ORDER — SODIUM CHLORIDE 0.9 % IV SOLN
INTRAVENOUS | Status: DC | PRN
  Administered 2023-03-15: 250 mL via INTRAVENOUS

## 2023-03-15 MED ORDER — FENTANYL CITRATE (PF) 100 MCG/2ML IJ SOLN
INTRAMUSCULAR | Status: AC
  Filled 2023-03-15: qty 2

## 2023-03-15 MED ORDER — CLOPIDOGREL BISULFATE 75 MG PO TABS: 75.0000 mg | ORAL_TABLET | ORAL | Status: DC

## 2023-03-15 SURGICAL SUPPLY — 25 items
BALLN EMERGE MR 3.0X12 (BALLOONS) ×1 IMPLANT
BALLN ~~LOC~~ EMERGE MR 2.5X20 (BALLOONS) ×1 IMPLANT
BALLN ~~LOC~~ EMERGE MR 3.25X15 (BALLOONS) ×1 IMPLANT
BALLN ~~LOC~~ EMERGE MR 4.0X12 (BALLOONS) ×1 IMPLANT
BALLOON EMERGE MR 3.0X12 (BALLOONS) IMPLANT
BALLOON ~~LOC~~ EMERGE MR 2.5X20 (BALLOONS) IMPLANT
BALLOON ~~LOC~~ EMERGE MR 3.25X15 (BALLOONS) IMPLANT
BALLOON ~~LOC~~ EMERGE MR 4.0X12 (BALLOONS) IMPLANT
CATH 5FR JL3.5 JR4 ANG PIG MP (CATHETERS) IMPLANT
CATH LAUNCHER 6FR JR4 (CATHETERS) IMPLANT
CATH OPTICROSS HD (CATHETERS) IMPLANT
CATH VISTA GUIDE 6FR XBLD 3.5 (CATHETERS) IMPLANT
DEVICE RAD COMP TR BAND LRG (VASCULAR PRODUCTS) IMPLANT
GLIDESHEATH SLEND SS 6F .021 (SHEATH) IMPLANT
GUIDEWIRE INQWIRE 1.5J.035X260 (WIRE) IMPLANT
INQWIRE 1.5J .035X260CM (WIRE) ×1 IMPLANT
KIT ENCORE 26 ADVANTAGE (KITS) IMPLANT
PACK CARDIAC CATHETERIZATION (CUSTOM PROCEDURE TRAY) ×1 IMPLANT
SET ATX-X65L (MISCELLANEOUS) IMPLANT
SLED PULL BACK IVUS (MISCELLANEOUS) IMPLANT
STENT SYNERGY XD 3.0X38 (Permanent Stent) IMPLANT
STENT SYNERGY XD 3.50X16 (Permanent Stent) IMPLANT
SYNERGY XD 3.0X38 (Permanent Stent) ×1 IMPLANT
SYNERGY XD 3.50X16 (Permanent Stent) ×1 IMPLANT
WIRE RUNTHROUGH .014X180CM (WIRE) IMPLANT

## 2023-03-15 NOTE — Discharge Instructions (Signed)

## 2023-03-15 NOTE — Discharge Summary (Signed)
Discharge Summary for Same Day PCI   Patient ID: Harry Nash MRN: [id number]; DOB: 1955/09/17  Admit date: 03/15/2023 Discharge date: 03/15/2023  Primary Care Provider: Etta Grandchild, MD  Primary Cardiologist: Jodelle Red, MD (Patient would like to transition to Dr. Excell Seltzer)  Primary Electrophysiologist:  None   Discharge Diagnoses    Active Problems:   Chest pain  Diagnostic Studies/Procedures    Cardiac Catheterization 03/15/2023:  Severe diffuse calcific proximal LAD stenosis treated successfully with a 3.0 x 38 mm Synergy DES postdilated to high-pressure and guided by intravascular ultrasound 2.  Severe focal mid RCA stenosis treated with a 3.5 x 16 mm Synergy DES guided by intravascular ultrasound 3.  Patent left main and left circumflex vessels with no significant stenoses   Recommendations: Patient has been on long-term clopidogrel.  Continue DAPT for at least 6 months then resume clopidogrel monotherapy.  Same day PCI protocol if criteria met.  Diagnostic Dominance: Right  Intervention   _____________   History of Present Illness     [name] is a 68 y.o. male with palpitations, transient ischemic attack, carotid artery stenosis, and hyperlipidemia who was seen in the office on 2/6 for follow up of abnormal CCTa.    He was seen by cardiology in November following a workup in September for palpitations, dizziness, and exertional dyspnea. At that time, he underwent a carotid duplex ultrasound and a 30-day monitor. The echocardiogram showed normal ejection fraction with mild aortic insufficiency. The carotid artery duplex revealed mild right ICA stenosis and moderate left ICA stenosis. The 30-day monitor showed sinus rhythm with no significant arrhythmias.   Also in November, patient's PCP ordered a cardiac calcium score CT but he did not receive results until 02/23/23. This CT showed calcium score of 658, aortic atherosclerosis. Given elevated  score, cardiac CTA with FFR reflex ordered. This found severe stenosis (70-99%) due to mixed plaque at the proximal LAD, moderate stenosis (50-69%) due to mixed plaque at ostial LCX, RCA with mild stenosis (25-49%) due to soft plaque at mid RCA. FFR of LAD: Proximal FFR = 0.99, mid FFR = 0.64, distal FFR = 0.58.    At his follow up visit on 2/6 he reported that he had continued to experience some dyspnea, particularly during activities such as biking or walking up stairs, and sometimes felt out of breath during minimal exertion. No chest discomfort was noted.   Currently on pitavastatin, which he has been tolerating well, despite a history of myalgias with other statins, including rosuvastatin. He is also on Repatha and Plavix, the latter primarily for TIA-related reasons. He was not currently take aspirin at this visit.   He was concerned about the delay in receiving results from a CT scan ordered in November, which he only received three days ago.   He has a significant family history of cardiovascular disease, which he acknowledges as a genetic predisposition. Given his CCTa findings he was set up for outpatient cardiac cath.   Hospital Course     The patient underwent cardiac cath as noted above with PCI/DES x1 to RCA, and DESx1 to LAD. Plan for DAPT with ASA/plavix for at least 6 months, then plavix monotherapy. The patient was seen by cardiac rehab while in short stay. There were no observed complications post cath. Radial cath site was re-evaluated prior to discharge and found to be stable without any complications. Instructions/precautions regarding cath site care were given prior to discharge.  [name] was seen  by Dr. Excell Seltzer and determined stable for discharge home. Follow up with our office has been arranged. Medications are listed below. Pertinent changes include n/a. Patient requested to transition to Dr. Excell Seltzer who was agreeable.  _____________  Cath/PCI Registry Performance &  Quality Measures: Aspirin prescribed? - Yes ADP Receptor Inhibitor (Plavix/Clopidogrel, Brilinta/Ticagrelor or Effient/Prasugrel) prescribed (includes medically managed patients)? - Yes High Intensity Statin (Lipitor 40-80mg  or Crestor 20-40mg ) prescribed? - No - intolerant, on repatha For EF <40%, was ACEI/ARB prescribed? - Not Applicable (EF >/= 40%) For EF <40%, Aldosterone Antagonist (Spironolactone or Eplerenone) prescribed? - Not Applicable (EF >/= 40%) Cardiac Rehab Phase II ordered (Included Medically managed Patients)? - Yes  _____________   Discharge Vitals Blood pressure 121/79, pulse 95, temperature 98 F (36.7 C), temperature source Oral, resp. rate 17, height 5\' 11"  (1.803 m), weight 86.6 kg, SpO2 94%.  Filed Weights   03/15/23 0933  Weight: 86.6 kg    Last Labs & Radiologic Studies    CBC No results for input(s): "WBC", "NEUTROABS", "HGB", "HCT", "MCV", "PLT" in the last 72 hours. Basic Metabolic Panel No results for input(s): "NA", "K", "CL", "CO2", "GLUCOSE", "BUN", "CREATININE", "CALCIUM", "MG", "PHOS" in the last 72 hours. Liver Function Tests No results for input(s): "AST", "ALT", "ALKPHOS", "BILITOT", "PROT", "ALBUMIN" in the last 72 hours. No results for input(s): "LIPASE", "AMYLASE" in the last 72 hours. High Sensitivity Troponin:   No results for input(s): "TROPONINIHS" in the last 720 hours.  BNP Invalid input(s): "POCBNP" D-Dimer No results for input(s): "DDIMER" in the last 72 hours. Hemoglobin A1C No results for input(s): "HGBA1C" in the last 72 hours. Fasting Lipid Panel No results for input(s): "CHOL", "HDL", "LDLCALC", "TRIG", "CHOLHDL", "LDLDIRECT" in the last 72 hours. Thyroid Function Tests No results for input(s): "TSH", "T4TOTAL", "T3FREE", "THYROIDAB" in the last 72 hours.  Invalid input(s): "FREET3" _____________  CARDIAC CATHETERIZATION Result Date: 03/15/2023 1.  Severe diffuse calcific proximal LAD stenosis treated successfully with  a 3.0 x 38 mm Synergy DES postdilated to high-pressure and guided by intravascular ultrasound 2.  Severe focal mid RCA stenosis treated with a 3.5 x 16 mm Synergy DES guided by intravascular ultrasound 3.  Patent left main and left circumflex vessels with no significant stenoses Recommendations: Patient has been on long-term clopidogrel.  Continue DAPT for at least 6 months then resume clopidogrel monotherapy.  Same day PCI protocol if criteria met.   CT CORONARY FFR DATA PREP & FLUID ANALYSIS Result Date: 03/08/2023 EXAM: CT FFR ANALYSIS CLINICAL DATA:  Chest pain/anginal equiv, high CAD risk, not treadmill candidate FINDINGS: FFRct analysis was performed on the original cardiac CT angiogram dataset. Diagrammatic representation of the FFRct analysis is provided in a separate PDF document in PACS. This dictation was created using the PDF document and an interactive 3D model of the results. 3D model is not available in the EMR/PACS. Normal FFR range is >0.80. Indeterminate (grey) zone is 0.76-0.80. 1. Left Main: FFR = 0.99 2. LAD: Proximal FFR = 0.99, mid FFR = 0.64, distal FFR = 0.58 3. D1: Proximal FFR = 0.95, distal FFR = not modeled due to small vessel size 4. LCX: Proximal FFR = 0.98, distal FFR = 0.92 5. RCA: Proximal FFR = 0.99, mid FFR =0.90, Distal FFR = 0.87 IMPRESSION: 1. CT FFR analysis shows hemodynamically significant stenosis at the proximal LAD. RECOMMENDATIONS: Goal directed medical therapy and aggressive risk factor modification for secondary prevention of coronary artery disease. Up titration of anti-anginal treatment. Invasive angiography recommended. Findings  of both coronary CTA and CT-FFR findings conveyed to Dr. Yetta Barre (ordering physician) over the phone. If patient is symptomatic recommend going to the ER for an expedited evaluation otherwise will arrange outpatient follow up. Electronically Signed   By: Tessa Lerner D.O.   On: 03/08/2023 16:42   CT CORONARY MORPH W/CTA COR W/SCORE W/CA  W/CM &/OR WO/CM Result Date: 03/08/2023 HISTORY: Chest pain/anginal equiv, high CAD risk, not treadmill candidate EXAM: Cardiac/Coronary  CT TECHNIQUE: The patient was scanned on a [employer]. PROTOCOL: A non-contrast, gated CT scan was obtained with axial slices of 3 mm through the heart for calcium scoring. Calcium scoring was performed using the Agatston method. A 120 kV prospective, gated, contrast cardiac scan was obtained. Gantry rotation speed was 250 msecs and collimation was 0.6 mm. Two sublingual nitroglycerin tablets (0.8 mg) were given. The 3 D data set was reconstructed in 5% intervals of the 35-75% of the R-R cycle. Diastolic phases were analyzed on a dedicated workstation using MPR, MIP, and VRT modes. The patient received 95 cc of contrast. FINDINGS: Image quality: Average. Artifact: Limited. Coronary artery calcification score: Left main: 0 Left anterior descending artery: 458 Left circumflex artery: 200 Right coronary artery: 0.916 Total coronary calcium score of 659, places the patient at the 83rd percentile for age and sex matched control. Coronary arteries: Normal coronary origins.  Right dominance. Left Main Coronary Artery: Normal caliber vessel, originates from the left coronary cusp, bifurcates into a left anterior descending artery (LAD) and left circumflex artery (LCX). Left main is patent. Left Anterior Descending Artery: Normal caliber vessel, reaches the apex, gives off 4 diagonal branches. Severe stenosis (70-99%) due to mixed plaque at the proximal LAD. Minimal calcified plaque burden within the mid LAD. Distal to apical segments are patent. First Diagonal branch: Normal size and caliber vessel, minimal calcified plaque (0-24%) at the ostium otherwise vessel is patent. Second Diagonal branch: Small, patent Third Diagonal branch: Small, patent Fourth Diagonal branch: Small, patent Left Circumflex Artery: Normal caliber vessel, non-dominant, travels within the  atrioventricular groove, gives off 2 obtuse marginal branches. Moderate stenosis (50-69%) due to mixed plaque at the ostial LCX. Mild stenosis (25-49%) due to calcified plaque at the mid LCX. First Obtuse Marginal branch: Patent. Second Obtuse Marginal branch: Normal caliber, large in size, patent. Right Coronary Artery: Dominant vessel, originates from the right coronary cusp, terminates as a PDA and right posterolateral branch. Mild stenosis (25-49%) due to soft plaque at the mid RCA. Plaque Analysis: Calcified plaque 134 mm3 Non-calcified plaque 459 mm3 (low attenuation plaque 17 mm3). Total plaque volume is 593 mm3, categorized as severe plaque burden, which is 51st percentile for age- and sex-matched controls. Left Atrium: Grossly normal in size with no left atrial appendage filling defect. Patent foramen ovale. Left Ventricle: Grossly normal in size. There is no abnormal filling defect. Pulmonary arteries: Normal in size without proximal filling defect. Pulmonary veins: Normal pulmonary venous drainage. Aorta: Normal size, 35 mm at the mid ascending aorta (level of the PA bifurcation) measured double oblique. Aortic atherosclerosis. No dissection. Pericardium: Normal thickness with no significant effusion or calcium present. Aortic Valve:Trileaflet without significant calcification. Mitral Valve: Normal structure without significant calcification. Extra-cardiac findings: See attached radiology report for non-cardiac structures. IMPRESSION: 1. Total coronary calcium score of 659, places the patient at the 83rd percentile for age and sex matched control. 2. Total plaque volume is 593 mm3, categorized as severe plaque burden, which is 51st percentile for age- and sex-matched controls.  3.  Normal coronary origin with right dominance. 4. CAD-RADS = 4 Severe coronary artery disease, summarized findings noted below. Left Main: Patent. LAD: Severe stenosis (70-99%) due to mixed plaque at the proximal LAD. LCX:  Moderate stenosis (50-69%) due to mixed plaque at the ostial LCX. RCA: Mild stenosis (25-49%) due to soft plaque at the mid RCA. 5.  Aortic atherosclerosis. 6.  Patent foramen ovale. 7. Study is sent for CT-FFR to further evaluate LAD and LCx . Findings will be performed and reported separately. RECOMMENDATIONS: Will send the study to CT-FFR for further evaluation. Consider symptom-guided anti-ischemic pharmacotherapy as well as risk factor modification per guideline directed care. Invasive coronary angiography recommended with revascularization per published guideline statements if either CT-FFR notes hemodynamically significant stenosis or continues to have symptoms despite up titration of medical therapy. Electronically Signed   By: Tessa Lerner D.O.   On: 03/08/2023 16:31    Disposition   Pt is being discharged home today in good condition.  Follow-up Plans & Appointments     Discharge Instructions     Amb Referral to Cardiac Rehabilitation   Complete by: As directed    Diagnosis: Coronary Stents   After initial evaluation and assessments completed: Virtual Based Care may be provided alone or in conjunction with Phase 2 Cardiac Rehab based on patient barriers.: Yes   Intensive Cardiac Rehabilitation (ICR) [facility] location only OR Traditional Cardiac Rehabilitation (TCR) *If criteria for ICR are not met will enroll in TCR [facility] only): Yes        Discharge Medications   Allergies as of 03/15/2023       Reactions   Gabapentin Other (See Comments)   Clouded mentation   Rosuvastatin Other (See Comments)   myalgias        Medication List     TAKE these medications    aspirin EC 81 MG tablet Take 1 tablet (81 mg total) by mouth daily. Swallow whole.   buPROPion 150 MG 24 hr tablet Commonly known as: Wellbutrin XL Take 1 tablet (150 mg total) by mouth daily.   clopidogrel 75 MG tablet Commonly known as: PLAVIX TAKE 1 TABLET (75 MG) BY MOUTH DAILY, need to schedule appointment  with PCP   CO Q 10 PO Take 300 mg by mouth daily.   cyanocobalamin 1000 MCG tablet Commonly known as: VITAMIN B12 Take 1,000 mcg by mouth daily.   LORazepam 0.5 MG tablet Commonly known as: ATIVAN TAKE ONE TABLET BY MOUTH TWICE DAILY AS NEEDED FOR ANXIETY   metoprolol tartrate 25 MG tablet Commonly known as: LOPRESSOR Take 0.5 tablets (12.5 mg total) by mouth 2 (two) times daily as needed. For heart rate >100   nitroGLYCERIN 0.4 MG SL tablet Commonly known as: NITROSTAT Place 1 tablet (0.4 mg total) under the tongue every 5 (five) minutes as needed for chest pain. If no relief, contact 911   Pitavastatin Calcium 1 MG Tabs Take 1 tablet (1 mg total) by mouth daily.   Repatha SureClick 140 MG/ML Soaj Generic drug: Evolocumab Inject 140 mg into the skin every 14 (fourteen) days.   tadalafil 5 MG tablet Commonly known as: CIALIS Take 5 mg by mouth daily.   vortioxetine HBr 5 MG Tabs tablet Commonly known as: TRINTELLIX Take 1 tablet (5 mg total) by mouth daily.        Allergies Allergies  Allergen Reactions   Gabapentin Other (See Comments)    Clouded mentation   Rosuvastatin Other (See Comments)    myalgias  Outstanding Labs/Studies   N/a   Duration of Discharge Encounter   Greater than 30 minutes including physician time.  Signed, Laverda Page, NP 03/15/2023, 4:05 PM

## 2023-03-15 NOTE — Interval H&P Note (Signed)
History and Physical Interval Note:  03/15/2023 11:52 AM  Harry Nash  has presented today for surgery, with the diagnosis of Coronary artery diease.  The various methods of treatment have been discussed with the patient and family. After consideration of risks, benefits and other options for treatment, the patient has consented to  Procedure(s): LEFT HEART CATH AND CORONARY ANGIOGRAPHY (N/A) as a surgical intervention.  The patient's history has been reviewed, patient examined, no change in status, stable for surgery.  I have reviewed the patient's chart and labs.  Questions were answered to the patient's satisfaction.     Tonny Bollman

## 2023-03-15 NOTE — Progress Notes (Signed)
TR band removed at 1830, gauze dressing applied. Right radial level 0, clean, dry, and intact. Patient walked to the bathroom without difficulties.

## 2023-03-15 NOTE — Progress Notes (Signed)
CARDIAC REHAB PHASE I     Post stent education including site care, restrictions, risk factors, exercise guidelines, NTG use, antiplatelet therapy importance, heart healthy diet and CRP2 reviewed. All questions and concerns addressed. Will refer to [facility] for CRP2. Plan for home later today.    [id number] Woodroe Chen, RN BSN 03/15/2023 3:05 PM

## 2023-03-16 ENCOUNTER — Encounter (HOSPITAL_COMMUNITY): Payer: Self-pay | Admitting: Cardiovascular Disease

## 2023-03-18 ENCOUNTER — Emergency Department (HOSPITAL_COMMUNITY): Payer: 59

## 2023-03-18 ENCOUNTER — Emergency Department (HOSPITAL_COMMUNITY)
Admission: EM | Admit: 2023-03-18 | Discharge: 2023-03-18 | Disposition: A | Discharge status: Home / Self Care | Payer: 59 | Attending: Emergency Medicine | Admitting: Emergency Medicine

## 2023-03-18 ENCOUNTER — Other Ambulatory Visit: Payer: Self-pay

## 2023-03-18 ENCOUNTER — Encounter (HOSPITAL_COMMUNITY): Payer: Self-pay

## 2023-03-18 DIAGNOSIS — I2510 Atherosclerotic heart disease of native coronary artery without angina pectoris: Secondary | ICD-10-CM | POA: Insufficient documentation

## 2023-03-18 DIAGNOSIS — R079 Chest pain, unspecified: Secondary | ICD-10-CM

## 2023-03-18 DIAGNOSIS — Z7982 Long term (current) use of aspirin: Secondary | ICD-10-CM | POA: Insufficient documentation

## 2023-03-18 DIAGNOSIS — R0789 Other chest pain: Secondary | ICD-10-CM | POA: Diagnosis present

## 2023-03-18 LAB — COMPREHENSIVE METABOLIC PANEL
ALT: 27 U/L (ref 0–44)
AST: 29 U/L (ref 15–41)
Albumin: 3.6 g/dL (ref 3.5–5.0)
Alkaline Phosphatase: 57 U/L (ref 38–126)
Anion gap: 12 (ref 5–15)
BUN: 20 mg/dL (ref 8–23)
CO2: 21 mmol/L — ABNORMAL LOW (ref 22–32)
Calcium: 9.3 mg/dL (ref 8.9–10.3)
Chloride: 105 mmol/L (ref 98–111)
Creatinine, Ser: 1.01 mg/dL (ref 0.61–1.24)
GFR, Estimated: >60 mL/min (ref >60)
Glucose, Bld: 114 mg/dL — ABNORMAL HIGH (ref 70–99)
Potassium: 3.8 mmol/L (ref 3.5–5.1)
Sodium: 138 mmol/L (ref 135–145)
Total Bilirubin: 0.7 mg/dL (ref 0.0–1.2)
Total Protein: 6.4 g/dL — ABNORMAL LOW (ref 6.5–8.1)

## 2023-03-18 LAB — TROPONIN I (HIGH SENSITIVITY)
Troponin I (High Sensitivity): 25 ng/L — ABNORMAL HIGH (ref <18)
Troponin I (High Sensitivity): 27 ng/L — ABNORMAL HIGH (ref <18)

## 2023-03-18 LAB — CBC WITH DIFFERENTIAL/PLATELET
Abs Immature Granulocytes: 0.04 10*3/uL (ref 0.00–0.07)
Basophils Absolute: 0 10*3/uL (ref 0.0–0.1)
Basophils Relative: 0 %
Eosinophils Absolute: 0.2 10*3/uL (ref 0.0–0.5)
Eosinophils Relative: 3 %
HCT: 42.8 % (ref 39.0–52.0)
Hemoglobin: 14.4 g/dL (ref 13.0–17.0)
Immature Granulocytes: 1 %
Lymphocytes Relative: 19 %
Lymphs Abs: 1.5 10*3/uL (ref 0.7–4.0)
MCH: 29.3 pg (ref 26.0–34.0)
MCHC: 33.6 g/dL (ref 30.0–36.0)
MCV: 87 fL (ref 80.0–100.0)
Monocytes Absolute: 0.9 10*3/uL (ref 0.1–1.0)
Monocytes Relative: 12 %
Neutro Abs: 4.9 10*3/uL (ref 1.7–7.7)
Neutrophils Relative %: 65 %
Platelets: 230 10*3/uL (ref 150–400)
RBC: 4.92 MIL/uL (ref 4.22–5.81)
RDW: 12.5 % (ref 11.5–15.5)
WBC: 7.6 10*3/uL (ref 4.0–10.5)
nRBC: 0 % (ref 0.0–0.2)

## 2023-03-18 LAB — D-DIMER, QUANTITATIVE: D-Dimer, Quant: 0.91 ug{FEU}/mL — ABNORMAL HIGH (ref 0.00–0.50)

## 2023-03-18 MED ORDER — LIDOCAINE VISCOUS HCL 2 % MT SOLN
15.0000 mL | Freq: Once | OROMUCOSAL | Status: AC
  Administered 2023-03-18: 15 mL via OROMUCOSAL
  Filled 2023-03-18: qty 15

## 2023-03-18 MED ORDER — ALUM & MAG HYDROXIDE-SIMETH 200-200-20 MG/5ML PO SUSP
30.0000 mL | Freq: Once | ORAL | Status: AC
  Administered 2023-03-18: 30 mL via ORAL
  Filled 2023-03-18: qty 30

## 2023-03-18 MED ORDER — NITROGLYCERIN IN D5W 200-5 MCG/ML-% IV SOLN
0.0000 ug/min | INTRAVENOUS | Status: DC
  Administered 2023-03-18: 5 ug/min via INTRAVENOUS
  Filled 2023-03-18: qty 250

## 2023-03-18 MED ORDER — IOHEXOL 350 MG/ML SOLN
75.0000 mL | Freq: Once | INTRAVENOUS | Status: AC | PRN
  Administered 2023-03-18: 75 mL via INTRAVENOUS

## 2023-03-18 MED ORDER — ESOMEPRAZOLE MAGNESIUM 40 MG PO CPDR: 40.0000 mg | DELAYED_RELEASE_CAPSULE | Freq: Every day | ORAL | 0 refills | Status: DC

## 2023-03-18 NOTE — ED Provider Notes (Signed)
 [place] [facility] DEPARTMENT AT [facility] Provider Note   CSN: [id number] Arrival date & time: 03/18/23  1815     History  Chief Complaint  Patient presents with   Chest Pain    Harry Nash is a 68 y.o. male history of CAD status post 2 stents to LAD 2 days ago here presenting with chest pressure.  Patient states that he took his medications this morning on empty stomach.  Since then he has some chest pressure.  He initially thought it was indigestion and ate some food but still has some pressure sensation.  She states that it is substernal.  Denies any pleuritic chest pain.  Patient states that he did not miss any doses of his Plavix.  He then took 2 nitro but still had symptoms so was on the way to the ER and decided to call ambulance.  EMS gave him another 2 nitros and aspirin.  He states that he still having some chest pressure.  The history is provided by the patient.       Home Medications Prior to Admission medications   Medication Sig Start Date End Date Taking? Authorizing Provider  aspirin EC 81 MG tablet Take 1 tablet (81 mg total) by mouth daily. Swallow whole. 03/09/23   Perlie Gold, PA-C  buPROPion (WELLBUTRIN XL) 150 MG 24 hr tablet Take 1 tablet (150 mg total) by mouth daily. 02/16/23   Etta Grandchild, MD  clopidogrel (PLAVIX) 75 MG tablet TAKE 1 TABLET (75 MG) BY MOUTH DAILY, need to schedule appointment with PCP 01/05/23   Etta Grandchild, MD  Coenzyme Q10 (CO Q 10 PO) Take 300 mg by mouth daily.    [provider]  cyanocobalamin (VITAMIN B12) 1000 MCG tablet Take 1,000 mcg by mouth daily.    [provider]  LORazepam (ATIVAN) 0.5 MG tablet TAKE ONE TABLET BY MOUTH TWICE DAILY AS NEEDED FOR ANXIETY 09/16/22   Caro Laroche, DO  metoprolol tartrate (LOPRESSOR) 25 MG tablet Take 0.5 tablets (12.5 mg total) by mouth 2 (two) times daily as needed. For heart rate >100 10/17/22 03/15/23  Carlos Levering, NP  nitroGLYCERIN  (NITROSTAT) 0.4 MG SL tablet Place 1 tablet (0.4 mg total) under the tongue every 5 (five) minutes as needed for chest pain. If no relief, contact 911 03/09/23 06/07/23  Perlie Gold, PA-C  Pitavastatin Calcium 1 MG TABS Take 1 tablet (1 mg total) by mouth daily. 02/23/23   Etta Grandchild, MD  REPATHA SURECLICK 140 MG/ML SOAJ Inject 140 mg into the skin every 14 (fourteen) days. 12/21/22   Jodelle Red, MD  tadalafil (CIALIS) 5 MG tablet Take 5 mg by mouth daily.    [provider]  vortioxetine HBr (TRINTELLIX) 5 MG TABS tablet Take 1 tablet (5 mg total) by mouth daily. 02/16/23   Etta Grandchild, MD      Allergies    Gabapentin and Rosuvastatin    Review of Systems   Review of Systems  Cardiovascular:  Positive for chest pain.  All other systems reviewed and are negative.   Physical Exam Updated Vital Signs BP 129/85   Pulse (!) 102   Temp 98.4 F (36.9 C) (Oral)   Resp 12   SpO2 90%  Physical Exam Vitals and nursing note reviewed.  Constitutional:      Appearance: He is well-developed.  HENT:     Head: Normocephalic.  Eyes:     Extraocular Movements: Extraocular movements intact.  Pupils: Pupils are equal, round, and reactive to light.  Cardiovascular:     Rate and Rhythm: Normal rate and regular rhythm.     Heart sounds: Normal heart sounds.  Pulmonary:     Effort: Pulmonary effort is normal.     Breath sounds: Normal breath sounds.  Abdominal:     General: Bowel sounds are normal.     Palpations: Abdomen is soft.  Musculoskeletal:        General: Normal range of motion.     Cervical back: Normal range of motion and neck supple.  Skin:    General: Skin is warm.     Capillary Refill: Capillary refill takes less than 2 seconds.  Neurological:     General: No focal deficit present.     Mental Status: He is alert.  Psychiatric:        Mood and Affect: Mood normal.        Behavior: Behavior normal.     ED Results / Procedures / Treatments    Labs (all labs ordered are listed, but only abnormal results are displayed) Labs Reviewed  COMPREHENSIVE METABOLIC PANEL - Abnormal; Notable for the following components:      Result Value   CO2 21 (*)    Glucose, Bld 114 (*)    Total Protein 6.4 (*)    All other components within normal limits  TROPONIN I (HIGH SENSITIVITY) - Abnormal; Notable for the following components:   Troponin I (High Sensitivity) 25 (*)    All other components within normal limits  CBC WITH DIFFERENTIAL/PLATELET  D-DIMER, QUANTITATIVE    EKG None  Radiology DG Chest Port 1 View Result Date: 03/18/2023 CLINICAL DATA:  Chest pain.  Stent placement 03/15/2023 EXAM: PORTABLE CHEST 1 VIEW COMPARISON:  Chest x-ray 10/02/2020 FINDINGS: The heart and mediastinal contours are within normal limits. No focal consolidation. No pulmonary edema. No pleural effusion. No pneumothorax. No acute osseous abnormality. IMPRESSION: No active disease. Electronically Signed   By: Tish Frederickson M.D.   On: 03/18/2023 19:12    Procedures Procedures    CRITICAL CARE Performed by: Richardean Canal   Total critical care time: 38 minutes  Critical care time was exclusive of separately billable procedures and treating other patients.  Critical care was necessary to treat or prevent imminent or life-threatening deterioration.  Critical care was time spent personally by me on the following activities: development of treatment plan with patient and/or surrogate as well as nursing, discussions with consultants, evaluation of patient's response to treatment, examination of patient, obtaining history from patient or surrogate, ordering and performing treatments and interventions, ordering and review of laboratory studies, ordering and review of radiographic studies, pulse oximetry and re-evaluation of patient's condition.   Medications Ordered in ED Medications  nitroGLYCERIN 50 mg in dextrose 5 % 250 mL (0.2 mg/mL) infusion (has no  administration in time range)    ED Course/ Medical Decision Making/ A&P                                 Medical Decision Making Harry Nash is a 68 y.o. male here presenting with chest pressure.  Patient had LAD stents placed 2 days ago.  EKG did not show any STEMI and I have low suspicion for in-stent stenosis.  However concern for possible ACS.  Plan to get CBC and CMP and troponin x 2 and consult cardiology  7:32 PM Initial  troponin is 25. Discussed with Dr. Virgina Jock, cardiology fellow overnight.  She recommend D-dimer and she will see the patient.  Given persistent symptoms, I started patient on nitro drip  8:15 PM D-dimer elevated. Will get CTA chest.   9:54 PM CTA showed no PE. Dr. Virgina Jock saw patient and since second trop is flat at 42, she felt that he is stable for discharge. Thinks likely GI in origin and recommend GI cocktail. I updated patient about results.   10:13 PM Patient felt better after GI cocktail. Stable for discharge   Problems Addressed: Chest pain, unspecified type: acute illness or injury  Amount and/or Complexity of Data Reviewed Labs: ordered. Decision-making details documented in ED Course. Radiology: ordered and independent interpretation performed. Decision-making details documented in ED Course. ECG/medicine tests: ordered and independent interpretation performed. Decision-making details documented in ED Course.  Risk OTC drugs. Prescription drug management.    Final Clinical Impression(s) / ED Diagnoses Final diagnoses:  None    Rx / DC Orders ED Discharge Orders     None         Charlynne Pander, MD 03/18/23 2214

## 2023-03-18 NOTE — Discharge Instructions (Addendum)
 You likely have gastritis  Take nexium 40 mg daily before breakfast  Please take your Plavix with food  See your cardiologist for follow-up  Return to ER if you have worse chest pain or shortness of breath

## 2023-03-18 NOTE — Consult Note (Signed)
 Cardiology Admission History and Physical:   Patient ID: Harry Nash MRN: [id number]; DOB: [birth year]   Admission date: 03/18/2023  Primary Care Provider: Etta Grandchild, MD Primary Cardiologist: Jodelle Red, MD  Primary Electrophysiologist:  None   Chief Complaint: Chest discomfort  Patient Profile:   Harry Nash is a 68 y.o. male with coronary artery disease status post PCI to the LAD and RCA in February 2025, prior TIA, carotid artery stenosis, and hyperlipidemia.  History of Present Illness:   Mr. Ure states that he began having indigestion type pain earlier this morning. The discomfort is present in the epigastric region and he can also feel it in his chest and neck. This discomfort has continued to persist throughout the day.  He told his wife about his symptoms which prompted them to come to the emergency department. He took 2 nitroglycerin with EMS without any relief upon arrival to the emergency room he was given 2 nitroglycerin with minimal relief.   In the emergency room, his blood pressure was 129/85, heart rate 102, respiratory rate 15.  Checks x-ray without any acute cardiopulmonary process. EKG shows sinus tachycardia with no ST segment changes or T wave inversions. Initial troponin 25  Of note Mr. Fairbank had a coronary calcium score in November with a calcium score 658. An FFR demonstrated severe stenosis in the proximal LAD moderate stenosis at the ostial circumflex, and mild stenosis in the RCA.  He received a coronary angiogram on March 15, 2023. He had a PCI to his proximal LAD and PCI to mid RCA.  No significant residual stenosis.   Past Medical History:  Diagnosis Date   Depression    Headache(784.0)    Kidney stones    TIA (transient ischemic attack)     Past Surgical History:  Procedure Laterality Date   CORONARY STENT INTERVENTION N/A 03/15/2023   Procedure: CORONARY STENT INTERVENTION;  Surgeon: Tonny Bollman, MD;  Location: [facility]  INVASIVE CV LAB;  Service: Cardiovascular;  Laterality: N/A;   CORONARY ULTRASOUND/IVUS N/A 03/15/2023   Procedure: Coronary Ultrasound/IVUS;  Surgeon: Tonny Bollman, MD;  Location: [facility] INVASIVE CV LAB;  Service: Cardiovascular;  Laterality: N/A;   HERNIA REPAIR     LEFT HEART CATH AND CORONARY ANGIOGRAPHY N/A 03/15/2023   Procedure: LEFT HEART CATH AND CORONARY ANGIOGRAPHY;  Surgeon: Tonny Bollman, MD;  Location: [facility] INVASIVE CV LAB;  Service: Cardiovascular;  Laterality: N/A;   ROTATOR CUFF REPAIR       Medications Prior to Admission: Prior to Admission medications   Medication Sig Start Date End Date Taking? Authorizing Provider  aspirin EC 81 MG tablet Take 1 tablet (81 mg total) by mouth daily. Swallow whole. 03/09/23   Perlie Gold, PA-C  buPROPion (WELLBUTRIN XL) 150 MG 24 hr tablet Take 1 tablet (150 mg total) by mouth daily. 02/16/23   Etta Grandchild, MD  clopidogrel (PLAVIX) 75 MG tablet TAKE 1 TABLET (75 MG) BY MOUTH DAILY, need to schedule appointment with PCP 01/05/23   Etta Grandchild, MD  Coenzyme Q10 (CO Q 10 PO) Take 300 mg by mouth daily.    [provider]  cyanocobalamin (VITAMIN B12) 1000 MCG tablet Take 1,000 mcg by mouth daily.    [provider]  LORazepam (ATIVAN) 0.5 MG tablet TAKE ONE TABLET BY MOUTH TWICE DAILY AS NEEDED FOR ANXIETY 09/16/22   Caro Laroche, DO  metoprolol tartrate (LOPRESSOR) 25 MG tablet Take 0.5 tablets (12.5 mg total) by mouth 2 (two) times daily  as needed. For heart rate >100 10/17/22 03/15/23  Carlos Levering, NP  nitroGLYCERIN (NITROSTAT) 0.4 MG SL tablet Place 1 tablet (0.4 mg total) under the tongue every 5 (five) minutes as needed for chest pain. If no relief, contact 911 03/09/23 06/07/23  Perlie Gold, PA-C  Pitavastatin Calcium 1 MG TABS Take 1 tablet (1 mg total) by mouth daily. 02/23/23   Etta Grandchild, MD  REPATHA SURECLICK 140 MG/ML SOAJ Inject 140 mg into the skin every 14 (fourteen) days. 12/21/22   Jodelle Red, MD  tadalafil (CIALIS) 5 MG tablet Take 5 mg by mouth daily.    [provider]  vortioxetine HBr (TRINTELLIX) 5 MG TABS tablet Take 1 tablet (5 mg total) by mouth daily. 02/16/23   Etta Grandchild, MD     Allergies:    Allergies  Allergen Reactions   Gabapentin Other (See Comments)    Clouded mentation   Rosuvastatin Other (See Comments)    myalgias    Social History:   Social History   Socioeconomic History   Marital status: Married    Spouse name: Not on file   Number of children: 3   Years of education: Not on file   Highest education level: Master's degree (e.g., MA, MS, MEng, MEd, MSW, MBA)  Occupational History   Not on file  Tobacco Use   Smoking status: Former    Current packs/day: 0.00    Types: Cigarettes    Quit date: 02/01/2003    Years since quitting: 20.1   Smokeless tobacco: Never  Vaping Use   Vaping status: Never Used  Substance and Sexual Activity   Alcohol use: Yes    Alcohol/week: 1.0 standard drink of alcohol    Types: 1 Cans of beer per week    Comment: moderate   Drug use: No   Sexual activity: Yes  Other Topics Concern   Not on file  Social History Narrative   Not on file   Social Drivers of Health   Financial Resource Strain: Low Risk  (11/15/2022)   Overall Financial Resource Strain (CARDIA)    Difficulty of Paying Living Expenses: Not hard at all  Food Insecurity: No Food Insecurity (11/15/2022)   Hunger Vital Sign    Worried About Running Out of Food in the Last Year: Never true    Ran Out of Food in the Last Year: Never true  Transportation Needs: No Transportation Needs (11/15/2022)   PRAPARE - Administrator, Civil Service (Medical): No    Lack of Transportation (Non-Medical): No  Physical Activity: Insufficiently Active (11/15/2022)   Exercise Vital Sign    Days of Exercise per Week: 4 days    Minutes of Exercise per Session: 30 min  Stress: No Stress Concern Present (11/15/2022)   [employer] of Occupational Health - Occupational Stress Questionnaire    Feeling of Stress : Not at all  Social Connections: Unknown (11/15/2022)   Social Connection and Isolation Panel [NHANES]    Frequency of Communication with Friends and Family: Three times a week    Frequency of Social Gatherings with Friends and Family: Once a week    Attends Religious Services: Patient declined    Database administrator or Organizations: No    Attends Engineer, structural: Not on file    Marital Status: Married  Recent Concern: Social Connections - Moderately Isolated (10/09/2022)   Social Connection and Isolation Panel [NHANES]    Frequency of Communication with  Friends and Family: Three times a week    Frequency of Social Gatherings with Friends and Family: Once a week    Attends Religious Services: Never    Database administrator or Organizations: No    Attends Engineer, structural: Not on file    Marital Status: Married  Catering manager Violence: Not on file    Family History:   The patient's family history includes Heart disease in his brother, brother, father, mother, sister, and sister; Hypertension in his father.    Review of Systems: [y] = yes, [ ]  = no    General: Weight gain [ ] ; Weight loss [ ] ; Anorexia [ ] ; Fatigue [ ] ; Fever [ ] ; Chills [ ] ; Weakness [ ]   Cardiac: Chest pain/pressure [ y]; Resting SOB [ ] ; Exertional SOB [ ] ; Orthopnea [ ] ; Pedal Edema [ ] ; Palpitations [ ] ; Syncope [ ] ; Presyncope [ ] ; Paroxysmal nocturnal dyspnea[ ]   Pulmonary: Cough [ ] ; Wheezing[ ] ; Hemoptysis[ ] ; Sputum [ ] ; Snoring [ ]   GI: Vomiting[ ] ; Dysphagia[ ] ; Melena[ ] ; Hematochezia [ ] ; Heartburn[ ] ; Abdominal pain [ y]; Constipation [ ] ; Diarrhea [ ] ; BRBPR [ ]   GU: Hematuria[ ] ; Dysuria [ ] ; Nocturia[ ]   Vascular: Pain in legs with walking [ ] ; Pain in feet with lying flat [ ] ; Non-healing sores [ ] ; Stroke [ ] ; TIA [ ] ; Slurred speech [ ] ;  Neuro: Headaches[ ] ; Vertigo[ ] ;  Seizures[ ] ; Paresthesias[ ] ;Blurred vision [ ] ; Diplopia [ ] ; Vision changes [ ]   Ortho/Skin: Arthritis [ ] ; Joint pain [ ] ; Muscle pain [ ] ; Joint swelling [ ] ; Back Pain [ ] ; Rash [ ]   Psych: Depression[ ] ; Anxiety[ ]   Heme: Bleeding problems [ ] ; Clotting disorders [ ] ; Anemia [ ]   Endocrine: Diabetes [ ] ; Thyroid dysfunction[ ]   Physical Exam/Data:   Vitals:   03/18/23 1915 03/18/23 1945 03/18/23 2000 03/18/23 2015  BP: 123/81 129/83 129/86 128/85  Pulse: 88 74 77 80  Resp: 18 15 20 19   Temp:      TempSrc:      SpO2: 95% 94% 94% 91%    Intake/Output Summary (Last 24 hours) at 03/18/2023 2154 Last data filed at 03/18/2023 2149 Gross per 24 hour  Intake 2.96 ml  Output --  Net 2.96 ml   General:  Well nourished, well developed, in no acute distress HEENT: normal Neck: no jugular venous distension appreciated.  Endocrine:  No thyromegaly Cardiac:  normal S1, S2; RRR; no murmur Lungs:  clear to auscultation bilaterally, no wheezing, rhonchi or rales  Abd: soft, nontender, no hepatomegaly  Ext: no edema, diminished bilateral pedal pulses. 2+ radial pulses. Normal right radial angiogram puncture site. No hematoma or excess bruising present.  Musculoskeletal:  No deformities, BUE and BLE strength normal and equal Skin: warm and dry  Psych:  Normal affect   EKG:  Sinus tachycardia with normal axis and intervals.No ST segment depression or TWI. PVC present.   Relevant CV Studies: Coronary Angiogram 2/12/205 (Personally reviewed)  1.  Severe diffuse calcific proximal LAD stenosis treated successfully with a 3.0 x 38 mm Synergy DES postdilated to high-pressure and guided by intravascular ultrasound 2.  Severe focal mid RCA stenosis treated with a 3.5 x 16 mm Synergy DES guided by intravascular ultrasound 3.  Patent left main and left circumflex vessels with no significant stenoses  Laboratory Data:  Chemistry Recent Labs  Lab 03/18/23 1822  NA 138  K 3.8  CL 105  CO2 21*   GLUCOSE 114*  BUN 20  CREATININE 1.01  CALCIUM 9.3  GFRNONAA >60  ANIONGAP 12    Recent Labs  Lab 03/18/23 1822  PROT 6.4*  ALBUMIN 3.6  AST 29  ALT 27  ALKPHOS 57  BILITOT 0.7   Hematology Recent Labs  Lab 03/18/23 1822  WBC 7.6  RBC 4.92  HGB 14.4  HCT 42.8  MCV 87.0  MCH 29.3  MCHC 33.6  RDW 12.5  PLT 230   Cardiac EnzymesNo results for input(s): "TROPONINI" in the last 168 hours. No results for input(s): "TROPIPOC" in the last 168 hours.  BNPNo results for input(s): "BNP", "PROBNP" in the last 168 hours.  DDimer  Recent Labs  Lab 03/18/23 1822  DDIMER 0.91*    Radiology/Studies:  CT Angio Chest PE W and/or Wo Contrast Result Date: 03/18/2023 CLINICAL DATA:  Pulmonary embolism (PE) suspected, high prob EXAM: CT ANGIOGRAPHY CHEST WITH CONTRAST TECHNIQUE: Multidetector CT imaging of the chest was performed using the standard protocol during bolus administration of intravenous contrast. Multiplanar CT image reconstructions and MIPs were obtained to evaluate the vascular anatomy. RADIATION DOSE REDUCTION: This exam was performed according to the departmental dose-optimization program which includes automated exposure control, adjustment of the mA and/or kV according to patient size and/or use of iterative reconstruction technique. CONTRAST:  75mL OMNIPAQUE IOHEXOL 350 MG/ML SOLN COMPARISON:  02/05/2022, 10/18/2016 FINDINGS: Cardiovascular: Satisfactory opacification of the pulmonary arteries to the segmental level. No evidence of pulmonary embolism. Thoracic aorta is nonaneurysmal. Aortic and coronary artery atherosclerosis. Normal heart size. No pericardial effusion. Mediastinum/Nodes: No enlarged mediastinal, hilar, or axillary lymph nodes. Thyroid gland, trachea, and esophagus demonstrate no significant findings. Lungs/Pleura: Minimal dependent bibasilar atelectasis. Lungs are otherwise clear. No pleural effusion or pneumothorax. Upper Abdomen: Stable 2.4 cm left adrenal  gland nodule with internal density of 18 HU. Colonic diverticulosis. No acute findings. Musculoskeletal: No chest wall abnormality. No acute or significant osseous findings. Review of the MIP images confirms the above findings. IMPRESSION: 1. No evidence of pulmonary embolism or other acute intrathoracic findings. 2. Aortic and coronary artery atherosclerosis (ICD10-I70.0). 3. Colonic diverticulosis. 4. Stable 2.4 cm left adrenal gland nodule, likely benign adenoma. No follow-up imaging recommended. Electronically Signed   By: Duanne Guess D.O.   On: 03/18/2023 20:42   DG Chest Port 1 View Result Date: 03/18/2023 CLINICAL DATA:  Chest pain.  Stent placement 03/15/2023 EXAM: PORTABLE CHEST 1 VIEW COMPARISON:  Chest x-ray 10/02/2020 FINDINGS: The heart and mediastinal contours are within normal limits. No focal consolidation. No pulmonary edema. No pleural effusion. No pneumothorax. No acute osseous abnormality. IMPRESSION: No active disease. Electronically Signed   By: Tish Frederickson M.D.   On: 03/18/2023 19:12    Assessment and Plan:   Mr. Clinkscales presents to the emergency department with a diffuse broad pain beginning in his abdomen and extending to his neck area. Cardiology initially consulted given his recent PCI in the last two days to identify if this new discomfort could be ischemia driven. EKG with sinus tachycardia and without ST deviation and TWI, high sensitivity troponins obtained several hours after the onset of this discomfort were 25 and 27, and there was no significant improvement in his discomfort after the nitroglycerin was received. The above workup has ruled out an ischemia driven etiology. On examination, he appears tender in his epigastric region. I wonder if his symptoms are due to increased gastric acid secretion. I discussed with Mr. Browning and  his wife, Victorino Dike about the above findings and my assessment. I will notify his cardiologist, Dr. Excell Seltzer of his presentation to the emergency  department.   - Discontinue Nitroglycerin gtt  - Can attempt GI cocktail to see if there is relief  - Discontinue sublingual nitroglycerin given interaction with his daily tadalafil   Signed, Urban Gibson, MD  03/18/2023 9:54 PM

## 2023-03-18 NOTE — ED Triage Notes (Signed)
 Pt BIB [facility] from home d/t CP that started Earlier today. Going on for few hours on the upper chest. Non-radiating. 2 nitroglycerin and 3 asa. W EMS. Had another 2 nitroglycerin and 1 asa by himself. Unremarkable EKG w EMS. 2 stents placed 2 days ago. VSS. A&O X4.

## 2023-03-18 NOTE — ED Notes (Signed)
 Patient transported to CT

## 2023-03-20 MED FILL — Lidocaine HCl Local Soln Prefilled Syringe 100 MG/5ML (2%): INTRAMUSCULAR | Qty: 5 | Status: AC

## 2023-03-22 ENCOUNTER — Telehealth: Payer: Self-pay

## 2023-03-22 NOTE — Transitions of Care (Post Inpatient/ED Visit) (Signed)
   03/22/2023  Name: Harry Nash MRN: [id number] DOB: 02-20-1955  Today's TOC FU Call Status: Today's TOC FU Call Status:: Unsuccessful Call (1st Attempt) Unsuccessful Call (1st Attempt) Date: 03/22/23  Attempted to reach the patient regarding the most recent Inpatient/ED visit.  Left HIPAA compliant VM  Follow Up Plan: Additional outreach attempts will be made to reach the patient to complete the Transitions of Care (Post Inpatient/ED visit) call.   Signature Elyse Jarvis, [place]

## 2023-03-23 ENCOUNTER — Telehealth (HOSPITAL_COMMUNITY): Payer: Self-pay

## 2023-03-23 NOTE — Telephone Encounter (Signed)
 Attempted to call patient in regards to Cardiac Rehab - LM on VM

## 2023-03-23 NOTE — Telephone Encounter (Signed)
 Pt insurance is active and benefits verified through [employer]. Co-pay $0.00, DED $1,000.00/$1,000.00 met, out of pocket $8,125.00/$6,765.90 met, co-insurance 10%. No pre-authorization required. [employer], 03/23/23 @ 1:43PM, REF#[id number]   How many CR sessions are covered? (36 visits for TCR, 72 visits for ICR)72 Is this a lifetime maximum or an annual maximum? Annual Has the member used any of these services to date? No Is there a time limit (weeks/months) on start of program and/or program completion? No     Will contact patient to see if he is interested in the Cardiac Rehab Program. If interested, patient will need to complete follow up appt. Once completed, patient will be contacted for scheduling upon review by the RN Navigator.

## 2023-03-27 ENCOUNTER — Telehealth: Payer: Self-pay | Admitting: Cardiology

## 2023-03-27 NOTE — Telephone Encounter (Signed)
 Pt called in asking to switch from Dr. Cristal Deer to Dr. Excell Seltzer, is this switch okay?

## 2023-04-03 ENCOUNTER — Ambulatory Visit: Payer: 59 | Admitting: Nurse Practitioner

## 2023-04-03 ENCOUNTER — Ambulatory Visit: Payer: 59 | Admitting: Family Medicine

## 2023-04-03 ENCOUNTER — Ambulatory Visit: Payer: 59 | Admitting: Cardiology

## 2023-04-04 ENCOUNTER — Encounter: Payer: Self-pay | Admitting: Family Medicine

## 2023-04-06 ENCOUNTER — Ambulatory Visit: Payer: 59 | Attending: Cardiovascular Disease | Admitting: Cardiovascular Disease

## 2023-04-06 ENCOUNTER — Encounter: Payer: Self-pay | Admitting: Cardiovascular Disease

## 2023-04-06 ENCOUNTER — Other Ambulatory Visit: Payer: Self-pay | Admitting: Internal Medicine

## 2023-04-06 VITALS — BP 124/76 | HR 100 | Resp 16 | Ht 71.0 in | Wt 191.6 lb

## 2023-04-06 DIAGNOSIS — I2510 Atherosclerotic heart disease of native coronary artery without angina pectoris: Secondary | ICD-10-CM

## 2023-04-06 DIAGNOSIS — E782 Mixed hyperlipidemia: Secondary | ICD-10-CM | POA: Diagnosis not present

## 2023-04-06 DIAGNOSIS — R002 Palpitations: Secondary | ICD-10-CM

## 2023-04-06 NOTE — Patient Instructions (Signed)
 Follow-Up: At [facility], you and your health needs are our priority.  As part of our continuing mission to provide you with exceptional heart care, we have created designated Provider Care Teams.  These Care Teams include your primary Cardiologist (physician) and Advanced Practice Providers (APPs -  Physician Assistants and Nurse Practitioners) who all work together to provide you with the care you need, when you need it.  Your next appointment:   6 month(s)  Provider:   Jodelle Red, MD

## 2023-04-06 NOTE — Assessment & Plan Note (Signed)
 The patient is doing well after his recent two-vessel PCI.  I would be inclined to keep him on aspirin and clopidogrel for 12 months since he underwent proximal LAD stenting and seems to be tolerating this well.  After 1 year, he could continue on aspirin 81 mg daily.

## 2023-04-06 NOTE — Progress Notes (Signed)
 Cardiology Office Note:    Date:  04/06/2023   ID:  Harry Nash, DOB 05/27/1955, MRN [id number]  PCP:  Etta Grandchild, MD   [facility] Providers Cardiologist:  Jodelle Red, MD     Referring MD: Etta Grandchild, MD   Chief Complaint  Patient presents with   Coronary artery disease involving native coronary artery of   Agatston CAC score, >400   Follow-up    History of Present Illness:    Harry Nash is a 68 y.o. male with a hx of CAD, presenting for follow-up evaluation.  The patient was recently found to have an elevated coronary calcium score and he underwent a gated coronary CTA study.  CTA showed severe LAD stenosis, moderate circumflex stenosis, and mild RCA stenosis.  The patient was referred for cardiac catheterization. This demonstrated severe diffuse calcific proximal LAD stenosis and severe focal mid RCA stenosis.  The LAD was treated with a 3.0 x 38 mm Synergy DES and the RCA was treated with a 3.5 x 16 mm Synergy DES.  Both stents were implanted with guidance of intravascular ultrasound.  The left main and left circumflex had no significant stenoses.  The patient presented to the emergency department a few days later with chest pain.  High-sensitivity troponins were flat and minimally elevated.  There was low suspicion for acute ischemia and he was sent home from the ER.  He is doing better and presents today for follow-up.  The patient is here alone.  He reports no recurrent chest discomfort.  He had some concern about his heart rate being elevated.  Overall he feels significant improvement compared to before his PCI procedure.  States that he is not short of breath with exertion anymore.  He has had no recurrent chest discomfort.  Reports no other complaints today.  Current Medications: Current Meds  Medication Sig   aspirin EC 81 MG tablet Take 1 tablet (81 mg total) by mouth daily. Swallow whole.   buPROPion (WELLBUTRIN XL) 150 MG 24 hr  tablet Take 1 tablet (150 mg total) by mouth daily.   clopidogrel (PLAVIX) 75 MG tablet TAKE 1 TABLET (75 MG) BY MOUTH DAILY   metoprolol tartrate (LOPRESSOR) 25 MG tablet Take 0.5 tablets (12.5 mg total) by mouth 2 (two) times daily as needed. For heart rate >100   Pitavastatin Calcium 1 MG TABS Take 1 tablet (1 mg total) by mouth daily.   REPATHA SURECLICK 140 MG/ML SOAJ Inject 140 mg into the skin every 14 (fourteen) days.   vortioxetine HBr (TRINTELLIX) 5 MG TABS tablet Take 1 tablet (5 mg total) by mouth daily.     Allergies:   Gabapentin and Rosuvastatin   ROS:   Please see the history of present illness.    All other systems reviewed and are negative.  EKGs/Labs/Other Studies Reviewed:    The following studies were reviewed today: Cardiac Studies & Procedures   ______________________________________________________________________________________________ CARDIAC CATHETERIZATION  CARDIAC CATHETERIZATION 03/15/2023  Narrative 1.  Severe diffuse calcific proximal LAD stenosis treated successfully with a 3.0 x 38 mm Synergy DES postdilated to high-pressure and guided by intravascular ultrasound 2.  Severe focal mid RCA stenosis treated with a 3.5 x 16 mm Synergy DES guided by intravascular ultrasound 3.  Patent left main and left circumflex vessels with no significant stenoses  Recommendations: Patient has been on long-term clopidogrel.  Continue DAPT for at least 6 months then resume clopidogrel monotherapy.  Same day PCI protocol if criteria  met.  Findings Coronary Findings Diagnostic  Dominance: Right  Left Anterior Descending Ost LAD to Mid LAD lesion is 80% stenosed. The lesion is calcified.  Right Coronary Artery Mid RCA lesion is 75% stenosed. The lesion is eccentric.  Intervention  Ost LAD to Mid LAD lesion Stent [place] GUIDE 6FR XBLD 3.5 guide catheter was inserted. Pre-stent angioplasty was performed using a BALLN [place] EMERGE MR 2.5X20. A drug-eluting stent  was successfully placed using a SYNERGY XD 3.0X38. Maximum pressure: 16 atm. Stent strut is well apposed. Post-stent angioplasty was performed using a BALLN [place] EMERGE MR 3.25X15. Maximum pressure:  18 atm. Intravascular ultrasound was used to guide the procedure.  Due to areas of fairly heavy calcification, I elected to prep the lesion with a 2.5 x 20 mm noncompliant balloon which expanded well at high pressure.  The lesion is stented with a 3.0 x 38 mm Synergy DES so that there is full lesion coverage.  The stent is postdilated with a 3.25 mm noncompliant balloon high-pressure and final intravascular ultrasound demonstrates an excellent result with good stent expansion and apposition throughout.  There is no evidence of edge dissection. Post-Intervention Lesion Assessment The intervention was successful. Pre-interventional TIMI flow is 3. Post-intervention TIMI flow is 3. No complications occurred at this lesion. There is a 0% residual stenosis post intervention.  Mid RCA lesion Stent A drug-eluting stent was successfully placed using a SYNERGY XD 3.50X16. Post-stent angioplasty was performed using a BALLN [place] EMERGE MR 4.0X12. Intravascular ultrasound demonstrates a 3.5 to 4.0 mm vessel.  Following stenting, post intravascular ultrasound shows good stent expansion throughout with mild mall apposition.  Therefore, the stent is postdilated with a 4.0 mm noncompliant balloon demonstrating excellent stent expansion with the proximal step up and distal stepdown off the stent edges.  There is no evidence of edge dissection. Post-Intervention Lesion Assessment The intervention was successful. Pre-interventional TIMI flow is 3. Post-intervention TIMI flow is 3. No complications occurred at this lesion. There is a 0% residual stenosis post intervention.     ECHOCARDIOGRAM  ECHOCARDIOGRAM COMPLETE 11/10/2022  Narrative ECHOCARDIOGRAM REPORT    Patient Name:   Harry Nash Date of Exam:  11/10/2022 Medical Rec #:  [id number]         Height:       71.0 in Accession #:    [id number]        Weight:       195.0 lb Date of Birth:  January 20, 1956          BSA:          2.086 m Patient Age:    67 years          BP:           112/81 mmHg Patient Gender: M                 HR:           93 bpm. Exam Location:  Outpatient  Procedure: 2D Echo, 3D Echo, Cardiac Doppler, Color Doppler and Strain Analysis  Indications:    R06.9 DOE  History:        Patient has prior history of Echocardiogram examinations, most recent 05/08/2019. TIA; Risk Factors:Dyslipidemia and Former Smoker.  Sonographer:    Jeryl Columbia RDCS Referring Phys: [id number] [facility] [place]  IMPRESSIONS   1. Left ventricular ejection fraction, by estimation, is 55 to 60%. Left ventricular ejection fraction by 3D volume is 58 %. The left ventricle has normal function.  The left ventricle has no regional wall motion abnormalities. There is mild concentric left ventricular hypertrophy. Left ventricular diastolic parameters are consistent with Grade I diastolic dysfunction (impaired relaxation). 2. Right ventricular systolic function is normal. The right ventricular size is mildly enlarged. There is normal pulmonary artery systolic pressure. The estimated right ventricular systolic pressure is 18.8 mmHg. 3. The mitral valve is normal in structure. No evidence of mitral valve regurgitation. No evidence of mitral stenosis. 4. The aortic valve is tricuspid. Aortic valve regurgitation is mild. Aortic valve sclerosis/calcification is present, without any evidence of aortic stenosis. 5. The inferior vena cava is normal in size with greater than 50% respiratory variability, suggesting right atrial pressure of 3 mmHg. 6. Ascending aorta measurements are within normal limits for age when indexed to body surface area.  FINDINGS Left Ventricle: Left ventricular ejection fraction, by estimation, is 55 to 60%. Left ventricular ejection  fraction by 3D volume is 58 %. The left ventricle has normal function. The left ventricle has no regional wall motion abnormalities. The left ventricular internal cavity size was normal in size. There is mild concentric left ventricular hypertrophy. Left ventricular diastolic parameters are consistent with Grade I diastolic dysfunction (impaired relaxation). Normal left ventricular filling pressure.  Right Ventricle: The right ventricular size is mildly enlarged. No increase in right ventricular wall thickness. Right ventricular systolic function is normal. There is normal pulmonary artery systolic pressure. The tricuspid regurgitant velocity is 1.99 m/s, and with an assumed right atrial pressure of 3 mmHg, the estimated right ventricular systolic pressure is 18.8 mmHg.  Left Atrium: Left atrial size was normal in size.  Right Atrium: Right atrial size was normal in size.  Pericardium: There is no evidence of pericardial effusion.  Mitral Valve: The mitral valve is normal in structure. No evidence of mitral valve regurgitation. No evidence of mitral valve stenosis.  Tricuspid Valve: The tricuspid valve is normal in structure. Tricuspid valve regurgitation is trivial. No evidence of tricuspid stenosis.  Aortic Valve: The aortic valve is tricuspid. Aortic valve regurgitation is mild. Aortic regurgitation PHT measures 349 msec. Aortic valve sclerosis/calcification is present, without any evidence of aortic stenosis.  Pulmonic Valve: The pulmonic valve was normal in structure. Pulmonic valve regurgitation is not visualized. No evidence of pulmonic stenosis.  Aorta: The aortic root is normal in size and structure. Ascending aorta measurements are within normal limits for age when indexed to body surface area.  Venous: The inferior vena cava is normal in size with greater than 50% respiratory variability, suggesting right atrial pressure of 3 mmHg.  IAS/Shunts: No atrial level shunt detected by  color flow Doppler.   LEFT VENTRICLE PLAX 2D LVIDd:         4.19 cm         Diastology LVIDs:         2.45 cm         LV e' medial:    6.31 cm/s LV PW:         1.44 cm         LV E/e' medial:  6.1 LV IVS:        1.25 cm         LV e' lateral:   4.68 cm/s LVOT diam:     2.20 cm         LV E/e' lateral: 8.2 LV SV:         46 LV SV Index:   22 LVOT Area:     3.80  cm        3D Volume EF LV 3D EF:    Left ventricul ar ejection fraction by 3D volume is 58 %.  3D Volume EF: 3D EF:        58 % LV EDV:       130 ml LV ESV:       55 ml LV SV:        75 ml  RIGHT VENTRICLE RV Basal diam:  4.42 cm RV Mid diam:    3.06 cm RV S prime:     13.50 cm/s TAPSE (M-mode): 2.9 cm  LEFT ATRIUM           Index        RIGHT ATRIUM           Index LA diam:      3.60 cm 1.73 cm/m   RA Area:     15.40 cm LA Vol (A2C): 58.5 ml 28.04 ml/m  RA Volume:   36.60 ml  17.55 ml/m LA Vol (A4C): 20.1 ml 9.64 ml/m AORTIC VALVE LVOT Vmax:   67.60 cm/s LVOT Vmean:  44.100 cm/s LVOT VTI:    0.122 m AI PHT:      349 msec  AORTA Ao Root diam: 3.80 cm Ao Asc diam:  3.90 cm  MITRAL VALVE               TRICUSPID VALVE MV Area (PHT): 3.93 cm    TR Peak grad:   15.8 mmHg MV Decel Time: 193 msec    TR Vmax:        199.00 cm/s MV E velocity: 38.40 cm/s MV A velocity: 65.10 cm/s  SHUNTS MV E/A ratio:  0.59        Systemic VTI:  0.12 m Systemic Diam: 2.20 cm  Armanda Magic MD Electronically signed by Armanda Magic MD Signature Date/Time: 11/10/2022/9:17:23 PM    Final    MONITORS  CARDIAC EVENT MONITOR 11/24/2022  Narrative ~30 days of data recorded on [employer] monitor. Patient had a min HR of 57 bpm, max HR of 160 bpm, and avg HR of 88 bpm. Predominant underlying rhythm was Sinus Rhythm. No VT, SVT, atrial fibrillation, high degree block, or pauses noted. Isolated atrial and ventricular ectopy was rare (2% or less). There were 7 triggered events, which were sinus with or without PVC.  No significant arrhythmias detected.   CT SCANS  CT CORONARY MORPH W/CTA COR W/SCORE 03/08/2023  Addendum 03/23/2023  2:32 AM ADDENDUM REPORT: 03/23/2023 02:30  EXAM: OVER-READ INTERPRETATION  CT CHEST  The following report is an over-read performed by radiologist Dr. Isac Sarna [facility] on 03/23/2023. This over-read does not include interpretation of cardiac or coronary anatomy or pathology. The CTA coronary angiogram interpretation by the cardiologist is attached.  COMPARISON:  CT chest without contrast for coronary artery calcium scoring 12/07/2022, PA and lateral chest 10/02/2020.  FINDINGS: Cardiovascular: See attached report from the cardiologist.  Mediastinum/nodes: No mass or adenopathy in the visualized mediastinum and hila. The distal thoracic esophagus is unremarkable. Visualized central airways are clear.  Lungs/pleura: Chronic elevation right hemidiaphragm. Hazy atelectasis posteriorly with no active infiltrate in the visualized lungs. No pleural effusion is seen. No basilar pneumothorax.  Upper abdomen: 1.1 cm cyst superiorly in hepatic segment 2. Mild hepatic steatosis.  Partially visible Bosniak 1 cyst superior pole right kidney. No acute findings in the imaged upper abdomen.  Musculoskeletal: Thoracic spondylosis.  IMPRESSION: 1. No acute noncardiac findings  in the visualized chest. 2. Chronic elevation right hemidiaphragm. 3. Mild hepatic steatosis. 4. Thoracic spondylosis.   Electronically Signed By: Almira Bar M.D. On: 03/23/2023 02:30  Narrative HISTORY: Chest pain/anginal equiv, high CAD risk, not treadmill candidate  EXAM: Cardiac/Coronary  CT  TECHNIQUE: The patient was scanned on a [employer].  PROTOCOL: A non-contrast, gated CT scan was obtained with axial slices of 3 mm through the heart for calcium scoring. Calcium scoring was performed using the Agatston method. A 120 kV prospective, gated,  contrast cardiac scan was obtained. Gantry rotation speed was 250 msecs and collimation was 0.6 mm. Two sublingual nitroglycerin tablets (0.8 mg) were given. The 3 D data set was reconstructed in 5% intervals of the 35-75% of the R-R cycle. Diastolic phases were analyzed on a dedicated workstation using MPR, MIP, and VRT modes. The patient received 95 cc of contrast.  FINDINGS: Image quality: Average.  Artifact: Limited.  Coronary artery calcification score:  Left main: 0  Left anterior descending artery: 458  Left circumflex artery: 200  Right coronary artery: 0.916  Total coronary calcium score of 659, places the patient at the 83rd percentile for age and sex matched control.  Coronary arteries: Normal coronary origins.  Right dominance.  Left Main Coronary Artery: Normal caliber vessel, originates from the left coronary cusp, bifurcates into a left anterior descending artery (LAD) and left circumflex artery (LCX). Left main is patent.  Left Anterior Descending Artery: Normal caliber vessel, reaches the apex, gives off 4 diagonal branches. Severe stenosis (70-99%) due to mixed plaque at the proximal LAD. Minimal calcified plaque burden within the mid LAD. Distal to apical segments are patent.  First Diagonal branch: Normal size and caliber vessel, minimal calcified plaque (0-24%) at the ostium otherwise vessel is patent.  Second Diagonal branch: Small, patent  Third Diagonal branch: Small, patent  Fourth Diagonal branch: Small, patent  Left Circumflex Artery: Normal caliber vessel, non-dominant, travels within the atrioventricular groove, gives off 2 obtuse marginal branches. Moderate stenosis (50-69%) due to mixed plaque at the ostial LCX. Mild stenosis (25-49%) due to calcified plaque at the mid LCX.  First Obtuse Marginal branch: Patent.  Second Obtuse Marginal branch: Normal caliber, large in size, patent.  Right Coronary Artery: Dominant vessel,  originates from the right coronary cusp, terminates as a PDA and right posterolateral branch. Mild stenosis (25-49%) due to soft plaque at the mid RCA.  Plaque Analysis:  Calcified plaque 134 mm3  Non-calcified plaque 459 mm3 (low attenuation plaque 17 mm3).  Total plaque volume is 593 mm3, categorized as severe plaque burden, which is 51st percentile for age- and sex-matched controls.  Left Atrium: Grossly normal in size with no left atrial appendage filling defect. Patent foramen ovale.  Left Ventricle: Grossly normal in size. There is no abnormal filling defect.  Pulmonary arteries: Normal in size without proximal filling defect.  Pulmonary veins: Normal pulmonary venous drainage.  Aorta: Normal size, 35 mm at the mid ascending aorta (level of the PA bifurcation) measured double oblique. Aortic atherosclerosis. No dissection.  Pericardium: Normal thickness with no significant effusion or calcium present.  Aortic Valve:Trileaflet without significant calcification.  Mitral Valve: Normal structure without significant calcification.  Extra-cardiac findings: See attached radiology report for non-cardiac structures.  IMPRESSION: 1. Total coronary calcium score of 659, places the patient at the 83rd percentile for age and sex matched control.  2. Total plaque volume is 593 mm3, categorized as severe plaque burden, which is 51st percentile for age-  and sex-matched controls.  3.  Normal coronary origin with right dominance.  4. CAD-RADS = 4 Severe coronary artery disease, summarized findings noted below.  Left Main: Patent.  LAD: Severe stenosis (70-99%) due to mixed plaque at the proximal LAD.  LCX: Moderate stenosis (50-69%) due to mixed plaque at the ostial LCX.  RCA: Mild stenosis (25-49%) due to soft plaque at the mid RCA.  5.  Aortic atherosclerosis.  6.  Patent foramen ovale.  7. Study is sent for CT-FFR to further evaluate LAD and LCx . Findings will  be performed and reported separately.  RECOMMENDATIONS:  Will send the study to CT-FFR for further evaluation.  Consider symptom-guided anti-ischemic pharmacotherapy as well as risk factor modification per guideline directed care.  Invasive coronary angiography recommended with revascularization per published guideline statements if either CT-FFR notes hemodynamically significant stenosis or continues to have symptoms despite up titration of medical therapy.  Electronically Signed: By: Tessa Lerner D.O. On: 03/08/2023 16:31     ______________________________________________________________________________________________      EKG:        Recent Labs: 10/10/2022: TSH 1.060 03/18/2023: ALT 27; BUN 20; Creatinine, Ser 1.01; Hemoglobin 14.4; Platelets 230; Potassium 3.8; Sodium 138  Recent Lipid Panel    Component Value Date/Time   CHOL 145 10/10/2022 1218   TRIG 156 (H) 10/10/2022 1218   HDL 54 10/10/2022 1218   CHOLHDL 2.7 10/10/2022 1218   CHOLHDL 3 08/27/2021 1125   VLDL 13.4 08/27/2021 1125   LDLCALC 65 10/10/2022 1218   LDLDIRECT 60 03/09/2023 1213   LDLDIRECT 117 (H) 02/12/2010 2025     Risk Assessment/Calculations:                Physical Exam:    VS:  BP 124/76 (BP Location: Left Arm, Patient Position: Sitting, Cuff Size: Normal)   Pulse 100   Resp 16   Ht 5\' 11"  (1.803 m)   Wt 191 lb 9.6 oz (86.9 kg)   SpO2 96%   BMI 26.72 kg/m     Wt Readings from Last 3 Encounters:  04/06/23 191 lb 9.6 oz (86.9 kg)  03/15/23 191 lb (86.6 kg)  03/09/23 190 lb (86.2 kg)     GEN:  Well nourished, well developed in no acute distress HEENT: Normal NECK: No JVD; No carotid bruits LYMPHATICS: No lymphadenopathy CARDIAC: RRR, no murmurs, rubs, gallops RESPIRATORY:  Clear to auscultation without rales, wheezing or rhonchi  ABDOMEN: Soft, non-tender, non-distended MUSCULOSKELETAL:  No edema; No deformity  SKIN: Warm and dry NEUROLOGIC:  Alert and oriented x  3 PSYCHIATRIC:  Normal affect   Assessment & Plan Coronary artery disease involving native coronary artery of native heart without angina pectoris The patient is doing well after his recent two-vessel PCI.  I would be inclined to keep him on aspirin and clopidogrel for 12 months since he underwent proximal LAD stenting and seems to be tolerating this well.  After 1 year, he could continue on aspirin 81 mg daily. Mixed hyperlipidemia Continue evolocumab and pitavastatin.  He has a follow-up visit at the Pharm.D. clinic next month.  Last lipids reviewed and show LDL cholesterol of 65 mg/dL, total cholesterol 161, HDL 54.  Palpitations The patient has some concerned about his elevated heart rate.  His EKGs and event monitor were reviewed and he does have an average heart rate of 88 bpm with some heart rates running around 100 bpm.  All of this is sinus rhythm and I do not think there is  any major concern with this.  As he is normotensive, I would not recommend starting him on a beta-blocker unless he becomes increasingly symptomatic.  For follow-up, the patient will see Dr. Cristal Deer back in about 6 months.  I would be happy to see him in the future if problems arise.     Cardiac Rehabilitation Eligibility Assessment  The patient is ready to start cardiac rehabilitation from a cardiac standpoint.          Medication Adjustments/Labs and Tests Ordered: Current medicines are reviewed at length with the patient today.  Concerns regarding medicines are outlined above.  No orders of the defined types were placed in this encounter.  No orders of the defined types were placed in this encounter.   Patient Instructions  Follow-Up: At [facility], you and your health needs are our priority.  As part of our continuing mission to provide you with exceptional heart care, we have created designated Provider Care Teams.  These Care Teams include your primary Cardiologist (physician) and  Advanced Practice Providers (APPs -  Physician Assistants and Nurse Practitioners) who all work together to provide you with the care you need, when you need it.  Your next appointment:   6 month(s)  Provider:   Jodelle Red, MD        Signed, Tonny Bollman, MD  04/06/2023 12:50 PM    [facility]

## 2023-04-07 ENCOUNTER — Telehealth (HOSPITAL_COMMUNITY): Payer: Self-pay

## 2023-04-07 NOTE — Telephone Encounter (Signed)
 Called and spoke with patient regarding cardiac rehab. Pt stated he is unable to participate at this time due to home schedule.    Close referral.

## 2023-05-05 ENCOUNTER — Ambulatory Visit: Payer: 59 | Attending: Internal Medicine | Admitting: Pharmacist

## 2023-05-05 ENCOUNTER — Encounter: Payer: Self-pay | Admitting: Pharmacist

## 2023-05-05 DIAGNOSIS — R931 Abnormal findings on diagnostic imaging of heart and coronary circulation: Secondary | ICD-10-CM | POA: Diagnosis not present

## 2023-05-05 DIAGNOSIS — E782 Mixed hyperlipidemia: Secondary | ICD-10-CM | POA: Diagnosis not present

## 2023-05-05 DIAGNOSIS — E785 Hyperlipidemia, unspecified: Secondary | ICD-10-CM | POA: Diagnosis not present

## 2023-05-05 MED ORDER — EZETIMIBE 10 MG PO TABS: 10.0000 mg | ORAL_TABLET | Freq: Every day | ORAL | 3 refills | Status: DC

## 2023-05-05 NOTE — Assessment & Plan Note (Signed)
 Assessment and Plan: LDLc and TG slightly above the goal. Currently on pitavastatin 1 mg daily and Repatha 140 mg Q14D Tolerates current medications well  Previously tried statins includes rosuvastatin and atorvastatin - unable to tolerate due to myalgia  Do not want to try any other statin as he is tolerating the current one well Will add Zetia 10 mg to current medications , last LDLC was 60 and goal is <55 Repeat  lab on June 11,2025

## 2023-05-05 NOTE — Patient Instructions (Signed)
 Your Results:             Your most recent labs Goal  Total Cholesterol 145 < 200  Triglycerides 156 < 150  HDL (happy/good cholesterol) 54 > 40  LDL (lousy/bad cholesterol 65 < 55   Medication changes: Continue taking Repatha 140 mg every 14 days and pitavastatin 1mg  daily. Start taking ezetimibe 10 mg daily   Lab orders: We want to repeat labs after 2-3 months.  We will send you a lab order to remind you once we get closer to that time.

## 2023-05-05 NOTE — Progress Notes (Signed)
 Patient ID: Harry Nash                 DOB: [birth year]                    MRN: [id number]      HPI: Harry Nash is a 68 y.o. male patient referred to lipid clinic by Perlie Gold . PMH is significant for CAD, CAC score >400,s/p PCI LAD and RCA - DES,  hx of TIA  Patient currently on Repatha and pitavastatin last lipid lab LDLc 65 still above the goal. Patient was referred to lipid clinic.  Patient presented today for lipid clinic. Reports he has been taking Repatha and pitavastatin and tolerates that well. In the past he had tried atorvastatin and rosuvastatin but did not tolerated due to myalgia.eats home cooked meals and loves red meat and bread which he is working on to cut down. 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  Reviewed options for lowering LDL cholesterol, including ezetimibe, PCSK-9 inhibitors, bempedoic acid and inclisiran.  Discussed mechanisms of action, dosing, side effects and potential decreases in LDL cholesterol.  Also reviewed cost information and potential options for patient assistance.  Current Medications: Repatha 140 mg Q14D, pitavastatin 1 mg daily  Intolerances: rosuvastatin 40 mg - myalgia  Risk Factors: CAD, CAC score >400,s/p PCI LAD and RCA - DES,  hx of TIA, strong family hx of premature ASCVD ( father died at age 31 from MI)  LDL goal: <55  LDL cholesterol of 65 mg/dL, total cholesterol 409, HDL 54.  Diet: eats lots of fresh food, eats home made meals- he enjoys cooking Eats lots of red meat and bread  Lemonade mix, water, coffee with cream and sugar 2-3/day   Exercise: walking and ride bike with daughter - 2-3 times per week   Family History:   Relation Problem Comments  Mother (Deceased) Heart disease     Father (Deceased) Heart disease   Hypertension     Sister Metallurgist) Heart disease     Sister (Alive) Heart disease     Brother (Alive) Heart disease     Brother (Alive) Heart disease     Brother Metallurgist)    Brother (Deceased)      Social History:  Alcohol: once or twice a month 1 beer  Smoking: never  Labs:  Lipid Panel     Component Value Date/Time   CHOL 145 10/10/2022 1218   TRIG 156 (H) 10/10/2022 1218   HDL 54 10/10/2022 1218   CHOLHDL 2.7 10/10/2022 1218   CHOLHDL 3 08/27/2021 1125   VLDL 13.4 08/27/2021 1125   LDLCALC 65 10/10/2022 1218   LDLDIRECT 60 03/09/2023 1213   LDLDIRECT 117 (H) 02/12/2010 2025   LABVLDL 26 10/10/2022 1218    Past Medical History:  Diagnosis Date   Depression    Headache(784.0)    Kidney stones    TIA (transient ischemic attack)     Current Outpatient Medications on File Prior to Visit  Medication Sig Dispense Refill   aspirin EC 81 MG tablet Take 1 tablet (81 mg total) by mouth daily. Swallow whole. 90 tablet 3   buPROPion (WELLBUTRIN XL) 150 MG 24 hr tablet Take 1 tablet (150 mg total) by mouth daily. 90 tablet 1   clopidogrel (PLAVIX) 75 MG tablet TAKE 1 TABLET (75 MG) BY MOUTH DAILY 90 tablet 1   metoprolol tartrate (LOPRESSOR) 25 MG tablet Take 0.5 tablets (12.5 mg  total) by mouth 2 (two) times daily as needed. For heart rate >100 30 tablet 3   Pitavastatin Calcium 1 MG TABS Take 1 tablet (1 mg total) by mouth daily. 90 tablet 0   REPATHA SURECLICK 140 MG/ML SOAJ Inject 140 mg into the skin every 14 (fourteen) days. 6 mL 3   vortioxetine HBr (TRINTELLIX) 5 MG TABS tablet Take 1 tablet (5 mg total) by mouth daily. 90 tablet 1   No current facility-administered medications on file prior to visit.    Allergies  Allergen Reactions   Gabapentin Other (See Comments)    Clouded mentation   Rosuvastatin Other (See Comments)    myalgias    Assessment/Plan:  1. Hyperlipidemia -  Problem  Dyslipidemia, Goal Ldl Below 70   Current Medications: Repatha 140 mg Q14D, pitavastatin 1 mg daily  Intolerances: rosuvastatin 40 mg - myalgia  Risk Factors: CAD, CAC score >400,s/p PCI LAD and RCA - DES,  hx of TIA, strong family hx of premature  ASCVD ( father died at age 66 from MI)  LDL goal: <55  LDL cholesterol of 65 mg/dL, total cholesterol 161, HDL 54.     Dyslipidemia, goal LDL below 70 Assessment and Plan: LDLc and TG slightly above the goal. Currently on pitavastatin 1 mg daily and Repatha 140 mg Q14D Tolerates current medications well  Previously tried statins includes rosuvastatin and atorvastatin - unable to tolerate due to myalgia  Do not want to try any other statin as he is tolerating the current one well Will add Zetia 10 mg to current medications , last LDLC was 60 and goal is <55 Repeat  lab on June 11,2025      Thank you,  Carmela Hurt, Pharm.D [facility] Division of [facility] [address] [place], [place] [address]  Phone: [phone]; Fax: [phone]

## 2023-07-11 ENCOUNTER — Ambulatory Visit: Admitting: Family Medicine

## 2023-07-13 ENCOUNTER — Telehealth: Payer: Self-pay | Admitting: Internal Medicine

## 2023-07-13 NOTE — Telephone Encounter (Unsigned)
 Copied from CRM [id number]. Topic: Clinical - Medication Refill >> Jul 13, 2023  3:05 PM Legrand Puma J wrote: Medication:  Pitavastatin  Calcium  1 MG TABS clopidogrel  (PLAVIX ) 75 MG tablet  Has the patient contacted their pharmacy? No (Agent: If no, request that the patient contact the pharmacy for the refill. If patient does not wish to contact the pharmacy document the reason why and proceed with request.) (Agent: If yes, when and what did the pharmacy advise?)  This is the patient's preferred pharmacy:  [id number] [place], [place] - [address] [place] [address] Phone: [phone] Fax: [phone]  Is this the correct pharmacy for this prescription? Yes   If no, delete pharmacy and type the correct one.   Has the prescription been filled recently? No  Is the patient out of the medication? Yes Pitavastatin  Calcium  1 MG TABS  Has the patient been seen for an appointment in the last year OR does the patient have an upcoming appointment? Yes  Can we respond through MyChart? Yes  Agent: Please be advised that Rx refills may take up to 3 business days. We ask that you follow-up with your pharmacy.

## 2023-07-31 NOTE — Progress Notes (Unsigned)
 [facility] [address] [place] [address] Phone: [phone] Subjective:   ISusannah Gully, am serving as a scribe for Dr. Arthea Claudene.  I'm seeing this patient by the request  of:  [name], MD  CC: right elbow pain   YEP:Dlagzrupcz  03/08/2022 Patient still feels like it is more muscular than anything else.  We discussed potentially using the muscle relaxer and given Zanaflex  for breakthrough.  Patient does not like gabapentin  and we will avoid any neuromodulator.  Patient has had this pain intermittently for quite some time and we have discussed the possibility of advanced imaging.  Patient does not believe that this would change his medical management at this time and instead wanted to work with athletic trainer to return home exercises.  We discussed formal physical therapy but patient would like to try the home exercises first and follow-up with me again in 6 to 8 weeks otherwise.  Total time reviewing outside records, previous x-rays, previous labs 32 minutes     Updated 08/02/2023 Harry Nash is a 68 y.o. male coming in with complaint of arm pain. R elbow pain fro about a month. Pain is about the same. No home therapies. No MOI. Pain located lateral side.      Past Medical History:  Diagnosis Date   Depression    Headache(784.0)    Kidney stones    TIA (transient ischemic attack)    Past Surgical History:  Procedure Laterality Date   CORONARY STENT INTERVENTION N/A 03/15/2023   Procedure: CORONARY STENT INTERVENTION;  Surgeon: Wonda Sharper, MD;  Location: [facility] INVASIVE CV LAB;  Service: Cardiovascular;  Laterality: N/A;   CORONARY ULTRASOUND/IVUS N/A 03/15/2023   Procedure: Coronary Ultrasound/IVUS;  Surgeon: Wonda Sharper, MD;  Location: [facility] INVASIVE CV LAB;  Service: Cardiovascular;  Laterality: N/A;   HERNIA REPAIR     LEFT HEART CATH AND CORONARY ANGIOGRAPHY N/A 03/15/2023   Procedure: LEFT HEART CATH AND CORONARY  ANGIOGRAPHY;  Surgeon: Wonda Sharper, MD;  Location: [facility] INVASIVE CV LAB;  Service: Cardiovascular;  Laterality: N/A;   ROTATOR CUFF REPAIR     Social History   Socioeconomic History   Marital status: Married    Spouse name: Not on file   Number of children: 3   Years of education: Not on file   Highest education level: Master's degree (e.g., MA, MS, MEng, MEd, MSW, MBA)  Occupational History   Not on file  Tobacco Use   Smoking status: Former    Current packs/day: 0.00    Types: Cigarettes    Quit date: 02/01/2003    Years since quitting: 20.5   Smokeless tobacco: Never  Vaping Use   Vaping status: Never Used  Substance and Sexual Activity   Alcohol use: Yes    Alcohol/week: 1.0 standard drink of alcohol    Types: 1 Cans of beer per week    Comment: moderate   Drug use: No   Sexual activity: Yes  Other Topics Concern   Not on file  Social History Narrative   Not on file   Social Drivers of Health   Financial Resource Strain: Low Risk  (11/15/2022)   Overall Financial Resource Strain (CARDIA)    Difficulty of Paying Living Expenses: Not hard at all  Food Insecurity: No Food Insecurity (11/15/2022)   Hunger Vital Sign    Worried About Running Out of Food in the Last Year: Never true    Ran Out of Food  in the Last Year: Never true  Transportation Needs: No Transportation Needs (11/15/2022)   PRAPARE - Administrator, Civil Service (Medical): No    Lack of Transportation (Non-Medical): No  Physical Activity: Insufficiently Active (11/15/2022)   Exercise Vital Sign    Days of Exercise per Week: 4 days    Minutes of Exercise per Session: 30 min  Stress: No Stress Concern Present (11/15/2022)   [employer] of Occupational Health - Occupational Stress Questionnaire    Feeling of Stress : Not at all  Social Connections: Unknown (11/15/2022)   Social Connection and Isolation Panel    Frequency of Communication with Friends and Family: Three times a week     Frequency of Social Gatherings with Friends and Family: Once a week    Attends Religious Services: Patient declined    Database administrator or Organizations: No    Attends Engineer, structural: Not on file    Marital Status: Married  Recent Concern: Social Connections - Moderately Isolated (10/09/2022)   Social Connection and Isolation Panel    Frequency of Communication with Friends and Family: Three times a week    Frequency of Social Gatherings with Friends and Family: Once a week    Attends Religious Services: Never    Database administrator or Organizations: No    Attends Engineer, structural: Not on file    Marital Status: Married   Allergies  Allergen Reactions   Gabapentin  Other (See Comments)    Clouded mentation   Rosuvastatin  Other (See Comments)    myalgias   Family History  Problem Relation Age of Onset   Heart disease Mother    Heart disease Father    Hypertension Father    Heart disease Sister    Heart disease Sister    Heart disease Brother    Heart disease Brother      Current Outpatient Medications (Cardiovascular):    ezetimibe  (ZETIA ) 10 MG tablet, Take 1 tablet (10 mg total) by mouth daily.   metoprolol  tartrate (LOPRESSOR ) 25 MG tablet, Take 0.5 tablets (12.5 mg total) by mouth 2 (two) times daily as needed. For heart rate >100   Pitavastatin  Calcium  1 MG TABS, Take 1 tablet (1 mg total) by mouth daily.   REPATHA  SURECLICK 140 MG/ML SOAJ, Inject 140 mg into the skin every 14 (fourteen) days.   Current Outpatient Medications (Analgesics):    aspirin  EC 81 MG tablet, Take 1 tablet (81 mg total) by mouth daily. Swallow whole.  Current Outpatient Medications (Hematological):    clopidogrel  (PLAVIX ) 75 MG tablet, TAKE 1 TABLET (75 MG) BY MOUTH DAILY  Current Outpatient Medications (Other):    buPROPion  (WELLBUTRIN  XL) 150 MG 24 hr tablet, Take 1 tablet (150 mg total) by mouth daily.   vortioxetine  HBr (TRINTELLIX ) 5 MG TABS  tablet, Take 1 tablet (5 mg total) by mouth daily.   Reviewed prior external information including notes and imaging from  primary care provider As well as notes that were available from care everywhere and other healthcare systems.  Past medical history, social, surgical and family history all reviewed in electronic medical record.  No pertanent information unless stated regarding to the chief complaint.   Review of Systems:  No headache, visual changes, nausea, vomiting, diarrhea, constipation, dizziness, abdominal pain, skin rash, fevers, chills, night sweats, weight loss, swollen lymph nodes, body aches, joint swelling, chest pain, shortness of breath, mood changes. POSITIVE muscle aches  Objective  Blood  pressure 118/76, pulse 96, height 5' 11 (1.803 m), weight 192 lb (87.1 kg), SpO2 98%.   General: No apparent distress alert and oriented x3 mood and affect normal, dressed appropriately.  HEENT: Pupils equal, extraocular movements intact  Respiratory: Patient's speak in full sentences and does not appear short of breath  Cardiovascular: No lower extremity edema, non tender, no erythema  Rght elbow ttp on lateral epicondyle  Pain with resisted extension of the wrist.  Patient has good grip strength noted.  No pain in the neck but does have limited range of motion of the neck.  Limited muscular skeletal ultrasound was performed and interpreted by CLAUDENE HUSSAR, M  Limited ultrasound shows the patient does have hypoechoic changes that is consistent with a partial tear of approximately high-grade of the common extensor tendon at the origin off of the lateral epicondylar region.  No avulsion fracture noted. Impression: Common extensor tear with mild retraction likely high-grade    Impression and Recommendations:    The above documentation has been reviewed and is accurate and complete [place] Korver M Whitfield Dulay, DO

## 2023-08-02 ENCOUNTER — Other Ambulatory Visit: Payer: Self-pay

## 2023-08-02 ENCOUNTER — Ambulatory Visit: Admitting: Family Medicine

## 2023-08-02 VITALS — BP 118/76 | HR 96 | Ht 71.0 in | Wt 192.0 lb

## 2023-08-02 DIAGNOSIS — M25521 Pain in right elbow: Secondary | ICD-10-CM

## 2023-08-02 DIAGNOSIS — S56511A Strain of other extensor muscle, fascia and tendon at forearm level, right arm, initial encounter: Secondary | ICD-10-CM | POA: Diagnosis not present

## 2023-08-02 NOTE — Patient Instructions (Addendum)
 Good to see you! Brace day and night for 2 weeks Then only at night for another 2 weeks PT [place] Avoid overhand lifting Ice See you again in 2 months

## 2023-08-03 ENCOUNTER — Encounter: Payer: Self-pay | Admitting: Family Medicine

## 2023-08-03 DIAGNOSIS — S56511A Strain of other extensor muscle, fascia and tendon at forearm level, right arm, initial encounter: Secondary | ICD-10-CM | POA: Insufficient documentation

## 2023-08-03 NOTE — Assessment & Plan Note (Signed)
 Partial tear noted.  Discussed different treatment options including nitroglycerin  which we will hold at this time.  Will start with bracing, home exercises, formal physical therapy.  Discussed avoiding overhead lifting.  Follow-up again in 6 to 8 weeks worsening pain can consider injection and would consider the PRP injection.

## 2023-08-21 ENCOUNTER — Telehealth: Payer: Self-pay

## 2023-08-21 NOTE — Telephone Encounter (Signed)
 Faxed Referral and patient information to [facility] as requested. Fax via Epic (ROI) to [phone].

## 2023-08-31 ENCOUNTER — Other Ambulatory Visit: Payer: Self-pay | Admitting: Internal Medicine

## 2023-08-31 DIAGNOSIS — R931 Abnormal findings on diagnostic imaging of heart and coronary circulation: Secondary | ICD-10-CM

## 2023-08-31 DIAGNOSIS — E785 Hyperlipidemia, unspecified: Secondary | ICD-10-CM

## 2023-08-31 NOTE — Telephone Encounter (Signed)
 Copied from CRM [id number]. Topic: Clinical - Medication Refill >> Aug 31, 2023 12:30 PM Franky GRADE wrote: Medication: Pitavastatin  Calcium  1 MG TABS [[id number]]  Has the patient contacted their pharmacy? Yes, current pharmacy had no record of the medication and asked patient to contact his primary care provider. (Agent: If no, request that the patient contact the pharmacy for the refill. If patient does not wish to contact the pharmacy document the reason why and proceed with request.) (Agent: If yes, when and what did the pharmacy advise?)  This is the patient's preferred pharmacy:  [facility] [id number] [place], [place] - [address] [place] [address] Phone: [phone] Fax: [phone]  Is this the correct pharmacy for this prescription? Yes If no, delete pharmacy and type the correct one.   Has the prescription been filled recently? No  Is the patient out of the medication? Yes  Has the patient been seen for an appointment in the last year OR does the patient have an upcoming appointment? Yes  Can we respond through MyChart? No, phone call  Agent: Please be advised that Rx refills may take up to 3 business days. We ask that you follow-up with your pharmacy.

## 2023-09-06 ENCOUNTER — Encounter: Payer: Self-pay | Admitting: Internal Medicine

## 2023-09-07 ENCOUNTER — Ambulatory Visit: Admitting: Family Medicine

## 2023-09-11 MED ORDER — PITAVASTATIN CALCIUM 1 MG PO TABS: 1.0000 mg | ORAL_TABLET | Freq: Every day | ORAL | 0 refills | Status: DC

## 2023-09-12 ENCOUNTER — Other Ambulatory Visit: Payer: Self-pay | Admitting: Internal Medicine

## 2023-09-12 DIAGNOSIS — E785 Hyperlipidemia, unspecified: Secondary | ICD-10-CM

## 2023-09-12 DIAGNOSIS — R931 Abnormal findings on diagnostic imaging of heart and coronary circulation: Secondary | ICD-10-CM

## 2023-09-12 MED ORDER — PITAVASTATIN CALCIUM 1 MG PO TABS: 1.0000 mg | ORAL_TABLET | Freq: Every day | ORAL | 0 refills | Status: DC

## 2023-10-03 NOTE — Progress Notes (Unsigned)
 [facility] [address] [place] [address] Phone: [phone] Subjective:   Harry [facility], am serving as a scribe for Dr. Arthea Claudene.  I'm seeing this patient by the request  of:  [name], MD  CC: right elbow pain   YEP:Dlagzrupcz  08/02/2023 Partial tear noted.  Discussed different treatment options including nitroglycerin  which we will hold at this time.  Will start with bracing, home exercises, formal physical therapy.  Discussed avoiding overhead lifting.  Follow-up again in 6 to 8 weeks worsening pain can consider injection and would consider the PRP injection.      Update 10/04/2023 Harry Nash is a 68 y.o. male coming in with complaint of R elbow pain. Patient states pain not worse, doing about the same. Has been doing physical therapy, feels like its gotten stronger but it unfortunately the pain is still there.  Not affecting daily activities though.      Past Medical History:  Diagnosis Date   Depression    Headache(784.0)    Kidney stones    TIA (transient ischemic attack)    Past Surgical History:  Procedure Laterality Date   CORONARY STENT INTERVENTION N/A 03/15/2023   Procedure: CORONARY STENT INTERVENTION;  Surgeon: Wonda Sharper, MD;  Location: [facility] INVASIVE CV LAB;  Service: Cardiovascular;  Laterality: N/A;   CORONARY ULTRASOUND/IVUS N/A 03/15/2023   Procedure: Coronary Ultrasound/IVUS;  Surgeon: Wonda Sharper, MD;  Location: [facility] INVASIVE CV LAB;  Service: Cardiovascular;  Laterality: N/A;   HERNIA REPAIR     LEFT HEART CATH AND CORONARY ANGIOGRAPHY N/A 03/15/2023   Procedure: LEFT HEART CATH AND CORONARY ANGIOGRAPHY;  Surgeon: Wonda Sharper, MD;  Location: [facility] INVASIVE CV LAB;  Service: Cardiovascular;  Laterality: N/A;   ROTATOR CUFF REPAIR     Social History   Socioeconomic History   Marital status: Married    Spouse name: Not on file   Number of children: 3   Years of education: Not on file    Highest education level: Master's degree (e.g., MA, MS, MEng, MEd, MSW, MBA)  Occupational History   Not on file  Tobacco Use   Smoking status: Former    Current packs/day: 0.00    Types: Cigarettes    Quit date: 02/01/2003    Years since quitting: 20.6   Smokeless tobacco: Never  Vaping Use   Vaping status: Never Used  Substance and Sexual Activity   Alcohol use: Yes    Alcohol/week: 1.0 standard drink of alcohol    Types: 1 Cans of beer per week    Comment: moderate   Drug use: No   Sexual activity: Yes  Other Topics Concern   Not on file  Social History Narrative   Not on file   Social Drivers of Health   Financial Resource Strain: Low Risk  (11/15/2022)   Overall Financial Resource Strain (CARDIA)    Difficulty of Paying Living Expenses: Not hard at all  Food Insecurity: No Food Insecurity (11/15/2022)   Hunger Vital Sign    Worried About Running Out of Food in the Last Year: Never true    Ran Out of Food in the Last Year: Never true  Transportation Needs: No Transportation Needs (11/15/2022)   PRAPARE - Administrator, Civil Service (Medical): No    Lack of Transportation (Non-Medical): No  Physical Activity: Insufficiently Active (11/15/2022)   Exercise Vital Sign    Days of Exercise per Week: 4 days  Minutes of Exercise per Session: 30 min  Stress: No Stress Concern Present (11/15/2022)   [facility] - Occupational Stress Questionnaire    Feeling of Stress : Not at all  Social Connections: Unknown (11/15/2022)   Social Connection and Isolation Panel    Frequency of Communication with Friends and Family: Three times a week    Frequency of Social Gatherings with Friends and Family: Once a week    Attends Religious Services: Patient declined    Database administrator or Organizations: No    Attends Engineer, structural: Not on file    Marital Status: Married  Recent Concern: Social Connections - Moderately  Isolated (10/09/2022)   Social Connection and Isolation Panel    Frequency of Communication with Friends and Family: Three times a week    Frequency of Social Gatherings with Friends and Family: Once a week    Attends Religious Services: Never    Database administrator or Organizations: No    Attends Engineer, structural: Not on file    Marital Status: Married   Allergies  Allergen Reactions   Gabapentin  Other (See Comments)    Clouded mentation   Rosuvastatin  Other (See Comments)    myalgias   Family History  Problem Relation Age of Onset   Heart disease Mother    Heart disease Father    Hypertension Father    Heart disease Sister    Heart disease Sister    Heart disease Brother    Heart disease Brother      Current Outpatient Medications (Cardiovascular):    ezetimibe  (ZETIA ) 10 MG tablet, Take 1 tablet (10 mg total) by mouth daily.   metoprolol  tartrate (LOPRESSOR ) 25 MG tablet, Take 0.5 tablets (12.5 mg total) by mouth 2 (two) times daily as needed. For heart rate >100   Pitavastatin  Calcium  1 MG TABS, Take 1 tablet (1 mg total) by mouth daily.   REPATHA  SURECLICK 140 MG/ML SOAJ, Inject 140 mg into the skin every 14 (fourteen) days.   Current Outpatient Medications (Analgesics):    aspirin  EC 81 MG tablet, Take 1 tablet (81 mg total) by mouth daily. Swallow whole.  Current Outpatient Medications (Hematological):    clopidogrel  (PLAVIX ) 75 MG tablet, TAKE 1 TABLET (75 MG) BY MOUTH DAILY  Current Outpatient Medications (Other):    buPROPion  (WELLBUTRIN  XL) 150 MG 24 hr tablet, Take 1 tablet (150 mg total) by mouth daily.   vortioxetine  HBr (TRINTELLIX ) 5 MG TABS tablet, Take 1 tablet (5 mg total) by mouth daily.   Reviewed prior external information including notes and imaging from  primary care provider As well as notes that were available from care everywhere and other healthcare systems.  Past medical history, social, surgical and family history all  reviewed in electronic medical record.  No pertanent information unless stated regarding to the chief complaint.   Review of Systems:  No headache, visual changes, nausea, vomiting, diarrhea, constipation, dizziness, abdominal pain, skin rash, fevers, chills, night sweats, weight loss, swollen lymph nodes, body aches, joint swelling, chest pain, shortness of breath, mood changes. POSITIVE muscle aches  Objective  Blood pressure 102/64, pulse 85, height 5' 11 (1.803 m), weight 196 lb (88.9 kg), SpO2 96%.   General: No apparent distress alert and oriented x3 mood and affect normal, dressed appropriately.  HEENT: Pupils equal, extraocular movements intact  Respiratory: Patient's speak in full sentences and does not appear short of breath  Cardiovascular: No lower extremity  edema, non tender, no erythema  Right elbow exam shows mild tenderness to palpation at the origin of the common extensor tendon.  Worsening pain with extension of the wrist.  Good grip strength noted.   Limited muscular skeletal ultrasound was performed and interpreted by CLAUDENE HUSSAR, M   Limited ultrasound shows the patient still has some hypoechoic changes near the insertion but no significant tearing actually noted with good scar tissue formation.  Neovascularization noted. Impression: Interval improvement but still significant hypoechoic changes in the surrounding tissues   Procedure: Real-time Ultrasound Guided Injection of right common extensor tendon sheath Device: GE Logiq Q7 Ultrasound guided injection is preferred based studies that show increased duration, increased effect, greater accuracy, decreased procedural pain, increased response rate, and decreased cost with ultrasound guided versus blind injection.  Verbal informed consent obtained.  Time-out conducted.  Noted no overlying erythema, induration, or other signs of local infection.  Skin prepped in a sterile fashion.  Local anesthesia: Topical Ethyl  chloride.  With sterile technique and under real time ultrasound guidance: With a 25-gauge half inch needle injected with 0.5 cc of 0.5% Marcaine  and 0.5 cc of Kenalog  40 mg/mL Completed without difficulty  Pain immediately resolved suggesting accurate placement of the medication.  Advised to call if fevers/chills, erythema, induration, drainage, or persistent bleeding.  Images saved Impression: Technically successful ultrasound guided injection. Impression and Recommendations:     The above documentation has been reviewed and is accurate and complete Juandavid Dallman M Ej Pinson, DO

## 2023-10-04 ENCOUNTER — Encounter: Payer: Self-pay | Admitting: Family Medicine

## 2023-10-04 ENCOUNTER — Ambulatory Visit: Admitting: Family Medicine

## 2023-10-04 ENCOUNTER — Other Ambulatory Visit: Payer: Self-pay

## 2023-10-04 VITALS — BP 102/64 | HR 85 | Ht 71.0 in | Wt 196.0 lb

## 2023-10-04 DIAGNOSIS — S56511A Strain of other extensor muscle, fascia and tendon at forearm level, right arm, initial encounter: Secondary | ICD-10-CM

## 2023-10-04 DIAGNOSIS — M25521 Pain in right elbow: Secondary | ICD-10-CM | POA: Diagnosis not present

## 2023-10-04 NOTE — Patient Instructions (Addendum)
 See you again in  2-3 months Also reach out if you want to get MRI Good to see you!

## 2023-10-04 NOTE — Assessment & Plan Note (Signed)
 Given injection and tolerated the procedure well, discussed icing regimen and home exercises, increase activity slowly.  If worsening pain could consider the possibility of PRP.  Hopeful that this will make a difference.  Also discussed the possibility MRI as well.  Follow-up again in 2 months

## 2023-10-13 ENCOUNTER — Encounter: Payer: Self-pay | Admitting: Cardiology

## 2023-10-26 ENCOUNTER — Other Ambulatory Visit (HOSPITAL_COMMUNITY): Payer: Self-pay

## 2023-10-26 ENCOUNTER — Telehealth: Payer: Self-pay | Admitting: Pharmacy Technician

## 2023-10-26 NOTE — Telephone Encounter (Signed)
 Pharmacy Patient Advocate Encounter   Received notification from Onbase that prior authorization for repatha  is required/requested.   Insurance verification completed.   The patient is insured through [facility] .   Per pa   We got request from ins to do pa but doesn't need one. Scanned in media

## 2023-10-30 ENCOUNTER — Other Ambulatory Visit: Payer: Self-pay | Admitting: Internal Medicine

## 2023-11-30 NOTE — Progress Notes (Signed)
 [facility] [address] [place] [address] Phone: [phone] Subjective:   [name], am serving as a scribe for Dr. Arthea Claudene.  I'm seeing this patient by the request  of:  [name], MD  CC: Right elbow pain  YEP:Dlagzrupcz  10/04/2023 Given injection and tolerated the procedure well, discussed icing regimen and home exercises, increase activity slowly.  If worsening pain could consider the possibility of PRP.  Hopeful that this will make a difference.  Also discussed the possibility MRI as well.  Follow-up again in 2 months      Update 12/04/2023 Harry Nash is a 68 y.o. male coming in with complaint of R elbow pain.  Found to have a partial tear of the common extensor tendon.  Given an injection at last follow-up 2 months ago.  Patient states that injection helped but pain has come back. Pain is improved from pain that he had prior to injection. Painful to pick up items.       Past Medical History:  Diagnosis Date   Depression    Headache(784.0)    Kidney stones    TIA (transient ischemic attack)    Past Surgical History:  Procedure Laterality Date   CORONARY STENT INTERVENTION N/A 03/15/2023   Procedure: CORONARY STENT INTERVENTION;  Surgeon: Wonda Sharper, MD;  Location: [facility] INVASIVE CV LAB;  Service: Cardiovascular;  Laterality: N/A;   CORONARY ULTRASOUND/IVUS N/A 03/15/2023   Procedure: Coronary Ultrasound/IVUS;  Surgeon: Wonda Sharper, MD;  Location: [facility] INVASIVE CV LAB;  Service: Cardiovascular;  Laterality: N/A;   HERNIA REPAIR     LEFT HEART CATH AND CORONARY ANGIOGRAPHY N/A 03/15/2023   Procedure: LEFT HEART CATH AND CORONARY ANGIOGRAPHY;  Surgeon: Wonda Sharper, MD;  Location: [facility] INVASIVE CV LAB;  Service: Cardiovascular;  Laterality: N/A;   ROTATOR CUFF REPAIR     Social History   Socioeconomic History   Marital status: Married    Spouse name: Not on file   Number of children: 3   Years of  education: Not on file   Highest education level: Master's degree (e.g., MA, MS, MEng, MEd, MSW, MBA)  Occupational History   Not on file  Tobacco Use   Smoking status: Former    Current packs/day: 0.00    Types: Cigarettes    Quit date: 02/01/2003    Years since quitting: 20.8   Smokeless tobacco: Never  Vaping Use   Vaping status: Never Used  Substance and Sexual Activity   Alcohol use: Yes    Alcohol/week: 1.0 standard drink of alcohol    Types: 1 Cans of beer per week    Comment: moderate   Drug use: No   Sexual activity: Yes  Other Topics Concern   Not on file  Social History Narrative   Not on file   Social Drivers of Health   Financial Resource Strain: Low Risk  (11/15/2022)   Overall Financial Resource Strain (CARDIA)    Difficulty of Paying Living Expenses: Not hard at all  Food Insecurity: No Food Insecurity (11/15/2022)   Hunger Vital Sign    Worried About Running Out of Food in the Last Year: Never true    Ran Out of Food in the Last Year: Never true  Transportation Needs: No Transportation Needs (11/15/2022)   PRAPARE - Administrator, Civil Service (Medical): No    Lack of Transportation (Non-Medical): No  Physical Activity: Insufficiently Active (11/15/2022)   Exercise Vital  Sign    Days of Exercise per Week: 4 days    Minutes of Exercise per Session: 30 min  Stress: No Stress Concern Present (11/15/2022)   [employer] of [employer]ational Health - Occupational Stress Questionnaire    Feeling of Stress : Not at all  Social Connections: Unknown (11/15/2022)   Social Connection and Isolation Panel    Frequency of Communication with Friends and Family: Three times a week    Frequency of Social Gatherings with Friends and Family: Once a week    Attends Religious Services: Patient declined    Database Administrator or Organizations: No    Attends Engineer, Structural: Not on file    Marital Status: Married  Recent Concern: Social  Connections - Moderately Isolated (10/09/2022)   Social Connection and Isolation Panel    Frequency of Communication with Friends and Family: Three times a week    Frequency of Social Gatherings with Friends and Family: Once a week    Attends Religious Services: Never    Database Administrator or Organizations: No    Attends Engineer, Structural: Not on file    Marital Status: Married   Allergies  Allergen Reactions   Gabapentin  Other (See Comments)    Clouded mentation   Rosuvastatin  Other (See Comments)    myalgias   Family History  Problem Relation Age of Onset   Heart disease Mother    Heart disease Father    Hypertension Father    Heart disease Sister    Heart disease Sister    Heart disease Brother    Heart disease Brother      Current Outpatient Medications (Cardiovascular):    ezetimibe  (ZETIA ) 10 MG tablet, Take 1 tablet (10 mg total) by mouth daily.   Pitavastatin  Calcium  1 MG TABS, Take 1 tablet (1 mg total) by mouth daily.   REPATHA  SURECLICK 140 MG/ML SOAJ, Inject 140 mg into the skin every 14 (fourteen) days.   metoprolol  tartrate (LOPRESSOR ) 25 MG tablet, Take 0.5 tablets (12.5 mg total) by mouth 2 (two) times daily as needed. For heart rate >100   Current Outpatient Medications (Analgesics):    aspirin  EC 81 MG tablet, Take 1 tablet (81 mg total) by mouth daily. Swallow whole.  Current Outpatient Medications (Hematological):    clopidogrel  (PLAVIX ) 75 MG tablet, TAKE 1 TABLET (75 MG) BY MOUTH DAILY  Current Outpatient Medications (Other):    buPROPion  (WELLBUTRIN  XL) 150 MG 24 hr tablet, Take 1 tablet (150 mg total) by mouth daily.   vortioxetine  HBr (TRINTELLIX ) 5 MG TABS tablet, Take 1 tablet (5 mg total) by mouth daily.   Reviewed prior external information including notes and imaging from  primary care provider As well as notes that were available from care everywhere and other healthcare systems.  Past medical history, social, surgical and  family history all reviewed in electronic medical record.  No pertanent information unless stated regarding to the chief complaint.   Review of Systems:  No headache, visual changes, nausea, vomiting, diarrhea, constipation, dizziness, abdominal pain, skin rash, fevers, chills, night sweats, weight loss, swollen lymph nodes, body aches, joint swelling, chest pain, shortness of breath, mood changes. POSITIVE muscle aches  Objective  Blood pressure (!) 128/90, pulse 81, height 5' 11 (1.803 m), weight 195 lb (88.5 kg), SpO2 97%.   General: No apparent distress alert and oriented x3 mood and affect normal, dressed appropriately.  HEENT: Pupils equal, extraocular movements intact  Respiratory: Patient's speak  in full sentences and does not appear short of breath  Cardiovascular: No lower extremity edema, non tender, no erythema  Elbow exam shows mild tenderness over the lateral epicondylar area.  Patient does have pain with resisted extension of the wrist.  Limited muscular skeletal ultrasound was performed and interpreted by CLAUDENE HUSSAR, M   Significant decrease in the inflammation noted.  Patient does have some mild increase in neovascularization over the common extensor tendon. Impression: Significant interval improvement.  Questionable still tearing noted of the proximal fibers.     Impression and Recommendations:     The above documentation has been reviewed and is accurate and complete Aristotelis Vilardi M Celestina Gironda, DO

## 2023-12-04 ENCOUNTER — Encounter: Payer: Self-pay | Admitting: Family Medicine

## 2023-12-04 ENCOUNTER — Other Ambulatory Visit: Payer: Self-pay

## 2023-12-04 ENCOUNTER — Ambulatory Visit (INDEPENDENT_AMBULATORY_CARE_PROVIDER_SITE_OTHER): Admitting: Family Medicine

## 2023-12-04 VITALS — BP 128/90 | HR 81 | Ht 71.0 in | Wt 195.0 lb

## 2023-12-04 DIAGNOSIS — M25521 Pain in right elbow: Secondary | ICD-10-CM | POA: Diagnosis not present

## 2023-12-04 NOTE — Patient Instructions (Signed)
 See me again in 2 months

## 2023-12-05 ENCOUNTER — Ambulatory Visit: Admitting: Internal Medicine

## 2023-12-26 ENCOUNTER — Ambulatory Visit: Admitting: Internal Medicine

## 2023-12-26 ENCOUNTER — Encounter: Payer: Self-pay | Admitting: Internal Medicine

## 2023-12-26 ENCOUNTER — Ambulatory Visit: Payer: Self-pay | Admitting: Internal Medicine

## 2023-12-26 VITALS — BP 114/84 | HR 95 | Temp 98.1°F | Ht 69.25 in | Wt 194.2 lb

## 2023-12-26 DIAGNOSIS — Z0001 Encounter for general adult medical examination with abnormal findings: Secondary | ICD-10-CM

## 2023-12-26 DIAGNOSIS — I2510 Atherosclerotic heart disease of native coronary artery without angina pectoris: Secondary | ICD-10-CM | POA: Diagnosis not present

## 2023-12-26 DIAGNOSIS — N400 Enlarged prostate without lower urinary tract symptoms: Secondary | ICD-10-CM

## 2023-12-26 DIAGNOSIS — E785 Hyperlipidemia, unspecified: Secondary | ICD-10-CM | POA: Diagnosis not present

## 2023-12-26 DIAGNOSIS — D126 Benign neoplasm of colon, unspecified: Secondary | ICD-10-CM

## 2023-12-26 DIAGNOSIS — D511 Vitamin B12 deficiency anemia due to selective vitamin B12 malabsorption with proteinuria: Secondary | ICD-10-CM | POA: Diagnosis not present

## 2023-12-26 DIAGNOSIS — Z0000 Encounter for general adult medical examination without abnormal findings: Secondary | ICD-10-CM | POA: Diagnosis not present

## 2023-12-26 LAB — URINALYSIS, ROUTINE W REFLEX MICROSCOPIC
Bilirubin Urine: NEGATIVE
Hgb urine dipstick: NEGATIVE
Ketones, ur: NEGATIVE
Leukocytes,Ua: NEGATIVE
Nitrite: NEGATIVE
RBC / HPF: NONE SEEN (ref 0–?)
Specific Gravity, Urine: 1.015 (ref 1.000–1.030)
Total Protein, Urine: NEGATIVE
Urine Glucose: NEGATIVE
Urobilinogen, UA: 0.2 (ref 0.0–1.0)
pH: 6 (ref 5.0–8.0)

## 2023-12-26 LAB — CBC WITH DIFFERENTIAL/PLATELET
Basophils Absolute: 0 K/uL (ref 0.0–0.1)
Basophils Relative: 0.4 % (ref 0.0–3.0)
Eosinophils Absolute: 0.2 K/uL (ref 0.0–0.7)
Eosinophils Relative: 3.1 % (ref 0.0–5.0)
HCT: 45 % (ref 39.0–52.0)
Hemoglobin: 15 g/dL (ref 13.0–17.0)
Lymphocytes Relative: 21.3 % (ref 12.0–46.0)
Lymphs Abs: 1.5 K/uL (ref 0.7–4.0)
MCHC: 33.3 g/dL (ref 30.0–36.0)
MCV: 86.9 fl (ref 78.0–100.0)
Monocytes Absolute: 0.7 K/uL (ref 0.1–1.0)
Monocytes Relative: 10.4 % (ref 3.0–12.0)
Neutro Abs: 4.5 K/uL (ref 1.4–7.7)
Neutrophils Relative %: 64.8 % (ref 43.0–77.0)
Platelets: 210 K/uL (ref 150.0–400.0)
RBC: 5.19 Mil/uL (ref 4.22–5.81)
RDW: 12.9 % (ref 11.5–15.5)
WBC: 6.9 K/uL (ref 4.0–10.5)

## 2023-12-26 LAB — BASIC METABOLIC PANEL WITH GFR
BUN: 14 mg/dL (ref 6–23)
CO2: 28 meq/L (ref 19–32)
Calcium: 9.4 mg/dL (ref 8.4–10.5)
Chloride: 104 meq/L (ref 96–112)
Creatinine, Ser: 0.86 mg/dL (ref 0.40–1.50)
GFR: 89.05 mL/min (ref >60.00)
Glucose, Bld: 92 mg/dL (ref 70–99)
Potassium: 4.3 meq/L (ref 3.5–5.1)
Sodium: 137 meq/L (ref 135–145)

## 2023-12-26 LAB — HEPATIC FUNCTION PANEL
ALT: 24 U/L (ref 0–53)
AST: 24 U/L (ref 0–37)
Albumin: 4.4 g/dL (ref 3.5–5.2)
Alkaline Phosphatase: 59 U/L (ref 39–117)
Bilirubin, Direct: 0.2 mg/dL (ref 0.0–0.3)
Total Bilirubin: 0.8 mg/dL (ref 0.2–1.2)
Total Protein: 6.7 g/dL (ref 6.0–8.3)

## 2023-12-26 LAB — PSA: PSA: 0.57 ng/mL (ref 0.10–4.00)

## 2023-12-26 LAB — TSH: TSH: 1.24 u[IU]/mL (ref 0.35–5.50)

## 2023-12-26 LAB — LIPID PANEL
Cholesterol: 122 mg/dL (ref 0–200)
HDL: 56.6 mg/dL (ref >39.00)
LDL Cholesterol: 44 mg/dL (ref 0–99)
NonHDL: 65.01
Total CHOL/HDL Ratio: 2
Triglycerides: 105 mg/dL (ref 0.0–149.0)
VLDL: 21 mg/dL (ref 0.0–40.0)

## 2023-12-26 LAB — FOLATE: Folate: 18.1 ng/mL (ref >5.9)

## 2023-12-26 LAB — CK: Total CK: 285 U/L — ABNORMAL HIGH (ref 17–232)

## 2023-12-26 LAB — VITAMIN B12: Vitamin B-12: 1247 pg/mL — ABNORMAL HIGH (ref 211–911)

## 2023-12-26 MED ORDER — PITAVASTATIN CALCIUM 1 MG PO TABS: 1.0000 mg | ORAL_TABLET | Freq: Every day | ORAL | 1 refills | Status: AC

## 2023-12-26 NOTE — Patient Instructions (Signed)
 Health Maintenance, Male  Adopting a healthy lifestyle and getting preventive care are important in promoting health and wellness. Ask your health care provider about:  The right schedule for you to have regular tests and exams.  Things you can do on your own to prevent diseases and keep yourself healthy.  What should I know about diet, weight, and exercise?  Eat a healthy diet    Eat a diet that includes plenty of vegetables, fruits, low-fat dairy products, and lean protein.  Do not eat a lot of foods that are high in solid fats, added sugars, or sodium.  Maintain a healthy weight  Body mass index (BMI) is a measurement that can be used to identify possible weight problems. It estimates body fat based on height and weight. Your health care provider can help determine your BMI and help you achieve or maintain a healthy weight.  Get regular exercise  Get regular exercise. This is one of the most important things you can do for your health. Most adults should:  Exercise for at least 150 minutes each week. The exercise should increase your heart rate and make you sweat (moderate-intensity exercise).  Do strengthening exercises at least twice a week. This is in addition to the moderate-intensity exercise.  Spend less time sitting. Even light physical activity can be beneficial.  Watch cholesterol and blood lipids  Have your blood tested for lipids and cholesterol at 68 years of age, then have this test every 5 years.  You may need to have your cholesterol levels checked more often if:  Your lipid or cholesterol levels are high.  You are older than 68 years of age.  You are at high risk for heart disease.  What should I know about cancer screening?  Many types of cancers can be detected early and may often be prevented. Depending on your health history and family history, you may need to have cancer screening at various ages. This may include screening for:  Colorectal cancer.  Prostate cancer.  Skin cancer.  Lung  cancer.  What should I know about heart disease, diabetes, and high blood pressure?  Blood pressure and heart disease  High blood pressure causes heart disease and increases the risk of stroke. This is more likely to develop in people who have high blood pressure readings or are overweight.  Talk with your health care provider about your target blood pressure readings.  Have your blood pressure checked:  Every 3-5 years if you are 24-52 years of age.  Every year if you are 3 years old or older.  If you are between the ages of 60 and 72 and are a current or former smoker, ask your health care provider if you should have a one-time screening for abdominal aortic aneurysm (AAA).  Diabetes  Have regular diabetes screenings. This checks your fasting blood sugar level. Have the screening done:  Once every three years after age 66 if you are at a normal weight and have a low risk for diabetes.  More often and at a younger age if you are overweight or have a high risk for diabetes.  What should I know about preventing infection?  Hepatitis B  If you have a higher risk for hepatitis B, you should be screened for this virus. Talk with your health care provider to find out if you are at risk for hepatitis B infection.  Hepatitis C  Blood testing is recommended for:  Everyone born from [birth year] through 1965.  Anyone  with known risk factors for hepatitis C.  Sexually transmitted infections (STIs)  You should be screened each year for STIs, including gonorrhea and chlamydia, if:  You are sexually active and are younger than 68 years of age.  You are older than 68 years of age and your health care provider tells you that you are at risk for this type of infection.  Your sexual activity has changed since you were last screened, and you are at increased risk for chlamydia or gonorrhea. Ask your health care provider if you are at risk.  Ask your health care provider about whether you are at high risk for HIV. Your health care provider  may recommend a prescription medicine to help prevent HIV infection. If you choose to take medicine to prevent HIV, you should first get tested for HIV. You should then be tested every 3 months for as long as you are taking the medicine.  Follow these instructions at home:  Alcohol use  Do not drink alcohol if your health care provider tells you not to drink.  If you drink alcohol:  Limit how much you have to 0-2 drinks a day.  Know how much alcohol is in your drink. In the [place] one drink equals one 12 oz bottle of beer (355 mL), one 5 oz glass of wine (148 mL), or one 1 oz glass of hard liquor (44 mL).  Lifestyle  Do not use any products that contain nicotine or tobacco. These products include cigarettes, chewing tobacco, and vaping devices, such as e-cigarettes. If you need help quitting, ask your health care provider.  Do not use street drugs.  Do not share needles.  Ask your health care provider for help if you need support or information about quitting drugs.  General instructions  Schedule regular health, dental, and eye exams.  Stay current with your vaccines.  Tell your health care provider if:  You often feel depressed.  You have ever been abused or do not feel safe at home.  Summary  Adopting a healthy lifestyle and getting preventive care are important in promoting health and wellness.  Follow your health care provider's instructions about healthy diet, exercising, and getting tested or screened for diseases.  Follow your health care provider's instructions on monitoring your cholesterol and blood pressure.  This information is not intended to replace advice given to you by your health care provider. Make sure you discuss any questions you have with your health care provider.  Document Revised: 06/08/2020 Document Reviewed: 06/08/2020  Elsevier Patient Education  2024 [employer].

## 2023-12-26 NOTE — Progress Notes (Signed)
 Subjective:  Patient ID: Harry Nash, male    DOB: 1955-11-20  Age: 68 y.o. MRN: [id number]  CC: Annual Exam, Hyperlipidemia, and Coronary Artery Disease   HPI Harry Nash presents for a CPX and f/up --  Discussed the use of AI scribe software for clinical note transcription with the patient, who gave verbal consent to proceed.  History of Present Illness Harry Nash is a 68 year old male with coronary artery disease who presents for a follow-up visit.  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 He is less active and unable to keep up with his child on the playground, recognizing the need for more physical activity.  He underwent a cardiac procedure in February, which he believes was beneficial. He experiences no chest pain or shortness of breath during physical activity. He is currently taking Plavix , which causes significant bleeding and bruising, and keeps Band-Aids handy for minor cuts and scratches. He also takes pitavastatin  and Repatha  for cholesterol management. He experienced leg spasms with a different statin and discontinued it, opting to remain on pitavastatin . He hasn't had a cholesterol test in over a year but believes his cholesterol levels were good at his last check.  He reports that he does get up at night to urinate. His last colonoscopy was approximately two years ago, during which polyps were removed.  He has received both doses of the shingles vaccine and believes he had a flu shot this year, though he plans to verify this with [facility]. He also received a COVID vaccine at [facility] and needs to check the dates.  He reports a good mood and a positive family life, with no current use of antidepressants. He previously took Trintellix  but has since stopped, feeling he was on too many medications. His current medications include aspirin ,  Plavix , Repatha , B12, and pitavastatin .  He reports a tendon tear in his elbow since April, which has been bothersome.   Outpatient Medications Prior to Visit  Medication Sig Dispense Refill   aspirin  EC 81 MG tablet Take 1 tablet (81 mg total) by mouth daily. Swallow whole. 90 tablet 3   clopidogrel  (PLAVIX ) 75 MG tablet TAKE 1 TABLET (75 MG) BY MOUTH DAILY 90 tablet 1   REPATHA  SURECLICK 140 MG/ML SOAJ Inject 140 mg into the skin every 14 (fourteen) days. 6 mL 3   Pitavastatin  Calcium  1 MG TABS Take 1 tablet (1 mg total) by mouth daily. 90 tablet 0   buPROPion  (WELLBUTRIN  XL) 150 MG 24 hr tablet Take 1 tablet (150 mg total) by mouth daily. 90 tablet 1   ezetimibe  (ZETIA ) 10 MG tablet Take 1 tablet (10 mg total) by mouth daily. (Patient not taking: Reported on 12/26/2023) 90 tablet 3   metoprolol  tartrate (LOPRESSOR ) 25 MG tablet Take 0.5 tablets (12.5 mg total) by mouth 2 (two) times daily as needed. For heart rate >100 30 tablet 3   vortioxetine  HBr (TRINTELLIX ) 5 MG TABS tablet Take 1 tablet (5 mg total) by mouth daily. 90 tablet 1   No facility-administered medications prior to visit.    ROS Review of Systems  Constitutional:  Negative for appetite change, chills, diaphoresis, fatigue and fever.  HENT: Negative.    Eyes: Negative.   Respiratory:  Negative for apnea, cough, choking, shortness of breath and wheezing.   Cardiovascular:  Negative for chest pain, palpitations and leg swelling.  Gastrointestinal: Negative.  Negative for abdominal pain, constipation, diarrhea, nausea and vomiting.  Endocrine: Negative.   Genitourinary: Negative.  Negative for difficulty urinating, dysuria, penile pain, penile swelling, scrotal swelling and testicular pain.  Musculoskeletal: Negative.  Negative for arthralgias and myalgias.  Skin: Negative.  Negative for color change and rash.  Neurological:  Negative for dizziness, weakness and light-headedness.  Hematological:  Negative for adenopathy.  Does not bruise/bleed easily.  Psychiatric/Behavioral: Negative.  Negative for decreased concentration, dysphoric mood and suicidal ideas. The patient is not nervous/anxious.     Objective:  BP 114/84   Pulse 95   Temp 98.1 F (36.7 C) (Oral)   Ht 5' 9.25 (1.759 m)   Wt 194 lb 3.2 oz (88.1 kg)   SpO2 94%   BMI 28.47 kg/m   BP Readings from Last 3 Encounters:  12/26/23 114/84  12/04/23 (!) 128/90  10/04/23 102/64    Wt Readings from Last 3 Encounters:  12/26/23 194 lb 3.2 oz (88.1 kg)  12/04/23 195 lb (88.5 kg)  10/04/23 196 lb (88.9 kg)    Physical Exam Vitals reviewed.  Constitutional:      Appearance: Normal appearance.  HENT:     Nose: Nose normal.     Mouth/Throat:     Mouth: Mucous membranes are moist.  Eyes:     General: No scleral icterus.    Conjunctiva/sclera: Conjunctivae normal.  Cardiovascular:     Rate and Rhythm: Normal rate and regular rhythm.     Heart sounds: No murmur heard.    No friction rub. No gallop.     Comments: EKG--- NSR, 85 bpm Anterior infarct pattern is not new No LVH Unchanged  Pulmonary:     Effort: Pulmonary effort is normal.     Breath sounds: No stridor. No wheezing, rhonchi or rales.  Abdominal:     General: Abdomen is flat. Bowel sounds are normal. There is no distension.     Palpations: There is no mass.     Tenderness: There is no abdominal tenderness. There is no guarding.     Hernia: No hernia is present. There is no hernia in the left inguinal area or right inguinal area.  Genitourinary:    Pubic Area: No rash.      Penis: Normal and circumcised.      Testes: Normal.        Right: Mass, tenderness or swelling not present.        Left: Mass, tenderness or swelling not present.     Epididymis:     Right: Normal.     Left: Normal.     Prostate: Enlarged. Not tender and no nodules present.     Rectum: Normal. Guaiac result negative. No mass, tenderness, anal fissure, external hemorrhoid or internal hemorrhoid.  Normal anal tone.  Musculoskeletal:        General: Normal range of motion.     Cervical back: Neck supple.     Right lower leg: No edema.     Left lower leg: No edema.  Lymphadenopathy:     Cervical: No cervical adenopathy.     Lower Body: No right inguinal adenopathy. No left inguinal adenopathy.  Skin:    General: Skin is warm and dry.  Neurological:     General: No focal deficit present.     Mental Status: He is alert. Mental status is at baseline.  Psychiatric:        Mood and Affect: Mood normal.  Behavior: Behavior normal.     Lab Results  Component Value Date   WBC 6.9 12/26/2023   HGB 15.0 12/26/2023   HCT 45.0 12/26/2023   PLT 210.0 12/26/2023   GLUCOSE 92 12/26/2023   CHOL 122 12/26/2023   TRIG 105.0 12/26/2023   HDL 56.60 12/26/2023   LDLDIRECT 60 03/09/2023   LDLCALC 44 12/26/2023   ALT 24 12/26/2023   AST 24 12/26/2023   NA 137 12/26/2023   K 4.3 12/26/2023   CL 104 12/26/2023   CREATININE 0.86 12/26/2023   BUN 14 12/26/2023   CO2 28 12/26/2023   TSH 1.24 12/26/2023   PSA 0.57 12/26/2023   INR 1.1 05/07/2019   HGBA1C 5.3 05/07/2019    CT Angio Chest PE W and/or Wo Contrast Result Date: 03/18/2023 CLINICAL DATA:  Pulmonary embolism (PE) suspected, high prob EXAM: CT ANGIOGRAPHY CHEST WITH CONTRAST TECHNIQUE: Multidetector CT imaging of the chest was performed using the standard protocol during bolus administration of intravenous contrast. Multiplanar CT image reconstructions and MIPs were obtained to evaluate the vascular anatomy. RADIATION DOSE REDUCTION: This exam was performed according to the departmental dose-optimization program which includes automated exposure control, adjustment of the mA and/or kV according to patient size and/or use of iterative reconstruction technique. CONTRAST:  75mL OMNIPAQUE  IOHEXOL  350 MG/ML SOLN COMPARISON:  02/05/2022, 10/18/2016 FINDINGS: Cardiovascular: Satisfactory opacification of the pulmonary arteries to the  segmental level. No evidence of pulmonary embolism. Thoracic aorta is nonaneurysmal. Aortic and coronary artery atherosclerosis. Normal heart size. No pericardial effusion. Mediastinum/Nodes: No enlarged mediastinal, hilar, or axillary lymph nodes. Thyroid  gland, trachea, and esophagus demonstrate no significant findings. Lungs/Pleura: Minimal dependent bibasilar atelectasis. Lungs are otherwise clear. No pleural effusion or pneumothorax. Upper Abdomen: Stable 2.4 cm left adrenal gland nodule with internal density of 18 HU. Colonic diverticulosis. No acute findings. Musculoskeletal: No chest wall abnormality. No acute or significant osseous findings. Review of the MIP images confirms the above findings. IMPRESSION: 1. No evidence of pulmonary embolism or other acute intrathoracic findings. 2. Aortic and coronary artery atherosclerosis (ICD10-I70.0). 3. Colonic diverticulosis. 4. Stable 2.4 cm left adrenal gland nodule, likely benign adenoma. No follow-up imaging recommended. Electronically Signed   By: Mabel Converse D.O.   On: 03/18/2023 20:42   DG Chest Port 1 View Result Date: 03/18/2023 CLINICAL DATA:  Chest pain.  Stent placement 03/15/2023 EXAM: PORTABLE CHEST 1 VIEW COMPARISON:  Chest x-ray 10/02/2020 FINDINGS: The heart and mediastinal contours are within normal limits. No focal consolidation. No pulmonary edema. No pleural effusion. No pneumothorax. No acute osseous abnormality. IMPRESSION: No active disease. Electronically Signed   By: Morgane  Naveau M.D.   On: 03/18/2023 19:12    Assessment & Plan:  Encounter for general adult medical examination with abnormal findings- Exam completed, labs reviewed, vaccines reviewed, cancer screenings addressed, pt ed material was given.   Dyslipidemia, goal LDL below 70- LDL goal achieved. Doing well on the statin  -     Lipid panel; Future -     TSH; Future -     Hepatic function panel; Future -     CK; Future -     Basic metabolic panel with GFR;  Future -     Pitavastatin  Calcium ; Take 1 tablet (1 mg total) by mouth daily.  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  Dispense: 90 tablet; Refill: 1  Benign prostatic hyperplasia without lower urinary tract symptoms -     Urinalysis, Routine w reflex microscopic; Future -     PSA; Future  Benign neoplasm of colon, unspecified part of colon -     Ambulatory referral to Gastroenterology     Follow-up: Return in about 6 months (around 06/24/2024).  Debby Molt, MD

## 2024-01-08 ENCOUNTER — Encounter: Payer: Self-pay | Admitting: Family Medicine

## 2024-01-09 ENCOUNTER — Encounter (HOSPITAL_BASED_OUTPATIENT_CLINIC_OR_DEPARTMENT_OTHER): Payer: Self-pay | Admitting: Cardiology

## 2024-01-09 ENCOUNTER — Ambulatory Visit (INDEPENDENT_AMBULATORY_CARE_PROVIDER_SITE_OTHER): Admitting: Cardiology

## 2024-01-09 VITALS — BP 122/74 | HR 84 | Ht 69.25 in | Wt 197.7 lb

## 2024-01-09 DIAGNOSIS — I2510 Atherosclerotic heart disease of native coronary artery without angina pectoris: Secondary | ICD-10-CM

## 2024-01-09 DIAGNOSIS — Z955 Presence of coronary angioplasty implant and graft: Secondary | ICD-10-CM | POA: Insufficient documentation

## 2024-01-09 DIAGNOSIS — Z8673 Personal history of transient ischemic attack (TIA), and cerebral infarction without residual deficits: Secondary | ICD-10-CM

## 2024-01-09 DIAGNOSIS — E782 Mixed hyperlipidemia: Secondary | ICD-10-CM

## 2024-01-09 DIAGNOSIS — Z7189 Other specified counseling: Secondary | ICD-10-CM

## 2024-01-09 NOTE — Progress Notes (Signed)
 Cardiology Office Note:  .   Date:  01/09/2024  ID:  Harry Nash, DOB [birth year], MRN [id number] PCP: [name], MD  [facility] Providers Cardiologist:  Shelda Bruckner, MD {  History of Present Illness: .   Harry Nash is a 68 y.o. male with hx of CAD s/p PCI to LAD and RCA 03/2023, mixed hyperlipidemia, TIA who is seen today for follow-up. He was initially seen 02/25/19 as a new consult at the request of Hensel, Elsie LABOR, MD for the evaluation and management of cardiovascular risk.   Pertinent CV history: Echo for TIA in February 10, 2020 with possible late positive bubble study, otherwise unremarkable. Presented with palpitations/syncope in February 10, 2023, echo 11/2022 with normal LVEF, G1DD, mild AI. Carotids with 1-39% stenosis RICA, 40-59% stenosis LICA. 30 day monitor without high risk findings. Ca score with total score 658 (83rd %ile). He then underwent coronary CT 03/08/23 which showed TPV 593 (severe), proximal LAD with 70-99% stenosis, ostial LCX with 50-69% stenosis, RCA with mid stenosis of 25-49%. He underwent cath 03/15/23 which showed severe diffuse calcific LAD disease in proximal vessel treated with 3.0 x 38 mm synergy DES, severe focal mid RCA stenosis of 75% treated with 3.5 x 16 mm synergy stent. No significant obstructive disease in LM or Lcx.  Family history (from 10-Feb-2020): 6 siblings. Father died of MI at age 57. Mother had 4V CABG in her 20s, lived to be 82. Oldest brother had valve replacement, multiple bypasses. He is 15 years older, done 8-10 years ago, was in his late 56s at the time. Next brother had MI about 1.5 years ago in [place], had a stent placed. Oldest sister unknown heart history, Next brother had 2V bypass, mitral valve repair last month (age 81). Next sister had heart racing, now on medication. Two siblings are unknown history.   Today: Interim history since our last visit in February 09, 2022 reviewed. After his cath in 03/2023, he felt much better after stent. He does  all of the household duties and has young children (42 year old is homeschooled), doesn't have much time for exercise. 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 He can push himself to physical activity when needed, no limitations.   No longer noticing routine palpitations. Now infrequent, nonlimiting, brief.  ROS: Denies chest pain, shortness of breath at rest or with normal exertion. No PND, orthopnea, LE edema or unexpected weight gain. No syncope. ROS otherwise negative except as noted.   Studies Reviewed: SABRA    EKG:       Physical Exam:   VS:  BP 122/74   Pulse 84   Ht 5' 9.25 (1.759 m)   Wt 197 lb 11.2 oz (89.7 kg)   SpO2 96%   BMI 28.98 kg/m    Wt Readings from Last 3 Encounters:  01/09/24 197 lb 11.2 oz (89.7 kg)  12/26/23 194 lb 3.2 oz (88.1 kg)  12/04/23 195 lb (88.5 kg)    GEN: Well nourished, well developed in no acute distress HEENT: Normal, moist mucous membranes NECK: No JVD CARDIAC: regular rhythm, normal S1 and S2, no rubs or gallops. No murmur. VASCULAR: Radial and DP pulses 2+ bilaterally. No carotid bruits RESPIRATORY:  Clear to auscultation without rales, wheezing or rhonchi  ABDOMEN: Soft, non-tender, non-distended MUSCULOSKELETAL:  Ambulates independently SKIN: Warm and dry, no edema NEUROLOGIC:  Alert and oriented x 3. No focal neuro deficits noted. PSYCHIATRIC:  Normal affect  ASSESSMENT AND PLAN: .    CAD s/p PCI to LAD and RCA 03/2023 History of TIA Mixed hyperlipidemia Strong family history of heart disease -did not tolerate rosuvastatin  due to myalgia. Currently on pitavastatin  and repatha  -planned for aspirin  and clopidogrel  for 12 mos, then aspirin  alone -most recent lipids 12/26/23 with TG 105, LDL 44 -has had chronically elevated CK -discussed checking lp(a), he is amenable   Palpitations: much improved  CV risk counseling and prevention -recommend heart healthy/Mediterranean diet, with  whole grains, fruits, vegetable, fish, lean meats, nuts, and olive oil. Limit salt. -recommend moderate walking, 3-5 times/week for 30-50 minutes each session. Aim for at least 150 minutes/week. Goal should be pace of 3 miles/hours, or walking 1.5 miles in 30 minutes -recommend avoidance of tobacco products. Avoid excess alcohol.  Dispo: 6 months or sooner as needed  Signed, Shelda Bruckner, MD   Shelda Bruckner, MD, PhD, [facility]  [facility]  [place]  Heart & Vascular at [facility] at [facility] [address], [address] [place], [place] [address] [phone]

## 2024-01-09 NOTE — Patient Instructions (Signed)
 Medication Instructions:  No changes *If you need a refill on your cardiac medications before your next appointment, please call your pharmacy*  Lab Work: none If you have labs (blood work) drawn today and your tests are completely normal, you will receive your results only by: MyChart Message (if you have MyChart) OR A paper copy in the mail If you have any lab test that is abnormal or we need to change your treatment, we will call you to review the results.  Testing/Procedures: none  Follow-Up: At [facility], you and your health needs are our priority.  As part of our continuing mission to provide you with exceptional heart care, our providers are all part of one team.  This team includes your primary Cardiologist (physician) and Advanced Practice Providers or APPs (Physician Assistants and Nurse Practitioners) who all work together to provide you with the care you need, when you need it.  Your next appointment:   6 month(s)  Provider:   Shelda Bruckner, MD, Rosaline Bane, NP, or Reche Finder, NP

## 2024-01-11 ENCOUNTER — Other Ambulatory Visit: Payer: Self-pay | Admitting: Cardiology

## 2024-01-11 DIAGNOSIS — Z8673 Personal history of transient ischemic attack (TIA), and cerebral infarction without residual deficits: Secondary | ICD-10-CM

## 2024-01-11 DIAGNOSIS — E7800 Pure hypercholesterolemia, unspecified: Secondary | ICD-10-CM

## 2024-01-17 NOTE — Progress Notes (Unsigned)
 [facility] [address] [place] [address] Phone: [phone] Subjective:   Harry Nash Harry Nash, am serving as a scribe for Dr. Arthea Nash.  I'm seeing this patient by the request  of:  Harry Debby CROME, MD  CC: Right elbow pain follow-up  YEP:Dlagzrupcz  12/04/2023 R elbow pain.  Updated 01/18/2024 Harry Nash is a 68 y.o. male coming in with complaint of R elbow pain. Patient states that he flared up elbow lifting heavier items. Pain over lateral epicondyle.   L great toe MTP has burning for past few weeks. Pain is constant. No MOI.       Past Medical History:  Diagnosis Date   Depression    Headache(784.0)    Kidney stones    TIA (transient ischemic attack)    Past Surgical History:  Procedure Laterality Date   CORONARY STENT INTERVENTION N/A 03/15/2023   Procedure: CORONARY STENT INTERVENTION;  Surgeon: Wonda Sharper, MD;  [facility]: [facility]VAS[facility] CV LAB;  Service: Cardiovascular;  Laterality: N/A;   CORONARY ULTRASOUND/IVUS N/A 03/15/2023   Procedure: Coronary Ultrasound/IVUS;  Surgeon: Wonda Sharper, MD;  Location: [facility] INVASIVE CV LAB;  Service: Cardiovascular;  Laterality: N/A;   HERNIA REPAIR     LEFT HEART CATH AND CORONARY ANGIOGRAPHY N/A 03/15/2023   Procedure: LEFT HEART CATH AND CORONARY ANGIOGRAPHY;  Surgeon: Wonda Sharper, MD;  Location: [facility] INVASIVE CV LAB;  Service: Cardiovascular;  Laterality: N/A;   ROTATOR CUFF REPAIR     Social History   Socioeconomic History   Marital status: Married    Spouse name: Not on file   Number of children: 3   Years of education: Not on file   Highest education level: Master's degree (e.g., MA, MS, MEng, MEd, MSW, MBA)  Occupational History   Not on file  Tobacco Use   Smoking status: Former    Current packs/day: 0.00    Types: Cigarettes    Quit date: 02/01/2003    Years since quitting: 20.9   Smokeless tobacco: Never  Vaping Use   Vaping status: Never Used  Substance  and Sexual Activity   Alcohol use: Yes    Alcohol/week: 1.0 standard drink of alcohol    Types: 1 Cans of beer per week    Comment: moderate   Drug use: No   Sexual activity: Yes  Other Topics Concern   Not on file  Social History Narrative   Not on file   Social Drivers of Health   Tobacco Use: Medium Risk (01/09/2024)   Patient History    Smoking Tobacco Use: Former    Smokeless Tobacco Use: Never    Passive Exposure: Not on file  Financial Resource Strain: Low Risk (12/22/2023)   Overall Financial Resource Strain (CARDIA)    Difficulty of Paying Living Expenses: Not very hard  Food Insecurity: No Food Insecurity (12/22/2023)   Epic    Worried About Programme Researcher, Broadcasting/film/video in the Last Year: Never true    Ran Out of Food in the Last Year: Never true  Transportation Needs: No Transportation Needs (12/22/2023)   Epic    Lack of Transportation (Medical): No    Lack of Transportation (Non-Medical): No  Physical Activity: Unknown (12/22/2023)   Exercise Vital Sign    Days of Exercise per Week: Patient declined    Minutes of Exercise per Session: Not on file  Stress: No Stress Concern Present (12/22/2023)   Harry Nash  Feeling of Stress: Only a little  Social Connections: Unknown (12/22/2023)   Social Connection and Isolation Panel    Frequency of Communication with Friends and Family: Patient declined    Frequency of Social Gatherings with Friends and Family: Patient declined    Attends Religious Services: Patient declined    Active Member of Clubs or Organizations: No    Attends Engineer, Structural: Not on file    Marital Status: Married  Depression (PHQ2-9): Low Risk (02/16/2023)   Depression (PHQ2-9)    PHQ-2 Score: 0  Alcohol Screen: Low Risk (12/22/2023)   Alcohol Screen    Last Alcohol Screening Score (AUDIT): 1  Housing: Unknown (12/22/2023)   Epic    Unable to Pay for Housing in the Last  Year: No    Number of Times Moved in the Last Year: Not on file    Homeless in the Last Year: No  Utilities: Not on file  Health Literacy: Not on file   Allergies[1] Family History  Problem Relation Age of Onset   Heart disease Mother    Heart disease Father    Hypertension Father    Heart disease Sister    Heart disease Sister    Heart disease Brother    Heart disease Brother     Current Outpatient Medications (Cardiovascular):    Pitavastatin  Calcium  1 MG TABS, Take 1 tablet (1 mg total) by mouth daily.   REPATHA  SURECLICK 140 MG/ML SOAJ, Inject 140 mg into the skin every 14 (fourteen) days.  Current Outpatient Medications (Analgesics):    aspirin  EC 81 MG tablet, Take 1 tablet (81 mg total) by mouth daily. Swallow whole.  Current Outpatient Medications (Hematological):    clopidogrel  (PLAVIX ) 75 MG tablet, TAKE 1 TABLET (75 MG) BY MOUTH DAILY   Reviewed prior external information including notes and imaging from  primary care provider As well as notes that were available from care everywhere and other healthcare systems.  Past medical history, social, surgical and family history all reviewed in electronic medical record.  No pertanent information unless stated regarding to the chief complaint.   Review of Systems:  No headache, visual changes, nausea, vomiting, diarrhea, constipation, dizziness, abdominal pain, skin rash, fevers, chills, night sweats, weight loss, swollen lymph nodes, body aches, joint swelling, chest pain, shortness of breath, mood changes. POSITIVE muscle aches  Objective  Blood pressure (!) 112/54, pulse (!) 112, height 5' 9 (1.753 m), weight 197 lb (89.4 kg), SpO2 98%.   General: No apparent distress alert and oriented x3 mood and affect normal, dressed appropriately.  HEENT: Pupils equal, extraocular movements intact  Respiratory: Patient's speak in full sentences and does not appear short of breath  Cardiovascular: No lower extremity edema, non  tender, no erythema  Right elbow does have tenderness to palpation over the lateral epicondylar area.  Worsening pain with any type of extension of the wrist against resistance. Left toe does have mild bunion formation noted.  Some limited range of motion.  Some redness to the joint  Limited muscular skeletal ultrasound was performed and interpreted by Harry Nash, M  Limited ultrasound of patient's right elbow shows that there is a cortical irregularity with increasing in Doppler flow and neovascularization on the most lateral aspect of the lateral epicondylar area.  Significant hypoechoic changes noted of the common extensor tendon at its origin cannot see acute tear appreciated at this time.   Impression and Recommendations:    The above documentation has been reviewed and is  accurate and complete Harry CHRISTELLA Sharps, DO        [1]  Allergies Allergen Reactions   Gabapentin  Other (See Comments)    Clouded mentation   Rosuvastatin  Other (See Comments)    myalgias

## 2024-01-18 ENCOUNTER — Encounter: Payer: Self-pay | Admitting: Family Medicine

## 2024-01-18 ENCOUNTER — Ambulatory Visit: Admitting: Family Medicine

## 2024-01-18 ENCOUNTER — Other Ambulatory Visit: Payer: Self-pay

## 2024-01-18 VITALS — BP 112/54 | HR 112 | Ht 69.0 in | Wt 197.0 lb

## 2024-01-18 DIAGNOSIS — M25521 Pain in right elbow: Secondary | ICD-10-CM | POA: Diagnosis not present

## 2024-01-18 DIAGNOSIS — S56511A Strain of other extensor muscle, fascia and tendon at forearm level, right arm, initial encounter: Secondary | ICD-10-CM | POA: Diagnosis not present

## 2024-01-18 DIAGNOSIS — M7752 Other enthesopathy of left foot: Secondary | ICD-10-CM | POA: Diagnosis not present

## 2024-01-18 NOTE — Assessment & Plan Note (Signed)
 Capsulitis of the left toe with mild arthritic changes.  Discussed a carbon fiber plate.  Icing regimen, discussed which activities to do and which ones to avoid.  Increase activity slowly.  Follow-up again in 6 to 12 weeks worsening pain consider injection

## 2024-01-18 NOTE — Patient Instructions (Signed)
 MRI [facility] Carbon fiber plate  Recovery sandals in house Will contact you with MRI results

## 2024-01-18 NOTE — Assessment & Plan Note (Signed)
 Do not see any acute tearing at this moment.  Discussed icing regimen and home exercises though the patient has failed formal physical therapy as well as a steroid injection at this point.  Affecting daily activities.  With concern with some cortical irregularity noted on ultrasound today.  Will get MRI to further evaluate and see if patient would be a surgical candidate or potentially a candidate for PRP.

## 2024-01-28 ENCOUNTER — Ambulatory Visit

## 2024-01-28 DIAGNOSIS — M25521 Pain in right elbow: Secondary | ICD-10-CM | POA: Diagnosis not present

## 2024-01-31 NOTE — Progress Notes (Signed)
 " [facility] [address] [place] [address] Phone: [phone] Subjective:   [name], am serving as a scribe for Dr. Arthea Claudene.  I'm seeing this patient by the request  of:  [name], MD  CC: Right elbow follow-up  YEP:Dlagzrupcz  01/18/2024 Capsulitis of the left toe with mild arthritic changes.  Discussed a carbon fiber plate.  Icing regimen, discussed which activities to do and which ones to avoid.  Increase activity slowly.  Follow-up again in 6 to 12 weeks worsening pain consider injection     Do not see any acute tearing at this moment.  Discussed icing regimen and home exercises though the patient has failed formal physical therapy as well as a steroid injection at this point.  Affecting daily activities.  With concern with some cortical irregularity noted on ultrasound today.  Will get MRI to further evaluate and see if patient would be a surgical candidate or potentially a candidate for PRP.      Update 02/06/2023 [name] is a 68 y.o. male coming in with complaint of R elbow and L foot pain. Patient states that he is doing the same as last visit.   MRI R elbow 01/28/2024     Past Medical History:  Diagnosis Date   Depression    Headache(784.0)    Kidney stones    TIA (transient ischemic attack)    Past Surgical History:  Procedure Laterality Date   CORONARY STENT INTERVENTION N/A 03/15/2023   Procedure: CORONARY STENT INTERVENTION;  Surgeon: Wonda Sharper, MD;  Location: [facility] INVASIVE CV LAB;  Service: Cardiovascular;  Laterality: N/A;   CORONARY ULTRASOUND/IVUS N/A 03/15/2023   Procedure: Coronary Ultrasound/IVUS;  Surgeon: Wonda Sharper, MD;  Location: [facility] INVASIVE CV LAB;  Service: Cardiovascular;  Laterality: N/A;   HERNIA REPAIR     LEFT HEART CATH AND CORONARY ANGIOGRAPHY N/A 03/15/2023   Procedure: LEFT HEART CATH AND CORONARY ANGIOGRAPHY;  Surgeon: Wonda Sharper, MD;  Location: [facility] INVASIVE CV  LAB;  Service: Cardiovascular;  Laterality: N/A;   ROTATOR CUFF REPAIR     Social History   Socioeconomic History   Marital status: Married    Spouse name: Not on file   Number of children: 3   Years of education: Not on file   Highest education level: Master's degree (e.g., MA, MS, MEng, MEd, MSW, MBA)  Occupational History   Not on file  Tobacco Use   Smoking status: Former    Current packs/day: 0.00    Types: Cigarettes    Quit date: 02/01/2003    Years since quitting: 21.0   Smokeless tobacco: Never  Vaping Use   Vaping status: Never Used  Substance and Sexual Activity   Alcohol use: Yes    Alcohol/week: 1.0 standard drink of alcohol    Types: 1 Cans of beer per week    Comment: moderate   Drug use: No   Sexual activity: Yes  Other Topics Concern   Not on file  Social History Narrative   Not on file   Social Drivers of Health   Tobacco Use: Medium Risk (01/18/2024)   Patient History    Smoking Tobacco Use: Former    Smokeless Tobacco Use: Never    Passive Exposure: Not on file  Financial Resource Strain: Low Risk (12/22/2023)   Overall Financial Resource Strain (CARDIA)    Difficulty of Paying Living Expenses: Not very hard  Food Insecurity: No Food Insecurity (12/22/2023)  Epic    Worried About Programme Researcher, Broadcasting/film/video in the Last Year: Never true    [employer] of Food in the Last Year: Never true  Transportation Needs: No Transportation Needs (12/22/2023)   Epic    Lack of Transportation (Medical): No    Lack of Transportation (Non-Medical): No  Physical Activity: Unknown (12/22/2023)   Exercise Vital Sign    Days of Exercise per Week: Patient declined    Minutes of Exercise per Session: Not on file  Stress: No Stress Concern Present (12/22/2023)   [employer] of Occupational Health - Occupational Stress Questionnaire    Feeling of Stress: Only a little  Social Connections: Unknown (12/22/2023)   Social Connection and Isolation Panel    Frequency of  Communication with Friends and Family: Patient declined    Frequency of Social Gatherings with Friends and Family: Patient declined    Attends Religious Services: Patient declined    Active Member of Clubs or Organizations: No    Attends Engineer, Structural: Not on file    Marital Status: Married  Depression (PHQ2-9): Low Risk (02/16/2023)   Depression (PHQ2-9)    PHQ-2 Score: 0  Alcohol Screen: Low Risk (12/22/2023)   Alcohol Screen    Last Alcohol Screening Score (AUDIT): 1  Housing: Unknown (12/22/2023)   Epic    Unable to Pay for Housing in the Last Year: No    Number of Times Moved in the Last Year: Not on file    Homeless in the Last Year: No  Utilities: Not on file  Health Literacy: Not on file   Allergies[1] Family History  Problem Relation Age of Onset   Heart disease Mother    Heart disease Father    Hypertension Father    Heart disease Sister    Heart disease Sister    Heart disease Brother    Heart disease Brother     Current Outpatient Medications (Cardiovascular):    Pitavastatin  Calcium  1 MG TABS, Take 1 tablet (1 mg total) by mouth daily.   REPATHA  SURECLICK 140 MG/ML SOAJ, Inject 140 mg into the skin every 14 (fourteen) days.  Current Outpatient Medications (Analgesics):    aspirin  EC 81 MG tablet, Take 1 tablet (81 mg total) by mouth daily. Swallow whole.  Current Outpatient Medications (Hematological):    clopidogrel  (PLAVIX ) 75 MG tablet, TAKE 1 TABLET (75 MG) BY MOUTH DAILY   Objective  Blood pressure 138/78, pulse 87, height 5' 9 (1.753 m), weight 197 lb (89.4 kg), SpO2 96%.   General: No apparent distress alert and oriented x3 mood and affect normal, dressed appropriately.  HEENT: Pupils equal, extraocular movements intact  Respiratory: Patient's speak in full sentences and does not appear short of breath  Cardiovascular: No lower extremity edema, non tender, no erythema  Right elbow exam shows still tenderness noted.  Patient's left  foot does show some hallux limitus noted of the left foot.  Mild swelling over the first MTP.  No breakdown of the transverse arch noted.  Limited muscular skeletal ultrasound was performed and interpreted by CLAUDENE HUSSAR, M  Limited ultrasound of patient's first MTP shows some hypoechoic changes but does seem to be less than previous ultrasound.  Seems to have even some mild loose bodies noted. Impression: Continued capsulitis with some interval improvement    Impression and Recommendations:    The above documentation has been reviewed and is accurate and complete Aydia Maj M Cy Bresee, DO        [  1]  Allergies Allergen Reactions   Gabapentin  Other (See Comments)    Clouded mentation   Rosuvastatin  Other (See Comments)    myalgias   "

## 2024-02-06 ENCOUNTER — Ambulatory Visit: Admitting: Family Medicine

## 2024-02-06 ENCOUNTER — Encounter: Payer: Self-pay | Admitting: Family Medicine

## 2024-02-06 ENCOUNTER — Other Ambulatory Visit: Payer: Self-pay

## 2024-02-06 VITALS — BP 138/78 | HR 87 | Ht 69.0 in | Wt 197.0 lb

## 2024-02-06 DIAGNOSIS — M25521 Pain in right elbow: Secondary | ICD-10-CM

## 2024-02-06 DIAGNOSIS — S56511A Strain of other extensor muscle, fascia and tendon at forearm level, right arm, initial encounter: Secondary | ICD-10-CM

## 2024-02-06 DIAGNOSIS — M7752 Other enthesopathy of left foot: Secondary | ICD-10-CM

## 2024-02-06 NOTE — Assessment & Plan Note (Signed)
 Continue capsulitis.  Patient does have some decrease in swelling but still wants to hold off on any type of injection.  Will try to use the over-the-counter orthotics in the recovery sandals at home.  Follow-up with me again 6 to 12 weeks otherwise

## 2024-02-06 NOTE — Assessment & Plan Note (Signed)
 Patient continues to have discomfort and pain.  Patient has failed injection, MRI over read is pending but interstitial tearing noted.  Will refer patient to orthopedic surgery to discuss potential surgical intervention.  Patient feels like he has failed all conservative therapy including the injection and physical therapy.

## 2024-02-06 NOTE — Patient Instructions (Signed)
 Referral to Dr. Dozier Will keep monitoring toe Try diet higher in Omega 3s See me in 2-3 months for the toe

## 2024-02-09 ENCOUNTER — Ambulatory Visit: Payer: Self-pay | Admitting: Family Medicine
